# Patient Record
Sex: Male | Born: 1955 | Race: White | Hispanic: No | Marital: Married | Smoking: Current every day smoker
Health system: Southern US, Community
[De-identification: ages and names within clinical notes are randomized; demographics above are authoritative.]

---

## 1987-01-13 DIAGNOSIS — I219 Acute myocardial infarction, unspecified: Secondary | ICD-10-CM

## 1987-01-13 HISTORY — DX: Acute myocardial infarction, unspecified: I21.9

## 1990-01-12 HISTORY — PX: VASECTOMY: SHX75

## 2017-01-12 HISTORY — PX: GALLBLADDER SURGERY: SHX652

## 2019-01-13 HISTORY — PX: FEMORAL-POPLITEAL BYPASS GRAFT: SHX937

## 2021-07-01 ENCOUNTER — Emergency Department: Payer: BC Managed Care – PPO

## 2021-07-01 ENCOUNTER — Emergency Department
Admission: EM | Admit: 2021-07-01 | Discharge: 2021-07-01 | Disposition: A | Discharge status: Home / Self Care | Payer: BC Managed Care – PPO | Attending: Emergency Medicine | Admitting: Emergency Medicine

## 2021-07-01 ENCOUNTER — Other Ambulatory Visit: Payer: Self-pay

## 2021-07-01 DIAGNOSIS — D649 Anemia, unspecified: Secondary | ICD-10-CM | POA: Diagnosis not present

## 2021-07-01 DIAGNOSIS — R778 Other specified abnormalities of plasma proteins: Secondary | ICD-10-CM | POA: Diagnosis not present

## 2021-07-01 DIAGNOSIS — R0789 Other chest pain: Secondary | ICD-10-CM | POA: Diagnosis not present

## 2021-07-01 DIAGNOSIS — I2510 Atherosclerotic heart disease of native coronary artery without angina pectoris: Secondary | ICD-10-CM | POA: Diagnosis not present

## 2021-07-01 DIAGNOSIS — J449 Chronic obstructive pulmonary disease, unspecified: Secondary | ICD-10-CM | POA: Insufficient documentation

## 2021-07-01 DIAGNOSIS — I509 Heart failure, unspecified: Secondary | ICD-10-CM | POA: Diagnosis not present

## 2021-07-01 DIAGNOSIS — R944 Abnormal results of kidney function studies: Secondary | ICD-10-CM | POA: Diagnosis not present

## 2021-07-01 DIAGNOSIS — I110 Hypertensive heart disease with heart failure: Secondary | ICD-10-CM | POA: Diagnosis not present

## 2021-07-01 LAB — BASIC METABOLIC PANEL
Anion gap: 8 (ref 5–15)
BUN: 12 mg/dL (ref 8–23)
CO2: 25 mmol/L (ref 22–32)
Calcium: 8.9 mg/dL (ref 8.9–10.3)
Chloride: 106 mmol/L (ref 98–111)
Creatinine, Ser: 1.59 mg/dL — ABNORMAL HIGH (ref 0.61–1.24)
GFR, Estimated: 48 mL/min — ABNORMAL LOW (ref >60)
Glucose, Bld: 98 mg/dL (ref 70–99)
Potassium: 4.3 mmol/L (ref 3.5–5.1)
Sodium: 139 mmol/L (ref 135–145)

## 2021-07-01 LAB — CBC
HCT: 34.5 % — ABNORMAL LOW (ref 39.0–52.0)
Hemoglobin: 11.2 g/dL — ABNORMAL LOW (ref 13.0–17.0)
MCH: 33.4 pg (ref 26.0–34.0)
MCHC: 32.5 g/dL (ref 30.0–36.0)
MCV: 103 fL — ABNORMAL HIGH (ref 80.0–100.0)
Platelets: 143 10*3/uL — ABNORMAL LOW (ref 150–400)
RBC: 3.35 MIL/uL — ABNORMAL LOW (ref 4.22–5.81)
RDW: 19 % — ABNORMAL HIGH (ref 11.5–15.5)
WBC: 6.1 10*3/uL (ref 4.0–10.5)
nRBC: 0 % (ref 0.0–0.2)

## 2021-07-01 LAB — TROPONIN I (HIGH SENSITIVITY)
Troponin I (High Sensitivity): 76 ng/L — ABNORMAL HIGH (ref <18)
Troponin I (High Sensitivity): 65 ng/L — ABNORMAL HIGH (ref <18)

## 2021-07-01 NOTE — ED Provider Notes (Signed)
[facility] Provider Note    Event Date/Time   First MD Initiated Contact with Patient 07/01/21 0845     (approximate)   History   Chief Complaint Chest Pain   HPI  Derek Oconnell is a 66 y.o. male with past medical history of hypertension, hyperlipidemia, CAD, CHF, PAD, COPD, and chronic dysphagia who presents to the ED complaining of chest pain.  Patient reports that for each of the past 2 mornings he has woken up with a tightness in the left side of his chest.  He describes the sensation as both tight and painful, rated 5 out of 10 when present.  Yesterday it lasted only for a couple of minutes before resolving, this morning it lasted for about 15 to 20 minutes before resolving.  He denies similar symptoms over the past couple of months, has not had any fevers, cough, or shortness of breath.  He has not noticed any pain or swelling in his legs and denies any abdominal pain, vomiting, or diarrhea.  He follows with [facility] cardiology, reports a recent echocardiogram that was unchanged from previous, denies any recent stenting or other intervention.  He took 324 mg of aspirin prior to arrival.     Physical Exam   Triage Vital Signs: ED Triage Vitals  Enc Vitals Group     BP 07/01/21 0832 113/87     Pulse Rate 07/01/21 0832 91     Resp 07/01/21 0832 18     Temp 07/01/21 0832 98 F (36.7 C)     Temp Source 07/01/21 0832 Oral     SpO2 07/01/21 0832 96 %     Weight 07/01/21 0834 145 lb (65.8 kg)     Height 07/01/21 0834 5\' 10"  (1.778 m)     Head Circumference --      Peak Flow --      Pain Score 07/01/21 0832 0     Pain Loc --      Pain Edu? --      Excl. in GC? --     Most recent vital signs: Vitals:   07/01/21 0832 07/01/21 1048  BP: 113/87 115/87  Pulse: 91 89  Resp: 18 18  Temp: 98 F (36.7 C)   SpO2: 96% 96%    Constitutional: Alert and oriented. Eyes: Conjunctivae are normal. Head: Atraumatic. Nose: No congestion/rhinnorhea. Mouth/Throat:  Mucous membranes are moist.  Cardiovascular: Normal rate, regular rhythm. Grossly normal heart sounds.  2+ radial pulses bilaterally. Respiratory: Normal respiratory effort.  No retractions. Lungs CTAB.  No chest wall tenderness to palpation. Gastrointestinal: Soft and nontender. No distention. Musculoskeletal: No lower extremity tenderness nor edema.  Neurologic:  Normal speech and language. No gross focal neurologic deficits are appreciated.    ED Results / Procedures / Treatments   Labs (all labs ordered are listed, but only abnormal results are displayed) Labs Reviewed  BASIC METABOLIC PANEL - Abnormal; Notable for the following components:      Result Value   Creatinine, Ser 1.59 (*)    GFR, Estimated 48 (*)    All other components within normal limits  CBC - Abnormal; Notable for the following components:   RBC 3.35 (*)    Hemoglobin 11.2 (*)    HCT 34.5 (*)    MCV 103.0 (*)    RDW 19.0 (*)    Platelets 143 (*)    All other components within normal limits  TROPONIN I (HIGH SENSITIVITY) - Abnormal; Notable for the following components:  Troponin I (High Sensitivity) 76 (*)    All other components within normal limits  TROPONIN I (HIGH SENSITIVITY) - Abnormal; Notable for the following components:   Troponin I (High Sensitivity) 65 (*)    All other components within normal limits     EKG  ED ECG REPORT I, Chesley Noon, the attending physician, personally viewed and interpreted this ECG.   Date: 07/01/2021  EKG Time: 8:33  Rate: 91  Rhythm: normal sinus rhythm, occasional PVC  Axis: Normal  Intervals:nonspecific intraventricular conduction delay  ST&T Change: Lateral ST depressions  RADIOLOGY Chest x-ray reviewed and interpreted by me with no infiltrate, edema, or effusion.  PROCEDURES:  Critical Care performed: No  Procedures   MEDICATIONS ORDERED IN ED: Medications - No data to display   IMPRESSION / MDM / ASSESSMENT AND PLAN / ED COURSE  I  reviewed the triage vital signs and the nursing notes.                              66 y.o. male with past medical history of hypertension, hyperlipidemia, CAD, CHF, PAD, COPD, and dysphagia who presents to the ED complaining of chest tightness upon waking up each of the past 2 mornings, symptoms have since resolved.  Patient's presentation is most consistent with acute presentation with potential threat to life or bodily function.  Differential diagnosis includes, but is not limited to, ACS, PE, dissection, pneumonia, pneumothorax, GERD, musculoskeletal pain, anxiety.  Patient well-appearing and in no acute distress, vital signs are unremarkable.  EKG does show slight ST depressions laterally, unfortunately no prior EKG in our system for comparison although EKG from [facility] in January does not note any depressions.  We will observe patient on cardiac monitor and screen 2 sets of troponin, although symptoms are atypical for ACS.  Chest x-ray is unremarkable and I doubt PE or dissection given resolution of his symptoms with reassuring vital signs.  CBC shows chronic anemia that is unchanged today.  Additional labs show mild elevation in troponin as well as mild elevation in creatinine, both comparable to labs obtained earlier this year at [facility].  Patient remains asymptomatic here in the ED and troponin is stable on recheck, low suspicion for ACS given his atypical symptoms.  Patient is appropriate for discharge home, was counseled to closely follow-up with his cardiology team at [facility] and to otherwise return to the ED for new worsening symptoms.  Patient agrees with plan.      FINAL CLINICAL IMPRESSION(S) / ED DIAGNOSES   Final diagnoses:  Atypical chest pain     Rx / DC Orders   ED Discharge Orders     None        Note:  This document was prepared using Dragon voice recognition software and may include unintentional dictation errors.   Chesley Noon, MD 07/01/21 [phone]

## 2021-07-01 NOTE — ED Triage Notes (Signed)
Pt here via [facility] with left side chest tightness this morning. Pt denies SOB, dizziness, or nausea. Pain does not radiate, pt took 324 ASA at home.    Hx: Mi, angioplasty  84 96% RA 1313/76 133-cbg 20G LAC

## 2021-07-01 NOTE — ED Notes (Signed)
Lt green, purple, and blue lab tube sent

## 2022-09-15 ENCOUNTER — Inpatient Hospital Stay (HOSPITAL_COMMUNITY): Payer: Medicare Other

## 2022-09-15 ENCOUNTER — Encounter (HOSPITAL_COMMUNITY): Payer: Self-pay

## 2022-09-15 ENCOUNTER — Emergency Department: Payer: Medicare Other

## 2022-09-15 ENCOUNTER — Inpatient Hospital Stay (HOSPITAL_COMMUNITY)
Admit: 2022-09-15 | Discharge: 2022-09-24 | DRG: 896 | Disposition: A | Discharge status: Rehabilitation Facility | Payer: Medicare Other | Source: Other Acute Inpatient Hospital | Attending: Internal Medicine | Admitting: Internal Medicine

## 2022-09-15 ENCOUNTER — Emergency Department
Admission: EM | Admit: 2022-09-15 | Discharge: 2022-09-15 | Disposition: A | Discharge status: Other Acute Inpatient Hospital | Payer: Medicare Other | Attending: Emergency Medicine | Admitting: Emergency Medicine

## 2022-09-15 ENCOUNTER — Other Ambulatory Visit: Payer: Self-pay

## 2022-09-15 DIAGNOSIS — G40901 Epilepsy, unspecified, not intractable, with status epilepticus: Secondary | ICD-10-CM | POA: Diagnosis present

## 2022-09-15 DIAGNOSIS — I130 Hypertensive heart and chronic kidney disease with heart failure and stage 1 through stage 4 chronic kidney disease, or unspecified chronic kidney disease: Secondary | ICD-10-CM | POA: Diagnosis present

## 2022-09-15 DIAGNOSIS — E519 Thiamine deficiency, unspecified: Secondary | ICD-10-CM | POA: Diagnosis not present

## 2022-09-15 DIAGNOSIS — F17210 Nicotine dependence, cigarettes, uncomplicated: Secondary | ICD-10-CM | POA: Diagnosis present

## 2022-09-15 DIAGNOSIS — G928 Other toxic encephalopathy: Secondary | ICD-10-CM | POA: Diagnosis present

## 2022-09-15 DIAGNOSIS — J440 Chronic obstructive pulmonary disease with acute lower respiratory infection: Secondary | ICD-10-CM | POA: Diagnosis present

## 2022-09-15 DIAGNOSIS — I69398 Other sequelae of cerebral infarction: Secondary | ICD-10-CM | POA: Diagnosis not present

## 2022-09-15 DIAGNOSIS — D539 Nutritional anemia, unspecified: Secondary | ICD-10-CM | POA: Diagnosis present

## 2022-09-15 DIAGNOSIS — R4182 Altered mental status, unspecified: Secondary | ICD-10-CM | POA: Diagnosis not present

## 2022-09-15 DIAGNOSIS — Z95810 Presence of automatic (implantable) cardiac defibrillator: Secondary | ICD-10-CM

## 2022-09-15 DIAGNOSIS — Z7982 Long term (current) use of aspirin: Secondary | ICD-10-CM

## 2022-09-15 DIAGNOSIS — I5042 Chronic combined systolic (congestive) and diastolic (congestive) heart failure: Secondary | ICD-10-CM | POA: Diagnosis present

## 2022-09-15 DIAGNOSIS — G9341 Metabolic encephalopathy: Secondary | ICD-10-CM | POA: Diagnosis present

## 2022-09-15 DIAGNOSIS — I69391 Dysphagia following cerebral infarction: Secondary | ICD-10-CM | POA: Diagnosis not present

## 2022-09-15 DIAGNOSIS — E872 Acidosis, unspecified: Secondary | ICD-10-CM | POA: Diagnosis present

## 2022-09-15 DIAGNOSIS — Z681 Body mass index (BMI) 19 or less, adult: Secondary | ICD-10-CM

## 2022-09-15 DIAGNOSIS — L89891 Pressure ulcer of other site, stage 1: Secondary | ICD-10-CM | POA: Diagnosis present

## 2022-09-15 DIAGNOSIS — I639 Cerebral infarction, unspecified: Secondary | ICD-10-CM | POA: Diagnosis not present

## 2022-09-15 DIAGNOSIS — L89151 Pressure ulcer of sacral region, stage 1: Secondary | ICD-10-CM | POA: Insufficient documentation

## 2022-09-15 DIAGNOSIS — J189 Pneumonia, unspecified organism: Secondary | ICD-10-CM | POA: Diagnosis present

## 2022-09-15 DIAGNOSIS — G4089 Other seizures: Secondary | ICD-10-CM | POA: Diagnosis present

## 2022-09-15 DIAGNOSIS — G312 Degeneration of nervous system due to alcohol: Secondary | ICD-10-CM | POA: Diagnosis present

## 2022-09-15 DIAGNOSIS — R471 Dysarthria and anarthria: Secondary | ICD-10-CM | POA: Diagnosis not present

## 2022-09-15 DIAGNOSIS — J690 Pneumonitis due to inhalation of food and vomit: Secondary | ICD-10-CM | POA: Diagnosis not present

## 2022-09-15 DIAGNOSIS — R6521 Severe sepsis with septic shock: Secondary | ICD-10-CM | POA: Diagnosis not present

## 2022-09-15 DIAGNOSIS — R1312 Dysphagia, oropharyngeal phase: Secondary | ICD-10-CM | POA: Diagnosis not present

## 2022-09-15 DIAGNOSIS — E440 Moderate protein-calorie malnutrition: Secondary | ICD-10-CM

## 2022-09-15 DIAGNOSIS — I2510 Atherosclerotic heart disease of native coronary artery without angina pectoris: Secondary | ICD-10-CM | POA: Diagnosis not present

## 2022-09-15 DIAGNOSIS — I110 Hypertensive heart disease with heart failure: Secondary | ICD-10-CM | POA: Diagnosis not present

## 2022-09-15 DIAGNOSIS — D62 Acute posthemorrhagic anemia: Secondary | ICD-10-CM | POA: Diagnosis present

## 2022-09-15 DIAGNOSIS — J449 Chronic obstructive pulmonary disease, unspecified: Secondary | ICD-10-CM | POA: Diagnosis not present

## 2022-09-15 DIAGNOSIS — I739 Peripheral vascular disease, unspecified: Secondary | ICD-10-CM | POA: Diagnosis not present

## 2022-09-15 DIAGNOSIS — I63311 Cerebral infarction due to thrombosis of right middle cerebral artery: Secondary | ICD-10-CM | POA: Diagnosis not present

## 2022-09-15 DIAGNOSIS — N1831 Chronic kidney disease, stage 3a: Secondary | ICD-10-CM | POA: Diagnosis present

## 2022-09-15 DIAGNOSIS — E876 Hypokalemia: Secondary | ICD-10-CM | POA: Diagnosis present

## 2022-09-15 DIAGNOSIS — E875 Hyperkalemia: Secondary | ICD-10-CM | POA: Diagnosis present

## 2022-09-15 DIAGNOSIS — F1020 Alcohol dependence, uncomplicated: Secondary | ICD-10-CM | POA: Diagnosis not present

## 2022-09-15 DIAGNOSIS — I252 Old myocardial infarction: Secondary | ICD-10-CM

## 2022-09-15 DIAGNOSIS — Z79899 Other long term (current) drug therapy: Secondary | ICD-10-CM

## 2022-09-15 DIAGNOSIS — E509 Vitamin A deficiency, unspecified: Secondary | ICD-10-CM | POA: Diagnosis present

## 2022-09-15 DIAGNOSIS — I959 Hypotension, unspecified: Secondary | ICD-10-CM | POA: Diagnosis present

## 2022-09-15 DIAGNOSIS — Z1152 Encounter for screening for COVID-19: Secondary | ICD-10-CM | POA: Diagnosis not present

## 2022-09-15 DIAGNOSIS — F10231 Alcohol dependence with withdrawal delirium: Secondary | ICD-10-CM | POA: Diagnosis not present

## 2022-09-15 DIAGNOSIS — D6959 Other secondary thrombocytopenia: Secondary | ICD-10-CM | POA: Diagnosis not present

## 2022-09-15 DIAGNOSIS — F10139 Alcohol abuse with withdrawal, unspecified: Secondary | ICD-10-CM | POA: Diagnosis present

## 2022-09-15 DIAGNOSIS — R64 Cachexia: Secondary | ICD-10-CM | POA: Diagnosis not present

## 2022-09-15 DIAGNOSIS — Z955 Presence of coronary angioplasty implant and graft: Secondary | ICD-10-CM

## 2022-09-15 DIAGNOSIS — D631 Anemia in chronic kidney disease: Secondary | ICD-10-CM | POA: Diagnosis present

## 2022-09-15 DIAGNOSIS — R569 Unspecified convulsions: Secondary | ICD-10-CM | POA: Diagnosis not present

## 2022-09-15 DIAGNOSIS — G9340 Encephalopathy, unspecified: Secondary | ICD-10-CM | POA: Diagnosis not present

## 2022-09-15 DIAGNOSIS — Z8249 Family history of ischemic heart disease and other diseases of the circulatory system: Secondary | ICD-10-CM

## 2022-09-15 DIAGNOSIS — R1314 Dysphagia, pharyngoesophageal phase: Secondary | ICD-10-CM | POA: Diagnosis not present

## 2022-09-15 DIAGNOSIS — R490 Dysphonia: Secondary | ICD-10-CM | POA: Diagnosis not present

## 2022-09-15 DIAGNOSIS — A419 Sepsis, unspecified organism: Secondary | ICD-10-CM | POA: Diagnosis not present

## 2022-09-15 DIAGNOSIS — L89316 Pressure-induced deep tissue damage of right buttock: Secondary | ICD-10-CM | POA: Diagnosis present

## 2022-09-15 DIAGNOSIS — R5381 Other malaise: Secondary | ICD-10-CM

## 2022-09-15 DIAGNOSIS — E1122 Type 2 diabetes mellitus with diabetic chronic kidney disease: Secondary | ICD-10-CM | POA: Diagnosis present

## 2022-09-15 DIAGNOSIS — E785 Hyperlipidemia, unspecified: Secondary | ICD-10-CM | POA: Diagnosis present

## 2022-09-15 DIAGNOSIS — K529 Noninfective gastroenteritis and colitis, unspecified: Secondary | ICD-10-CM | POA: Diagnosis present

## 2022-09-15 DIAGNOSIS — N178 Other acute kidney failure: Secondary | ICD-10-CM | POA: Diagnosis present

## 2022-09-15 DIAGNOSIS — N179 Acute kidney failure, unspecified: Secondary | ICD-10-CM | POA: Diagnosis present

## 2022-09-15 DIAGNOSIS — E538 Deficiency of other specified B group vitamins: Secondary | ICD-10-CM | POA: Diagnosis present

## 2022-09-15 DIAGNOSIS — J9601 Acute respiratory failure with hypoxia: Secondary | ICD-10-CM | POA: Diagnosis present

## 2022-09-15 DIAGNOSIS — Z825 Family history of asthma and other chronic lower respiratory diseases: Secondary | ICD-10-CM

## 2022-09-15 DIAGNOSIS — K224 Dyskinesia of esophagus: Secondary | ICD-10-CM | POA: Diagnosis not present

## 2022-09-15 DIAGNOSIS — I69319 Unspecified symptoms and signs involving cognitive functions following cerebral infarction: Secondary | ICD-10-CM | POA: Diagnosis not present

## 2022-09-15 DIAGNOSIS — I9589 Other hypotension: Secondary | ICD-10-CM | POA: Diagnosis present

## 2022-09-15 DIAGNOSIS — T4275XA Adverse effect of unspecified antiepileptic and sedative-hypnotic drugs, initial encounter: Secondary | ICD-10-CM | POA: Diagnosis present

## 2022-09-15 DIAGNOSIS — Z7902 Long term (current) use of antithrombotics/antiplatelets: Secondary | ICD-10-CM

## 2022-09-15 DIAGNOSIS — D72829 Elevated white blood cell count, unspecified: Secondary | ICD-10-CM | POA: Insufficient documentation

## 2022-09-15 DIAGNOSIS — Z8052 Family history of malignant neoplasm of bladder: Secondary | ICD-10-CM

## 2022-09-15 DIAGNOSIS — R159 Full incontinence of feces: Secondary | ICD-10-CM | POA: Diagnosis present

## 2022-09-15 DIAGNOSIS — E861 Hypovolemia: Secondary | ICD-10-CM | POA: Diagnosis present

## 2022-09-15 DIAGNOSIS — R1310 Dysphagia, unspecified: Secondary | ICD-10-CM | POA: Diagnosis present

## 2022-09-15 DIAGNOSIS — R680 Hypothermia, not associated with low environmental temperature: Secondary | ICD-10-CM | POA: Diagnosis present

## 2022-09-15 DIAGNOSIS — R54 Age-related physical debility: Secondary | ICD-10-CM | POA: Diagnosis present

## 2022-09-15 DIAGNOSIS — I5022 Chronic systolic (congestive) heart failure: Secondary | ICD-10-CM | POA: Diagnosis not present

## 2022-09-15 DIAGNOSIS — E612 Magnesium deficiency: Secondary | ICD-10-CM | POA: Diagnosis present

## 2022-09-15 DIAGNOSIS — D696 Thrombocytopenia, unspecified: Secondary | ICD-10-CM | POA: Diagnosis present

## 2022-09-15 DIAGNOSIS — E43 Unspecified severe protein-calorie malnutrition: Secondary | ICD-10-CM | POA: Diagnosis present

## 2022-09-15 LAB — BASIC METABOLIC PANEL
Anion gap: 26 — ABNORMAL HIGH (ref 5–15)
Anion gap: 15 (ref 5–15)
BUN: 19 mg/dL (ref 8–23)
BUN: 16 mg/dL (ref 8–23)
CO2: 9 mmol/L — ABNORMAL LOW (ref 22–32)
CO2: 15 mmol/L — ABNORMAL LOW (ref 22–32)
Calcium: 5.1 mg/dL — CL (ref 8.9–10.3)
Calcium: 4.6 mg/dL — CL (ref 8.9–10.3)
Chloride: 105 mmol/L (ref 98–111)
Chloride: 106 mmol/L (ref 98–111)
Creatinine, Ser: 2.16 mg/dL — ABNORMAL HIGH (ref 0.61–1.24)
Creatinine, Ser: 1.81 mg/dL — ABNORMAL HIGH (ref 0.61–1.24)
GFR, Estimated: 33 mL/min — ABNORMAL LOW (ref >60)
GFR, Estimated: 41 mL/min — ABNORMAL LOW (ref >60)
Glucose, Bld: 107 mg/dL — ABNORMAL HIGH (ref 70–99)
Glucose, Bld: 159 mg/dL — ABNORMAL HIGH (ref 70–99)
Potassium: 2.7 mmol/L — CL (ref 3.5–5.1)
Potassium: 2 mmol/L — CL (ref 3.5–5.1)
Sodium: 140 mmol/L (ref 135–145)
Sodium: 136 mmol/L (ref 135–145)

## 2022-09-15 LAB — CBC
HCT: 33.2 % — ABNORMAL LOW (ref 39.0–52.0)
Hemoglobin: 10.8 g/dL — ABNORMAL LOW (ref 13.0–17.0)
MCH: 33.4 pg (ref 26.0–34.0)
MCHC: 32.5 g/dL (ref 30.0–36.0)
MCV: 102.8 fL — ABNORMAL HIGH (ref 80.0–100.0)
Platelets: 90 10*3/uL — ABNORMAL LOW (ref 150–400)
RBC: 3.23 MIL/uL — ABNORMAL LOW (ref 4.22–5.81)
RDW: 16.3 % — ABNORMAL HIGH (ref 11.5–15.5)
WBC: 12.2 10*3/uL — ABNORMAL HIGH (ref 4.0–10.5)
nRBC: 0.2 % (ref 0.0–0.2)

## 2022-09-15 LAB — HEPATIC FUNCTION PANEL
ALT: 8 U/L (ref 0–44)
AST: 23 U/L (ref 15–41)
Albumin: 1.7 g/dL — ABNORMAL LOW (ref 3.5–5.0)
Alkaline Phosphatase: 89 U/L (ref 38–126)
Bilirubin, Direct: 0.2 mg/dL (ref 0.0–0.2)
Indirect Bilirubin: 0.5 mg/dL (ref 0.3–0.9)
Total Bilirubin: 0.7 mg/dL (ref 0.3–1.2)
Total Protein: 4.5 g/dL — ABNORMAL LOW (ref 6.5–8.1)

## 2022-09-15 LAB — URINALYSIS, ROUTINE W REFLEX MICROSCOPIC
Bilirubin Urine: NEGATIVE
Glucose, UA: NEGATIVE mg/dL
Hgb urine dipstick: NEGATIVE
Ketones, ur: NEGATIVE mg/dL
Leukocytes,Ua: NEGATIVE
Nitrite: NEGATIVE
Protein, ur: 30 mg/dL — AB
Specific Gravity, Urine: 1.018 (ref 1.005–1.030)
pH: 5 (ref 5.0–8.0)

## 2022-09-15 LAB — URINE DRUG SCREEN, QUALITATIVE (ARMC ONLY)
Amphetamines, Ur Screen: NOT DETECTED
Barbiturates, Ur Screen: NOT DETECTED
Benzodiazepine, Ur Scrn: NOT DETECTED
Cannabinoid 50 Ng, Ur ~~LOC~~: NOT DETECTED
Cocaine Metabolite,Ur ~~LOC~~: NOT DETECTED
MDMA (Ecstasy)Ur Screen: NOT DETECTED
Methadone Scn, Ur: NOT DETECTED
Opiate, Ur Screen: NOT DETECTED
Phencyclidine (PCP) Ur S: NOT DETECTED
Tricyclic, Ur Screen: NOT DETECTED

## 2022-09-15 LAB — RESP PANEL BY RT-PCR (RSV, FLU A&B, COVID)  RVPGX2
Influenza A by PCR: NEGATIVE
Influenza B by PCR: NEGATIVE
Resp Syncytial Virus by PCR: NEGATIVE
SARS Coronavirus 2 by RT PCR: NEGATIVE

## 2022-09-15 LAB — BLOOD GAS, ARTERIAL
Acid-base deficit: 13.9 mmol/L — ABNORMAL HIGH (ref 0.0–2.0)
Bicarbonate: 11.5 mmol/L — ABNORMAL LOW (ref 20.0–28.0)
FIO2: 0.3 %
MECHVT: 420 mL
O2 Saturation: 98.7 %
PEEP: 5 cmH2O
Patient temperature: 34.7
RATE: 16 {breaths}/min
pCO2 arterial: 23 mmHg — ABNORMAL LOW (ref 32–48)
pH, Arterial: 7.3 — ABNORMAL LOW (ref 7.35–7.45)
pO2, Arterial: 107 mmHg (ref 83–108)

## 2022-09-15 LAB — GLUCOSE, CAPILLARY
Glucose-Capillary: 141 mg/dL — ABNORMAL HIGH (ref 70–99)
Glucose-Capillary: 166 mg/dL — ABNORMAL HIGH (ref 70–99)

## 2022-09-15 LAB — T4, FREE: Free T4: 1.71 ng/dL — ABNORMAL HIGH (ref 0.61–1.12)

## 2022-09-15 LAB — LIPASE, BLOOD: Lipase: 38 U/L (ref 11–51)

## 2022-09-15 LAB — TSH: TSH: 4.847 u[IU]/mL — ABNORMAL HIGH (ref 0.350–4.500)

## 2022-09-15 LAB — MRSA NEXT GEN BY PCR, NASAL: MRSA by PCR Next Gen: NOT DETECTED

## 2022-09-15 LAB — ACETAMINOPHEN LEVEL: Acetaminophen (Tylenol), Serum: <10 ug/mL — ABNORMAL LOW (ref 10–30)

## 2022-09-15 LAB — LACTIC ACID, PLASMA
Lactic Acid, Venous: >9 mmol/L (ref 0.5–1.9)
Lactic Acid, Venous: 4 mmol/L (ref 0.5–1.9)
Lactic Acid, Venous: 1 mmol/L (ref 0.5–1.9)

## 2022-09-15 LAB — MAGNESIUM: Magnesium: 1.2 mg/dL — ABNORMAL LOW (ref 1.7–2.4)

## 2022-09-15 LAB — TROPONIN I (HIGH SENSITIVITY): Troponin I (High Sensitivity): 58 ng/L — ABNORMAL HIGH (ref <18)

## 2022-09-15 LAB — PHOSPHORUS: Phosphorus: 3.7 mg/dL (ref 2.5–4.6)

## 2022-09-15 LAB — SALICYLATE LEVEL: Salicylate Lvl: <7 mg/dL — ABNORMAL LOW (ref 7.0–30.0)

## 2022-09-15 MED ORDER — NOREPINEPHRINE 4 MG/250ML-% IV SOLN
INTRAVENOUS | Status: AC
  Filled 2022-09-15: qty 250

## 2022-09-15 MED ORDER — MAGNESIUM SULFATE 4 GM/100ML IV SOLN
4.0000 g | Freq: Once | INTRAVENOUS | Status: AC
  Administered 2022-09-15: 4 g via INTRAVENOUS
  Filled 2022-09-15: qty 100

## 2022-09-15 MED ORDER — DOCUSATE SODIUM 50 MG/5ML PO LIQD: 100.0000 mg | Freq: Two times a day (BID) | ORAL | Status: DC | PRN

## 2022-09-15 MED ORDER — SODIUM CHLORIDE 0.9 % IV SOLN
250.0000 mL | INTRAVENOUS | Status: DC
  Administered 2022-09-15 – 2022-09-23 (×3): 250 mL via INTRAVENOUS

## 2022-09-15 MED ORDER — CALCIUM GLUCONATE-NACL 2-0.675 GM/100ML-% IV SOLN
2.0000 g | Freq: Once | INTRAVENOUS | Status: AC
  Administered 2022-09-15: 2000 mg via INTRAVENOUS
  Filled 2022-09-15: qty 100

## 2022-09-15 MED ORDER — ALBUMIN HUMAN 25 % IV SOLN
25.0000 g | Freq: Four times a day (QID) | INTRAVENOUS | Status: AC
  Administered 2022-09-15 – 2022-09-16 (×3): 25 g via INTRAVENOUS
  Filled 2022-09-15 (×3): qty 100

## 2022-09-15 MED ORDER — SUCCINYLCHOLINE CHLORIDE 200 MG/10ML IV SOSY
PREFILLED_SYRINGE | INTRAVENOUS | Status: AC
  Administered 2022-09-15: 80 mg via INTRAVENOUS
  Filled 2022-09-15: qty 10

## 2022-09-15 MED ORDER — FOLIC ACID 5 MG/ML IJ SOLN
1.0000 mg | Freq: Every day | INTRAMUSCULAR | Status: AC
  Administered 2022-09-16: 1 mg via INTRAVENOUS
  Filled 2022-09-15: qty 0.2

## 2022-09-15 MED ORDER — CHLORHEXIDINE GLUCONATE CLOTH 2 % EX PADS
6.0000 | MEDICATED_PAD | Freq: Every day | CUTANEOUS | Status: DC
  Administered 2022-09-15 – 2022-09-18 (×5): 6 via TOPICAL

## 2022-09-15 MED ORDER — ORAL CARE MOUTH RINSE: 15.0000 mL | OROMUCOSAL | Status: DC | PRN

## 2022-09-15 MED ORDER — SODIUM CHLORIDE 0.9 % IV SOLN
2000.0000 mg | Freq: Once | INTRAVENOUS | Status: AC
  Administered 2022-09-15: 2000 mg via INTRAVENOUS
  Filled 2022-09-15: qty 20

## 2022-09-15 MED ORDER — SODIUM CHLORIDE 0.9 % IV BOLUS
1000.0000 mL | Freq: Once | INTRAVENOUS | Status: AC
  Administered 2022-09-15: 1000 mL via INTRAVENOUS

## 2022-09-15 MED ORDER — CLOPIDOGREL BISULFATE 75 MG PO TABS
75.0000 mg | ORAL_TABLET | Freq: Every day | ORAL | Status: DC
  Administered 2022-09-16 – 2022-09-19 (×4): 75 mg
  Filled 2022-09-15 (×4): qty 1

## 2022-09-15 MED ORDER — NOREPINEPHRINE 4 MG/250ML-% IV SOLN
2.0000 ug/min | INTRAVENOUS | Status: DC
  Administered 2022-09-15 – 2022-09-16 (×3): 6 ug/min via INTRAVENOUS
  Administered 2022-09-17: 7 ug/min via INTRAVENOUS
  Administered 2022-09-17: 6 ug/min via INTRAVENOUS
  Administered 2022-09-19: 4 ug/min via INTRAVENOUS
  Administered 2022-09-19: 2 ug/min via INTRAVENOUS
  Filled 2022-09-15 (×7): qty 250

## 2022-09-15 MED ORDER — PANTOPRAZOLE SODIUM 40 MG IV SOLR
40.0000 mg | Freq: Every day | INTRAVENOUS | Status: DC
  Administered 2022-09-15 – 2022-09-23 (×9): 40 mg via INTRAVENOUS
  Filled 2022-09-15 (×9): qty 10

## 2022-09-15 MED ORDER — MAGNESIUM SULFATE 2 GM/50ML IV SOLN
2.0000 g | INTRAVENOUS | Status: AC
  Administered 2022-09-15: 2 g via INTRAVENOUS
  Filled 2022-09-15: qty 50

## 2022-09-15 MED ORDER — ORAL CARE MOUTH RINSE
15.0000 mL | OROMUCOSAL | Status: DC
  Administered 2022-09-15 – 2022-09-17 (×23): 15 mL via OROMUCOSAL

## 2022-09-15 MED ORDER — LEVETIRACETAM IN NACL 1000 MG/100ML IV SOLN
1000.0000 mg | INTRAVENOUS | Status: AC
  Administered 2022-09-15: 1000 mg via INTRAVENOUS
  Filled 2022-09-15: qty 100

## 2022-09-15 MED ORDER — MIDAZOLAM-SODIUM CHLORIDE 100-0.9 MG/100ML-% IV SOLN
0.5000 mg/h | INTRAVENOUS | Status: DC
  Administered 2022-09-15: 2 mg/h via INTRAVENOUS
  Filled 2022-09-15: qty 100

## 2022-09-15 MED ORDER — VANCOMYCIN HCL IN DEXTROSE 1-5 GM/200ML-% IV SOLN
1000.0000 mg | Freq: Once | INTRAVENOUS | Status: AC
  Administered 2022-09-15: 1000 mg via INTRAVENOUS
  Filled 2022-09-15: qty 200

## 2022-09-15 MED ORDER — LACTATED RINGERS IV BOLUS
1000.0000 mL | Freq: Once | INTRAVENOUS | Status: AC
  Administered 2022-09-15: 1000 mL via INTRAVENOUS

## 2022-09-15 MED ORDER — FENTANYL 2500MCG IN NS 250ML (10MCG/ML) PREMIX INFUSION
INTRAVENOUS | Status: AC
  Filled 2022-09-15: qty 250

## 2022-09-15 MED ORDER — SUCCINYLCHOLINE CHLORIDE 200 MG/10ML IV SOSY: 80.0000 mg | PREFILLED_SYRINGE | Freq: Once | INTRAVENOUS | Status: AC

## 2022-09-15 MED ORDER — METRONIDAZOLE 500 MG/100ML IV SOLN
500.0000 mg | Freq: Once | INTRAVENOUS | Status: AC
  Administered 2022-09-15: 500 mg via INTRAVENOUS
  Filled 2022-09-15: qty 100

## 2022-09-15 MED ORDER — ALBUTEROL SULFATE (2.5 MG/3ML) 0.083% IN NEBU: 2.5000 mg | INHALATION_SOLUTION | RESPIRATORY_TRACT | Status: DC | PRN

## 2022-09-15 MED ORDER — KETAMINE HCL 50 MG/5ML IJ SOSY
100.0000 mg | PREFILLED_SYRINGE | Freq: Once | INTRAMUSCULAR | Status: AC
  Administered 2022-09-15: 100 mg via INTRAVENOUS

## 2022-09-15 MED ORDER — NOREPINEPHRINE 4 MG/250ML-% IV SOLN
0.0000 ug/min | INTRAVENOUS | Status: DC
  Administered 2022-09-15: 4 ug/min via INTRAVENOUS

## 2022-09-15 MED ORDER — POTASSIUM CHLORIDE 20 MEQ PO PACK
60.0000 meq | PACK | ORAL | Status: AC
  Administered 2022-09-15 – 2022-09-16 (×3): 60 meq
  Filled 2022-09-15 (×3): qty 3

## 2022-09-15 MED ORDER — CALCIUM GLUCONATE-NACL 1-0.675 GM/50ML-% IV SOLN
1.0000 g | Freq: Once | INTRAVENOUS | Status: AC
  Administered 2022-09-15: 1000 mg via INTRAVENOUS
  Filled 2022-09-15: qty 50

## 2022-09-15 MED ORDER — HEPARIN SODIUM (PORCINE) 5000 UNIT/ML IJ SOLN
5000.0000 [IU] | Freq: Three times a day (TID) | INTRAMUSCULAR | Status: DC
  Administered 2022-09-15 – 2022-09-24 (×26): 5000 [IU] via SUBCUTANEOUS
  Filled 2022-09-15 (×27): qty 1

## 2022-09-15 MED ORDER — THIAMINE HCL 100 MG/ML IJ SOLN: 100.0000 mg | Freq: Every day | INTRAMUSCULAR | Status: DC

## 2022-09-15 MED ORDER — POTASSIUM CHLORIDE 10 MEQ/100ML IV SOLN
10.0000 meq | INTRAVENOUS | Status: AC
  Administered 2022-09-15 (×2): 10 meq via INTRAVENOUS
  Filled 2022-09-15 (×2): qty 100

## 2022-09-15 MED ORDER — LACTATED RINGERS IV SOLN: INTRAVENOUS | Status: DC

## 2022-09-15 MED ORDER — FENTANYL 2500MCG IN NS 250ML (10MCG/ML) PREMIX INFUSION
25.0000 ug/h | INTRAVENOUS | Status: DC
  Administered 2022-09-16: 50 ug/h via INTRAVENOUS
  Filled 2022-09-15: qty 250

## 2022-09-15 MED ORDER — SODIUM CHLORIDE 0.9 % IV SOLN
2.0000 g | Freq: Once | INTRAVENOUS | Status: AC
  Administered 2022-09-15: 2 g via INTRAVENOUS
  Filled 2022-09-15: qty 12.5

## 2022-09-15 MED ORDER — POLYETHYLENE GLYCOL 3350 17 G PO PACK: 17.0000 g | PACK | Freq: Every day | ORAL | Status: DC | PRN

## 2022-09-15 MED ORDER — KETAMINE HCL 10 MG/ML IJ SOLN
INTRAMUSCULAR | Status: AC
  Filled 2022-09-15: qty 1

## 2022-09-15 MED ORDER — FENTANYL BOLUS VIA INFUSION
25.0000 ug | INTRAVENOUS | Status: DC | PRN
  Administered 2022-09-16 – 2022-09-17 (×2): 50 ug via INTRAVENOUS

## 2022-09-15 MED ORDER — DOCUSATE SODIUM 100 MG PO CAPS: 100.0000 mg | ORAL_CAPSULE | Freq: Two times a day (BID) | ORAL | Status: DC | PRN

## 2022-09-15 MED ORDER — MIDAZOLAM-SODIUM CHLORIDE 100-0.9 MG/100ML-% IV SOLN
0.5000 mg/h | INTRAVENOUS | Status: DC
  Administered 2022-09-16: 6 mg/h via INTRAVENOUS
  Filled 2022-09-15: qty 100

## 2022-09-15 MED ORDER — FENTANYL CITRATE PF 50 MCG/ML IJ SOSY: 25.0000 ug | PREFILLED_SYRINGE | Freq: Once | INTRAMUSCULAR | Status: DC

## 2022-09-15 MED ORDER — FENTANYL 2500MCG IN NS 250ML (10MCG/ML) PREMIX INFUSION
0.0000 ug/h | INTRAVENOUS | Status: DC
  Administered 2022-09-15: 50 ug/h via INTRAVENOUS
  Administered 2022-09-15: 25 ug/h via INTRAVENOUS
  Administered 2022-09-15: 50 ug/h via INTRAVENOUS

## 2022-09-15 NOTE — ED Notes (Signed)
Attempted to call spouse At number listed as contact in attempt to get verbal consent for transfer. HIPAA compliant voicemail left

## 2022-09-15 NOTE — ED Notes (Signed)
Ok to titrate fentanyl to 50 mcg/hr, bolus hour fentanyl now per Dr Anner Crete

## 2022-09-15 NOTE — ED Provider Notes (Signed)
.  Critical Care  Performed by: Sharman Cheek, MD Authorized by: Sharman Cheek, MD   Critical care provider statement:    Critical care time (minutes):  45   Critical care time was exclusive of:  Separately billable procedures and treating other patients   Critical care was necessary to treat or prevent imminent or life-threatening deterioration of the following conditions:  CNS failure or compromise, metabolic crisis, dehydration and shock   Critical care was time spent personally by me on the following activities:  Development of treatment plan with patient or surrogate, discussions with consultants, evaluation of patient's response to treatment, examination of patient, obtaining history from patient or surrogate, ordering and performing treatments and interventions, ordering and review of laboratory studies, ordering and review of radiographic studies, pulse oximetry, re-evaluation of patient's condition and review of old charts   Care discussed with: accepting provider at another facility     Clinical Course as of 09/15/22 1732  Tue Sep 15, 2022  1423 Potassium(!!): 2.7 [DW]  1423 Calcium(!!): 5.1 [DW]  1423 Anion gap(!): 26 [DW]  1424 WBC(!): 12.2 [DW]  1432 CBC(!) Mild leukocytosis which could be from seizure activity.  Thrombocytopenia which is decreased from prior episodes [DW]  1515 Urinalysis, Routine w reflex microscopic -Urine, Clean Catch(!) No UTI [DW]    Clinical Course User Index [DW] Janith Lima, MD    ----------------------------------------- 5:32 PM on 09/15/2022 -----------------------------------------  Lactate dramatically improved from greater than 9 to 4.0 on repeat lab draw, again suspicious for presenting seizure. Doubt sepsis. No identifiable infectious source.  CT head unremarkable.  Labs improving with active warming, Levophed, continued fluids.  Oxygenation remains at 100%, heart rate 80, peripheral pulses are normal.  Neurology to bedside now for  assessment.  ----------------------------------------- 5:49 PM on 09/15/2022 ----------------------------------------- Discussed with neuro who recommends continue Versed infusion, loaded with Keppra 3000 mg, transferred to [facility] ICU for continuous EEG monitoring  ----------------------------------------- 6:11 PM on 09/15/2022 ----------------------------------------- Discussed with [facility], ICU Dr. Denese Killings who accepts for transfer.  Final diagnoses:  Seizure (HCC)  Acute encephalopathy  Hypokalemia  Hypocalcemia       Sharman Cheek, MD 09/15/22 [phone]

## 2022-09-15 NOTE — ED Notes (Signed)
XRAY  POWERSHARE  WITH  [facility] 

## 2022-09-15 NOTE — Consult Note (Addendum)
NEURO HOSPITALIST CONSULT NOTE   Requestig physician: Dr. Scotty Court  Reason for Consult: Seizure-like activity and unresponsiveness  History obtained from:  EDP, Wife and Chart     HPI:                                                                                                                                          Derek Oconnell is an 67 y.o. male with a PMHx of PVD, CAD s/p stenting 2 years ago and pacemaker placement in April, also with a history of heavy EtOH use, presenting to the ED via EMS this afternoon after recurrent spells of tonic flexor posturing of BUE with unresponsiveness. The posturing spells were preceded by a 3 day history of malaise, decreased PO intake and diarrhea. Last drink of Bourbon, per wife, was estimated to have been on Friday. He drinks about one 1.5 L bottle of Bourbon every 4-5 days and is a long-time chronic drinker who "nurses" his drinks from approximately the afternoon to late evening/night every day.   The patient was with his wife this afternoon describing his complaints of malaise and diarrhea when he suddenly clutched at his chest, became unresponsive and fell to the ground. Wife gave nitroglycerin x 1 while unresponsive. EMS was called and on arrival they witnessed seizure like activity consisting of arms flexed tonically to chest and unresponsive, lasting for about 1 minute. EMS administered 2.5 mg Versed IV. Vitals per EMS: NRB 10 L on arrival, 100%; CBG 125. Another spell of tonic posturing also lasting about a minute was witnessed by staff in the ED. He was hypothermic on arrival. He also had a lactate > 9. Snoring respirations were then noted. He was intubated and is now sedated on Fentanyl at 50 mcg/hr and Versed at a rate of 6 mg/hr. Had critical labs of potassium 2.7 and calcium 5.1. He has no history of seizures.   Past Medical History:  Diagnosis Date   Myocardial infarction [facility]) 1989    Past Surgical History:  Procedure  Laterality Date   FEMORAL-POPLITEAL BYPASS GRAFT Bilateral 2021   GALLBLADDER SURGERY  2019   VASECTOMY  1992    History reviewed. No pertinent family history.            Social History:  reports that he has been smoking cigarettes. He uses smokeless tobacco. He reports current alcohol use. No history on file for drug use.  No Known Allergies  HOME MEDICATIONS:  No current facility-administered medications on file prior to encounter.   Current Outpatient Medications on File Prior to Encounter  Medication Sig Dispense Refill   buPROPion (WELLBUTRIN XL) 150 MG 24 hr tablet Take 150 mg by mouth daily.     clopidogrel (PLAVIX) 75 MG tablet Take 75 mg by mouth daily.     metoprolol succinate (TOPROL-XL) 50 MG 24 hr tablet Take 50 mg by mouth daily. Take with or immediately following a meal.       ROS:                                                                                                                                       Unable to obtain due to intubation and sedation.    Blood pressure 93/65, pulse 76, temperature (!) 95.8 F (35.4 C), resp. rate (!) 22, height 5\' 10"  (1.778 m), weight 60 kg, SpO2 100%.   General Examination:                                                                                                       Physical Exam HEENT- [place]/AT. No nuchal rigidity.  Lungs- Intubated Extremities- Warm and well-perfused  Neurological Examination Mental Status: Intubated and sedated on fentanyl at a rate of 50 and Versed at a rate of 6 mg/hr. Will shake head weakly to corneal stimulation. Otherwise with no semipurposeful or purposeful movements. Does not respond to voice. No attempts to communicate (sedated). No spontaneous or stimulus-induced eye opening.  Cranial Nerves: II: PERRL, 2 mm >> 1.5 mm and sluggishly reactive. No blink to threat.   III,IV, VI: Eyes are conjugate and at the midline. No forced gaze deviation. Weak doll's eye reflex is present. No nystagmus. V: Weak corneals bilaterally (sedated) VII: Flaccidly symmetric.  VIII: No response to voice IX,X: Intubated XI: Head is midline XII: Unable to assess Motor/Sensory: Flaccid tone x 4. No posturing noted. No withdrawal to noxious x 4.  Deep Tendon Reflexes: Hypoactive x 4 (sedated). Toes upgoing bilaterally.  Cerebellar/Gait: Unable to assess    Lab Results: Basic Metabolic Panel: Recent Labs  Lab 09/15/22 1338  NA 140  K 2.7*  CL 105  CO2 9*  GLUCOSE 107*  BUN 19  CREATININE 2.16*  CALCIUM 5.1*  MG 1.2*    CBC: Recent Labs  Lab 09/15/22 1338  WBC 12.2*  HGB 10.8*  HCT 33.2*  MCV 102.8*  PLT 90*    Cardiac Enzymes: No results for input(s): "CKTOTAL", "  CKMB", "CKMBINDEX", "TROPONINI" in the last 168 hours.  Lipid Panel: No results for input(s): "CHOL", "TRIG", "HDL", "CHOLHDL", "VLDL", "LDLCALC" in the last 168 hours.  Imaging: DG Chest Port 1 View  Result Date: 09/15/2022 CLINICAL DATA:  Airway intubation performed without difficulty. Tube placement verification. EXAM: PORTABLE CHEST 1 VIEW COMPARISON:  07/01/2021 FINDINGS: Endotracheal tube is present with tip measuring 7 cm above the carina. Enteric tube is present with tip projecting over the left upper quadrant consistent with location in the upper stomach. Proximal side hole projects over the lower esophageal region. Advancement is suggested. Heart size and pulmonary vascularity are normal. Lungs are clear. No pleural effusions. No pneumothorax. Mediastinal contours appear intact. Cardiac pacemaker. Calcification of the aorta. Degenerative changes in the spine and shoulders. IMPRESSION: 1. Endotracheal tube tip is above the carina. 2. Enteric tube is somewhat shallow with tip over the upper stomach but proximal side hole over the distal esophagus. Consider advancement. 3. Lungs are clear.  Electronically Signed   By: Burman Nieves M.D.   On: 09/15/2022 15:40   CT HEAD WO CONTRAST ( )  Result Date: 09/15/2022 CLINICAL DATA:  Neck trauma (Age >= 65y); Head trauma, minor (Age >= 65y) EXAM: CT HEAD WITHOUT CONTRAST CT CERVICAL SPINE WITHOUT CONTRAST TECHNIQUE: Multidetector CT imaging of the head and cervical spine was performed following the standard protocol without intravenous contrast. Multiplanar CT image reconstructions of the cervical spine were also generated. RADIATION DOSE REDUCTION: This exam was performed according to the departmental dose-optimization program which includes automated exposure control, adjustment of the mA and/or kV according to patient size and/or use of iterative reconstruction technique. COMPARISON:  None Available. FINDINGS: CT HEAD FINDINGS Brain: No evidence of acute infarction, hemorrhage, hydrocephalus, extra-axial collection or mass lesion/mass effect. Vascular: No hyperdense vessel or unexpected calcification. Skull: Mild soft tissue swelling along the left frontal scalp. No evidence of an underlying calvarial fracture. Sinuses/Orbits: No middle ear or mastoid effusion. Complete opacification of the left maxillary sinus with osseous changes suggestive of chronic left maxillary sinusitis. Orbits are unremarkable. Other: None. CT CERVICAL SPINE FINDINGS Alignment: Grade 1 anterolisthesis of C7 on T1. Skull base and vertebrae: No acute fracture. No primary bone lesion or focal pathologic process. Soft tissues and spinal canal: No prevertebral fluid or swelling. No visible canal hematoma. Disc levels:  No evidence of high-grade spinal canal stenosis. Upper chest: Negative. Other: None IMPRESSION: 1. No acute intracranial abnormality. 2. Mild soft tissue swelling along the left frontal scalp. No evidence of an underlying calvarial fracture. 3. No acute fracture or traumatic subluxation of the cervical spine. Electronically Signed   By: Lorenza Cambridge M.D.   On:  09/15/2022 15:26   CT Cervical Spine Wo Contrast  Result Date: 09/15/2022 CLINICAL DATA:  Neck trauma (Age >= 65y); Head trauma, minor (Age >= 65y) EXAM: CT HEAD WITHOUT CONTRAST CT CERVICAL SPINE WITHOUT CONTRAST TECHNIQUE: Multidetector CT imaging of the head and cervical spine was performed following the standard protocol without intravenous contrast. Multiplanar CT image reconstructions of the cervical spine were also generated. RADIATION DOSE REDUCTION: This exam was performed according to the departmental dose-optimization program which includes automated exposure control, adjustment of the mA and/or kV according to patient size and/or use of iterative reconstruction technique. COMPARISON:  None Available. FINDINGS: CT HEAD FINDINGS Brain: No evidence of acute infarction, hemorrhage, hydrocephalus, extra-axial collection or mass lesion/mass effect. Vascular: No hyperdense vessel or unexpected calcification. Skull: Mild soft tissue swelling along the left frontal scalp. No evidence  of an underlying calvarial fracture. Sinuses/Orbits: No middle ear or mastoid effusion. Complete opacification of the left maxillary sinus with osseous changes suggestive of chronic left maxillary sinusitis. Orbits are unremarkable. Other: None. CT CERVICAL SPINE FINDINGS Alignment: Grade 1 anterolisthesis of C7 on T1. Skull base and vertebrae: No acute fracture. No primary bone lesion or focal pathologic process. Soft tissues and spinal canal: No prevertebral fluid or swelling. No visible canal hematoma. Disc levels:  No evidence of high-grade spinal canal stenosis. Upper chest: Negative. Other: None IMPRESSION: 1. No acute intracranial abnormality. 2. Mild soft tissue swelling along the left frontal scalp. No evidence of an underlying calvarial fracture. 3. No acute fracture or traumatic subluxation of the cervical spine. Electronically Signed   By: Lorenza Cambridge M.D.   On: 09/15/2022 15:26    Assessment: 67 year old male with  a history of heavy EtOH use, presenting to the ED after recurrent spells of tonic flexor posturing of BUE with unresponsiveness. The posturing spells were preceded by a 3 day history of malaise, decreased PO intake and diarrhea. Last drink of Bourbon, per wife, was estimated to have been on Friday. He drinks about one 1.5 L bottle of Bourbon every 4-5 days and is a long-time chronic drinker who "nurses" his drinks from approximately the afternoon to late evening/night every day. He has no history of seizures.  - Neurological exam under Versed and fentanyl sedation reveals no clinical seizure activity or posturing. Weak corneals present and pupils are sluggishly reactive. Eyes are conjugate at the midline without nystagmus. Weak doll's eye reflex is present. No nuchal rigidity.  - CT head and neck: No acute intracranial abnormality. Mild soft tissue swelling along the left frontal scalp. No evidence of an underlying calvarial fracture. No acute fracture or traumatic subluxation of the cervical spine. - Labs: Significant hypocalcemia, hypomagnesemia and hypokalemia. Elevated WBC. Low platelets.  - DDx for presentation includes EtOH withdrawal seizures in the setting of reduced seizure threshold due to low Ca level, versus severe toxic/metabolic encephalopathy. New onset seizure unrelated to EtOH withdrawal is lower on the DDx. Severe transient hypotension with bilateral watershed strokes is possible, but significantly lower on the DDx. Given low probability of embolic or thrombotic stroke, he is not a candidate for TNK based on risk/benefit profile.    Recommendations: - LTM EEG STAT. Will require transfer to [facility] for this. Will need admission to ICU under the CCM service.  - Loading with Keppra 3000 mg IV x 1 now. Hold off on scheduled dosing until after EEG results at [facility] - Continue Versed gtt at 6 mg/hr - Seizure precautions - CIWA protocol - MRI brain when stable.   50 minutes spent in the emergent  neurological evaluation and management of this critically ill patient.   Electronically signed: Dr. Caryl Pina 09/15/2022, 5:45 PM

## 2022-09-15 NOTE — Progress Notes (Signed)
An USGPIV (ultrasound guided PIV) has been placed for short-term vasopressor infusion. A correctly placed ivWatch must be used when administering Vasopressors. Should this treatment be needed beyond 24 hours, central line access should be obtained.  It will be the responsibility of the bedside nurse to follow best practice to prevent extravasations.  PIV started by Dulcy Fanny RN.

## 2022-09-15 NOTE — ED Notes (Signed)
Pharmacy contacted for keppra 

## 2022-09-15 NOTE — ED Notes (Signed)
Dr Scotty Court notified of lactic >9. Orders to be placed as needed

## 2022-09-15 NOTE — ED Notes (Signed)
.  EMTALA: REQUIRED DOCUMENTATION COMPLETED AND REVIEWED BY WRITER PRIOR TO PT TRANSFER MD REASSESSMENT EMTALA RN SECTION TRANSFER E-SIGN VS WITHIN REQUIRED TIME   ** VERBAL CONSENT PROVIDED BY PTS SPOUSE OVER PHONE DUE TO INABILITY TO BE PRESENT AS WELL AS NO OTHER IN ED.

## 2022-09-15 NOTE — ED Notes (Signed)
[facility]  CALLED  PER  DR  Joni Fears MD

## 2022-09-15 NOTE — ED Notes (Signed)
Dr Anner Crete made aware of critical labs potassium 2.7. calcium 5.1. orders to be placed as needed

## 2022-09-15 NOTE — ED Notes (Signed)
Attempted to contact spouse for verbal consent x2.

## 2022-09-15 NOTE — ED Notes (Signed)
Pt with snoring respirations. Not responsive. Dr Anner Crete at bedside

## 2022-09-15 NOTE — ED Notes (Signed)
7.5 , +color change, 23 at the lip

## 2022-09-15 NOTE — ED Notes (Signed)
Neuro at bedside.

## 2022-09-15 NOTE — ED Triage Notes (Signed)
Pt to ED [facility] from home for unresponsive.  Wife witnessed pt clutch chest and fall to ground, wife gave nitroglycerin x1 while pt unresponsive. Ems reports pt had torsades rhythm while pt had seizure like activity, gave 2.5 versed IV.  20 g to R FA NRB 10 L on arrival, 100% Cbg 125 Pacemaker placed in April Stent placed 2 years ago

## 2022-09-15 NOTE — Consult Note (Signed)
PHARMACY - BRIEF ANTIBIOTIC NOTE   Pharmacy has received consult(s) for cefepime and vancomycin from an ED provider. The patient's profile has been reviewed for ht/wt/allergies/indication/available labs.    One time order(s) placed for: cefepime 2 g and vancomycin 1000 mg  Further antibiotics/pharmacy consults should be ordered by admitting physician if indicated.                       Thank you,  Will M. Dareen Piano, PharmD Clinical Pharmacist 09/15/2022 4:00 PM

## 2022-09-15 NOTE — ED Notes (Signed)
Called to [facility] Transfer center @1941  spoke to Rep Selena Batten to cancel request for transfer to notify of patient going to [facility].

## 2022-09-15 NOTE — ED Notes (Signed)
Received call back from spouse Santina Evans, gave verbal consent to transfer to [facility], verified by [employer]

## 2022-09-15 NOTE — Progress Notes (Signed)
LTM EEG running - no initial skin breakdown - push button tested - neuro notified.  

## 2022-09-15 NOTE — ED Triage Notes (Signed)
2.5mg  versed IV given now by EMS, verbal Dr Lonia Mad

## 2022-09-15 NOTE — ED Notes (Signed)
Report to [facility] at [facility]

## 2022-09-15 NOTE — ED Notes (Signed)
Bair hugger applied.

## 2022-09-15 NOTE — H&P (Signed)
NAME:  Derek Oconnell, MRN:  [id number], DOB:  23-Feb-1955, LOS: 0 ADMISSION DATE:  09/15/2022, CONSULTATION DATE:  09/15/22 REFERRING MD:  [facility] CHIEF COMPLAINT:  Seizures   History of Present Illness:  Derek Oconnell is a 67 y.o. male who has a PMH of HTN, sCHF, CAD s/p stenting and PPM, COPD, EtOH dependence He presented to [facility] 9/3 with AMS and seizure like activity. This was preceded by 3 days of malaise, decreased PO intake, and diarrhea. He apparently drinks roughly one 1.5L bourbon every 4-5 days and reportedly "nurses" his drinks from afternoon till late evening/night daily.  In ED, he had persistent AMS along with additional seizure like activity. He was ultimately intubated and sedated.   He was later transferred to [facility] for further evaluation and management including LTM.  Pertinent  Medical History:  does not have a problem list on file.  Significant Hospital Events: Including procedures, antibiotic start and stop dates in addition to other pertinent events   9/3 admit.  Interim History / Subjective:  Sedated on vent.  Objective:  Pulse 76, resp. rate 16, height 5\' 10"  (1.778 m), SpO2 96%.    Vent Mode: PRVC FiO2 (%):  [30 %] 30 % Set Rate:  [16 bmp] 16 bmp Vt Set:  [420 mL] 420 mL PEEP:  [5 cmH20] 5 cmH20 Plateau Pressure:  [14 cmH20] 14 cmH20  No intake or output data in the 24 hours ending 09/15/22 2115 There were no vitals filed for this visit.  Examination: General: Adult male, resting in bed, in NAD. Neuro: Sedated, not responsive. HEENT: [place]/AT. Sclerae anicteric. ETT in place. Cardiovascular: RRR, no M/R/G.  Lungs: Respirations even and unlabored.  CTA bilaterally, No W/R/R. Abdomen: BS x 4, soft, NT/ND.  Musculoskeletal: No gross deformities, no edema.  Skin: Intact, warm, no rashes.  Labs/imaging personally reviewed:  CT head 9/3 > no acute process. MRI brain 9/4 >   Assessment & Plan:   Status epilepticus - no underlying known hx of seizures. Could be 2/2  metabolic derangements vs EtOH withdrawal. CT head negative. - Neurology following, appreciate the assistance. - LTM per neuro. - Continue Keppra and additional AED's per neuro. - Continue Midazolam infusion. - MRI brain when able.  Acute hypoxic respiratory failure - s/p intubation in ED. Hx COPD per report - no PFT's available. - Full vent support. - Bronchial hygiene. - Wean daily as able. - Albuterol PRN. - CXR intermittently.  Hx HTN, CAD, HLD. - Continue PTA Plavix. - Hold PTA Toprol.  Hypokalemia, Hypomagnesemia - s/p repletion in ED. Hypocalcemia. AKI - presumed pre-renal from hypovolemia. AGMA - 2/2 renal failure + lactate in setting seizures. - 1g Ca gluconate now. - 1L LR bolus now then LR at 125/hr. - Repeat lactate and BMP now. - F/u BMP in AM.  Hx EtOH dependence. - Thiamine / Folate. - Defer CIWA protocol for now as he is intubated an on continuous infusion of Midazolam.  Best practice (evaluated daily):  Diet/type: NPO DVT prophylaxis: prophylactic heparin  GI prophylaxis: PPI Lines: N/A Foley:  N/A Code Status:  full code Last date of multidisciplinary goals of care discussion: None available.  Labs   CBC: Recent Labs  Lab 09/15/22 1338  WBC 12.2*  HGB 10.8*  HCT 33.2*  MCV 102.8*  PLT 90*    Basic Metabolic Panel: Recent Labs  Lab 09/15/22 1338 09/15/22 1555  NA 140  --   K 2.7*  --   CL 105  --  CO2 9*  --   GLUCOSE 107*  --   BUN 19  --   CREATININE 2.16*  --   CALCIUM 5.1*  --   MG 1.2*  --   PHOS  --  3.7   GFR: Estimated Creatinine Clearance: 28.5 mL/min (A) (by C-G formula based on SCr of 2.16 mg/dL (H)). Recent Labs  Lab 09/15/22 1338 09/15/22 1555  WBC 12.2*  --   LATICACIDVEN >9.0* 4.0*    Liver Function Tests: Recent Labs  Lab 09/15/22 1555  AST 23  ALT 8  ALKPHOS 89  BILITOT 0.7  PROT 4.5*  ALBUMIN 1.7*   Recent Labs  Lab 09/15/22 1555  LIPASE 38   No results for input(s): "AMMONIA" in the  last 168 hours.  ABG    Component Value Date/Time   PHART 7.3 (L) 09/15/2022 1342   PCO2ART 23 (L) 09/15/2022 1342   PO2ART 107 09/15/2022 1342   HCO3 11.5 (L) 09/15/2022 1342   ACIDBASEDEF 13.9 (H) 09/15/2022 1342   O2SAT 98.7 09/15/2022 1342     Coagulation Profile: No results for input(s): "INR", "PROTIME" in the last 168 hours.  Cardiac Enzymes: No results for input(s): "CKTOTAL", "CKMB", "CKMBINDEX", "TROPONINI" in the last 168 hours.  HbA1C: No results found for: "HGBA1C"  CBG: Recent Labs  Lab 09/15/22 2011  GLUCAP 141*    Review of Systems:   Unable to obtain as pt is encephalopathic.  Past Medical History:  He,  has a past medical history of Myocardial infarction (HCC) (1989).   Surgical History:   Past Surgical History:  Procedure Laterality Date   FEMORAL-POPLITEAL BYPASS GRAFT Bilateral 2021   GALLBLADDER SURGERY  2019   VASECTOMY  1992     Social History:   reports that he has been smoking cigarettes. He uses smokeless tobacco. He reports current alcohol use.   Family History:  His family history is not on file.   Allergies No Known Allergies   Home Medications  Prior to Admission medications   Medication Sig Start Date End Date Taking? Authorizing Provider  buPROPion (WELLBUTRIN XL) 150 MG 24 hr tablet Take 150 mg by mouth daily.    [provider]  clopidogrel (PLAVIX) 75 MG tablet Take 75 mg by mouth daily.    [provider]  metoprolol succinate (TOPROL-XL) 50 MG 24 hr tablet Take 50 mg by mouth daily. Take with or immediately following a meal.    [provider]     Critical care time: 35 min.   Rutherford Guys, PA - [facility] For pager details, please see AMION or use Epic chat  After 1900, please call [facility] for cross coverage needs 09/15/2022, 9:15 PM

## 2022-09-15 NOTE — ED Notes (Signed)
Pt continues to be unresponsive.

## 2022-09-15 NOTE — Progress Notes (Signed)
 [facility]

## 2022-09-15 NOTE — Progress Notes (Addendum)
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 Intubated in the ED and transferred to [facility].  On presentation, the patient is mildly hypothermic, hypotensive, requiring sedation with Versed and fentanyl along with norepinephrine infusion.  Received antiepileptic therapy and broad-spectrum antibiotics  Initial workup with severe hyperkalemia, severe hypomagnesemia, hypocalcemia, elevated creatinine.  Lactic acid is elevated in the setting of anion gap metabolic acidosis and leukocytosis present with macrocytic anemia.  Ct head and c spine unremarkable  eICU Interventions  Seizure precautions EtOH withdrawal precautions.  EEG pending.  Maintain broad-spectrum antibiotics  Aggressive crystalloid and electrolyte repletion with magnesium and potassium  GI prophylaxis and DVT prophylaxis pending. Ground team eval pending    2224 - Added Kcl and additional calcium  Intervention Category Evaluation Type: New Patient Evaluation  Laney Bagshaw 09/15/2022, 8:39 PM

## 2022-09-15 NOTE — ED Provider Notes (Signed)
[facility] Provider Note    Event Date/Time   First MD Initiated Contact with Patient 09/15/22 1349     (approximate)   History   unresponsive   HPI Paxson Stefanovic is a 67 y.o. male with HTN, HFrEF with a EF of 25 to 30%, CAD, COPD, HTN, biventricular ICD placement presenting today for unresponsive episode.  Wife reports that patient has been more confused over the past several days with decreased p.o. intake.  Today got worse and he was having a tough time talking to her and explaining what was going on.  He then was sitting on the couch and got real tense and she thought he was having chest pain issues and gave him a nitro.  Patient then became less responsive and EMS was called.  Per EMS, patient was exhibiting seizure-like activity and was given 2.5 mg of Versed.  Patient was GCS 3 throughout entire transport even after getting Versed.  On arrival to the ED, patient once again with tense bilateral upper and lower extremities and not responsive.  No recent falls.  Has reportedly not been taking all medications as prescribed.     Physical Exam   Triage Vital Signs: ED Triage Vitals  Encounter Vitals Group     BP 09/15/22 1339 98/75     Systolic BP Percentile --      Diastolic BP Percentile --      Pulse Rate 09/15/22 1339 (!) 109     Resp 09/15/22 1339 20     Temp --      Temp src --      SpO2 09/15/22 1345 (!) 82 %     Weight 09/15/22 1353 132 lb 4.4 oz (60 kg)     Height 09/15/22 1353 5\' 10"  (1.778 m)     Head Circumference --      Peak Flow --      Pain Score --      Pain Loc --      Pain Education --      Exclude from Growth Chart --     Most recent vital signs: Vitals:   09/15/22 1651 09/15/22 1657  BP: (!) 87/59 93/65  Pulse: 82 76  Resp: (!) 21 (!) 22  Temp: (!) 95.7 F (35.4 C) (!) 95.8 F (35.4 C)  SpO2: 100% 100%   Physical Exam: I have reviewed the vital signs and nursing notes. General: Unresponsive, chronically frail  appearing.  Exhibiting seizure-like activity Head:  Atraumatic, normocephalic.   ENT: Pupils 4 mm and reactive with a right-sided fixed gaze that on reassessment was midline. Oral mucosa is pink and moist with no lesions. Neck: Neck is supple with full range of motion, No meningeal signs. Cardiovascular:  RRR, No murmurs. Peripheral pulses palpable and equal bilaterally. Respiratory: No rhonchi, rales, or wheezes.  Diminished respiratory rate. Musculoskeletal: Tense extremities throughout Abdomen:  Soft, nontender, nondistended. Neuro: GCS 3, unresponsive.  Right-sided gaze preference.  Bilateral upper and lower extremities tense and exhibiting seizure-like activity. Skin:  Warm, dry, no rash.     ED Results / Procedures / Treatments   Labs (all labs ordered are listed, but only abnormal results are displayed) Labs Reviewed  BLOOD GAS, ARTERIAL - Abnormal; Notable for the following components:      Result Value   pH, Arterial 7.3 (*)    pCO2 arterial 23 (*)    Bicarbonate 11.5 (*)    Acid-base deficit 13.9 (*)    All other  components within normal limits  CBC - Abnormal; Notable for the following components:   WBC 12.2 (*)    RBC 3.23 (*)    Hemoglobin 10.8 (*)    HCT 33.2 (*)    MCV 102.8 (*)    RDW 16.3 (*)    Platelets 90 (*)    All other components within normal limits  BASIC METABOLIC PANEL - Abnormal; Notable for the following components:   Potassium 2.7 (*)    CO2 9 (*)    Glucose, Bld 107 (*)    Creatinine, Ser 2.16 (*)    Calcium 5.1 (*)    GFR, Estimated 33 (*)    Anion gap 26 (*)    All other components within normal limits  MAGNESIUM - Abnormal; Notable for the following components:   Magnesium 1.2 (*)    All other components within normal limits  URINALYSIS, ROUTINE W REFLEX MICROSCOPIC - Abnormal; Notable for the following components:   Color, Urine AMBER (*)    APPearance HAZY (*)    Protein, ur 30 (*)    Bacteria, UA FEW (*)    All other components  within normal limits  SALICYLATE LEVEL - Abnormal; Notable for the following components:   Salicylate Lvl <7.0 (*)    All other components within normal limits  ACETAMINOPHEN LEVEL - Abnormal; Notable for the following components:   Acetaminophen (Tylenol), Serum <10 (*)    All other components within normal limits  LACTIC ACID, PLASMA - Abnormal; Notable for the following components:   Lactic Acid, Venous >9.0 (*)    All other components within normal limits  LACTIC ACID, PLASMA - Abnormal; Notable for the following components:   Lactic Acid, Venous 4.0 (*)    All other components within normal limits  TSH - Abnormal; Notable for the following components:   TSH 4.847 (*)    All other components within normal limits  T4, FREE - Abnormal; Notable for the following components:   Free T4 1.71 (*)    All other components within normal limits  HEPATIC FUNCTION PANEL - Abnormal; Notable for the following components:   Total Protein 4.5 (*)    Albumin 1.7 (*)    All other components within normal limits  TROPONIN I (HIGH SENSITIVITY) - Abnormal; Notable for the following components:   Troponin I (High Sensitivity) 58 (*)    All other components within normal limits  RESP PANEL BY RT-PCR (RSV, FLU A&B, COVID)  RVPGX2  URINE DRUG SCREEN, QUALITATIVE ([facility] ONLY)  PHOSPHORUS  LIPASE, BLOOD     EKG My EKG interpretation with no evidence of acute ST elevation or depression.  Ventricular paced beats.   RADIOLOGY CT head and CT C-spine without acute pathology per my interpretation.   PROCEDURES:  Critical Care performed: Yes, see critical care procedure note(s)  Procedure Name: Intubation Date/Time: 09/15/2022 6:00 PM  Performed by: Janith Lima, MDPre-anesthesia Checklist: Patient identified, Patient being monitored, Emergency Drugs available, Timeout performed and Suction available Oxygen Delivery Method: Ambu bag Preoxygenation: Pre-oxygenation with 100% oxygen Induction Type:  Rapid sequence Ventilation: Mask ventilation without difficulty Laryngoscope Size: Glidescope and 4 Grade View: Grade I Tube size: 7.5 mm Number of attempts: 1 Airway Equipment and Method: Video-laryngoscopy Placement Confirmation: ETT inserted through vocal cords under direct vision, CO2 detector and Breath sounds checked- equal and bilateral Secured at: 23 cm Tube secured with: ETT holder Dental Injury: Teeth and Oropharynx as per pre-operative assessment  Difficulty Due To: Difficulty was unanticipated    .  Critical Care  Performed by: Janith Lima, MD Authorized by: Janith Lima, MD   Critical care provider statement:    Critical care time (minutes):  35   Critical care time was exclusive of:  Separately billable procedures and treating other patients   Critical care was necessary to treat or prevent imminent or life-threatening deterioration of the following conditions: seizure with encephalopathy and GCS 3.   Critical care was time spent personally by me on the following activities:  Development of treatment plan with patient or surrogate, discussions with consultants, evaluation of patient's response to treatment, examination of patient, ordering and review of laboratory studies, ordering and review of radiographic studies, ordering and performing treatments and interventions, pulse oximetry, re-evaluation of patient's condition and review of old charts    MEDICATIONS ORDERED IN ED: Medications  ketamine (KETALAR) 10 MG/ML injection (  Not Given 09/15/22 1437)  fentaNYL in NS (34mcg/ml) infusion-PREMIX (50 mcg/hr Intravenous Rate/Dose Change 09/15/22 1645)  midazolam (VERSED) 100 mg/100 mL (1 mg/mL) premix infusion (6 mg/hr Intravenous Rate/Dose Change 09/15/22 1647)  norepinephrine (LEVOPHED) 4mg  in (0.016 mg/mL) premix infusion (4 mcg/min Intravenous Rate/Dose Change 09/15/22 1759)  magnesium sulfate IVPB 2 g 50 mL (2 g Intravenous New Bag/Given 09/15/22 1727)   levETIRAcetam (KEPPRA) IVPB 1000 mg/100 mL premix (1,000 mg Intravenous New Bag/Given 09/15/22 1755)  levETIRAcetam (KEPPRA) 2,000 mg in sodium chloride 0.9 % 250 mL IVPB (has no administration in time range)  sodium chloride 0.9 % bolus 1,000 mL (0 mLs Intravenous Stopped 09/15/22 1500)  ketamine 50 mg in normal saline 5 mL (10 mg/mL) syringe (100 mg Intravenous Given 09/15/22 1354)  succinylcholine (ANECTINE) syringe 80 mg (80 mg Intravenous Given 09/15/22 1354)  potassium chloride 10 mEq in 100 mL IVPB (0 mEq Intravenous Stopped 09/15/22 1648)  sodium chloride 0.9 % bolus 1,000 mL (0 mLs Intravenous Stopped 09/15/22 1752)  ceFEPIme (MAXIPIME) 2 g in sodium chloride 0.9 % 100 mL IVPB (0 g Intravenous Stopped 09/15/22 1607)  metroNIDAZOLE (FLAGYL) IVPB 500 mg (0 mg Intravenous Stopped 09/15/22 1707)  vancomycin (VANCOCIN) IVPB 1000 mg/200 mL premix (0 mg Intravenous Stopped 09/15/22 1752)     IMPRESSION / MDM / ASSESSMENT AND PLAN / ED COURSE  I reviewed the triage vital signs and the nursing notes.                              Differential diagnosis includes, but is not limited to, seizure, electrolyte abnormality such as profound hyponatremia or hypocalcemia, intracranial bleed, intracranial mass, profound malnutrition, medication overdose.  Patient's presentation is most consistent with acute presentation with potential threat to life or bodily function.  Patient is a 67 year old male presenting today for acute encephalopathy and seizure activity on arrival.  Reportedly had 1 episode with EMS which improved with 2.5 mg Versed.  Gave patient additional 2.5 mg of Versed on arrival given tonic-clonic seizure and stiffness.  This resulted in relaxation of his extremities, however patient remained profoundly encephalopathic.  GCS was 3 for additional 10 to 15 minutes on arrival while collecting blood work.  Patient remained unresponsive necessitating intubation for airway protection and mental status at this  time.  Patient intubated successfully with ketamine and succinylcholine.  Taken to CT scanner for further evaluation.  CT head without contrast and CT C-spine showed no acute pathology.  Initial laboratory workup shows profound hypokalemia, hypocalcemia, bicarb at 9 with anion gap of 26  concerning for lactic acidosis secondary to seizure along with profound malnutrition.  No evidence of hyponatremia.  Patient given potassium repletion.  Also given 1 L of fluid on arrival.  After further chart review, see prior history of HFrEF with a EF of 25 to 35% and will hold off on additional fluids at this time.  Patient started on fentanyl and Versed after intubation for ongoing sedation.  No evidence of UTI.  Patient still pending additional laboratory workup at time of signout and was signed out to Dr. Scotty Court.  Once laboratory workup resulted, patient can be admitted to ICU for ongoing management of the vent as well as further seizure workup with neurology.  The patient is on the cardiac monitor to evaluate for evidence of arrhythmia and/or significant heart rate changes. Clinical Course as of 09/15/22 1808  Tue Sep 15, 2022  1423 Potassium(!!): 2.7 [DW]  1423 Calcium(!!): 5.1 [DW]  1423 Anion gap(!): 26 [DW]  1424 WBC(!): 12.2 [DW]  1432 CBC(!) Mild leukocytosis which could be from seizure activity.  Thrombocytopenia which is decreased from prior episodes [DW]  1515 Urinalysis, Routine w reflex microscopic -Urine, Clean Catch(!) No UTI [DW]    Clinical Course User Index [DW] Janith Lima, MD     FINAL CLINICAL IMPRESSION(S) / ED DIAGNOSES   Final diagnoses:  Seizure (HCC)  Acute encephalopathy  Hypokalemia  Hypocalcemia     Rx / DC Orders   ED Discharge Orders     None        Note:  This document was prepared using Dragon voice recognition software and may include unintentional dictation errors.   Janith Lima, MD 09/15/22 [id number]

## 2022-09-16 ENCOUNTER — Inpatient Hospital Stay (HOSPITAL_COMMUNITY): Payer: Medicare Other

## 2022-09-16 DIAGNOSIS — J9601 Acute respiratory failure with hypoxia: Secondary | ICD-10-CM | POA: Diagnosis not present

## 2022-09-16 DIAGNOSIS — G40901 Epilepsy, unspecified, not intractable, with status epilepticus: Secondary | ICD-10-CM | POA: Diagnosis not present

## 2022-09-16 DIAGNOSIS — F1020 Alcohol dependence, uncomplicated: Secondary | ICD-10-CM | POA: Diagnosis not present

## 2022-09-16 LAB — ECHOCARDIOGRAM COMPLETE
AV Peak grad: 2.5 mmHg
Ao pk vel: 0.78 m/s
Area-P 1/2: 3.28 cm2
Height: 70 in
Weight: 1957.68 [oz_av]

## 2022-09-16 LAB — CBC
HCT: 21 % — ABNORMAL LOW (ref 39.0–52.0)
Hemoglobin: 7.1 g/dL — ABNORMAL LOW (ref 13.0–17.0)
MCH: 33.3 pg (ref 26.0–34.0)
MCHC: 33.8 g/dL (ref 30.0–36.0)
MCV: 98.6 fL (ref 80.0–100.0)
Platelets: 78 10*3/uL — ABNORMAL LOW (ref 150–400)
RBC: 2.13 MIL/uL — ABNORMAL LOW (ref 4.22–5.81)
RDW: 16.4 % — ABNORMAL HIGH (ref 11.5–15.5)
WBC: 7.7 10*3/uL (ref 4.0–10.5)
nRBC: 0 % (ref 0.0–0.2)

## 2022-09-16 LAB — GLUCOSE, CAPILLARY
Glucose-Capillary: 105 mg/dL — ABNORMAL HIGH (ref 70–99)
Glucose-Capillary: 106 mg/dL — ABNORMAL HIGH (ref 70–99)
Glucose-Capillary: 107 mg/dL — ABNORMAL HIGH (ref 70–99)
Glucose-Capillary: 107 mg/dL — ABNORMAL HIGH (ref 70–99)
Glucose-Capillary: 109 mg/dL — ABNORMAL HIGH (ref 70–99)
Glucose-Capillary: 121 mg/dL — ABNORMAL HIGH (ref 70–99)

## 2022-09-16 LAB — BASIC METABOLIC PANEL
Anion gap: 16 — ABNORMAL HIGH (ref 5–15)
Anion gap: 10 (ref 5–15)
BUN: 13 mg/dL (ref 8–23)
BUN: 12 mg/dL (ref 8–23)
CO2: 13 mmol/L — ABNORMAL LOW (ref 22–32)
CO2: 14 mmol/L — ABNORMAL LOW (ref 22–32)
Calcium: 5.2 mg/dL — CL (ref 8.9–10.3)
Calcium: 6.9 mg/dL — ABNORMAL LOW (ref 8.9–10.3)
Chloride: 108 mmol/L (ref 98–111)
Chloride: 113 mmol/L — ABNORMAL HIGH (ref 98–111)
Creatinine, Ser: 1.45 mg/dL — ABNORMAL HIGH (ref 0.61–1.24)
Creatinine, Ser: 1.54 mg/dL — ABNORMAL HIGH (ref 0.61–1.24)
GFR, Estimated: 53 mL/min — ABNORMAL LOW (ref >60)
GFR, Estimated: 49 mL/min — ABNORMAL LOW (ref >60)
Glucose, Bld: 112 mg/dL — ABNORMAL HIGH (ref 70–99)
Glucose, Bld: 109 mg/dL — ABNORMAL HIGH (ref 70–99)
Potassium: 3.7 mmol/L (ref 3.5–5.1)
Potassium: 4.7 mmol/L (ref 3.5–5.1)
Sodium: 137 mmol/L (ref 135–145)
Sodium: 137 mmol/L (ref 135–145)

## 2022-09-16 LAB — HIV ANTIBODY (ROUTINE TESTING W REFLEX): HIV Screen 4th Generation wRfx: NONREACTIVE

## 2022-09-16 LAB — BLOOD GAS, ARTERIAL
Acid-base deficit: 11.9 mmol/L — ABNORMAL HIGH (ref 0.0–2.0)
Bicarbonate: 12.7 mmol/L — ABNORMAL LOW (ref 20.0–28.0)
O2 Saturation: 100 %
Patient temperature: 36
pCO2 arterial: 23 mmHg — ABNORMAL LOW (ref 32–48)
pH, Arterial: 7.34 — ABNORMAL LOW (ref 7.35–7.45)
pO2, Arterial: 199 mmHg — ABNORMAL HIGH (ref 83–108)

## 2022-09-16 LAB — PHOSPHORUS
Phosphorus: 3.3 mg/dL (ref 2.5–4.6)
Phosphorus: 2.2 mg/dL — ABNORMAL LOW (ref 2.5–4.6)

## 2022-09-16 LAB — MAGNESIUM
Magnesium: 2.5 mg/dL — ABNORMAL HIGH (ref 1.7–2.4)
Magnesium: 2.2 mg/dL (ref 1.7–2.4)

## 2022-09-16 MED ORDER — PERFLUTREN LIPID MICROSPHERE
1.0000 mL | INTRAVENOUS | Status: AC | PRN
  Administered 2022-09-16: 2 mL via INTRAVENOUS

## 2022-09-16 MED ORDER — VITAL 1.5 CAL PO LIQD
1000.0000 mL | ORAL | Status: DC
  Administered 2022-09-16 – 2022-09-22 (×5): 1000 mL
  Filled 2022-09-16 (×7): qty 1000

## 2022-09-16 MED ORDER — CALCIUM GLUCONATE-NACL 2-0.675 GM/100ML-% IV SOLN
2.0000 g | Freq: Once | INTRAVENOUS | Status: DC
  Filled 2022-09-16: qty 100

## 2022-09-16 MED ORDER — ADULT MULTIVITAMIN W/MINERALS CH
1.0000 | ORAL_TABLET | Freq: Every day | ORAL | Status: DC
  Administered 2022-09-16 – 2022-09-24 (×9): 1
  Filled 2022-09-16 (×9): qty 1

## 2022-09-16 MED ORDER — SODIUM CHLORIDE 0.9 % IV SOLN
4.0000 g | Freq: Once | INTRAVENOUS | Status: AC
  Administered 2022-09-16: 4 g via INTRAVENOUS
  Filled 2022-09-16: qty 40

## 2022-09-16 MED ORDER — THIAMINE HCL 100 MG/ML IJ SOLN
250.0000 mg | INTRAVENOUS | Status: AC
  Administered 2022-09-18 – 2022-09-20 (×3): 250 mg via INTRAVENOUS
  Filled 2022-09-16 (×3): qty 2.5

## 2022-09-16 MED ORDER — SODIUM CHLORIDE 0.9 % IV SOLN
750.0000 mg | Freq: Two times a day (BID) | INTRAVENOUS | Status: DC
  Administered 2022-09-16 – 2022-09-17 (×2): 750 mg via INTRAVENOUS
  Filled 2022-09-16 (×4): qty 7.5

## 2022-09-16 MED ORDER — THIAMINE HCL 100 MG/ML IJ SOLN
500.0000 mg | Freq: Three times a day (TID) | INTRAVENOUS | Status: AC
  Administered 2022-09-16 – 2022-09-17 (×6): 500 mg via INTRAVENOUS
  Filled 2022-09-16 (×6): qty 5

## 2022-09-16 MED ORDER — THIAMINE HCL 100 MG/ML IJ SOLN
100.0000 mg | INTRAMUSCULAR | Status: DC
  Administered 2022-09-21: 100 mg via INTRAVENOUS
  Filled 2022-09-16: qty 2

## 2022-09-16 MED ORDER — PROSOURCE TF20 ENFIT COMPATIBL EN LIQD
60.0000 mL | Freq: Every day | ENTERAL | Status: DC
  Administered 2022-09-16 – 2022-09-24 (×9): 60 mL
  Filled 2022-09-16 (×9): qty 60

## 2022-09-16 MED ORDER — GERHARDT'S BUTT CREAM
TOPICAL_CREAM | Freq: Three times a day (TID) | CUTANEOUS | Status: DC
  Administered 2022-09-16 – 2022-09-17 (×4): 1 via TOPICAL
  Filled 2022-09-16 (×4): qty 1

## 2022-09-16 MED ORDER — FOLIC ACID 1 MG PO TABS
1.0000 mg | ORAL_TABLET | Freq: Every day | ORAL | Status: DC
  Administered 2022-09-17 – 2022-09-24 (×8): 1 mg
  Filled 2022-09-16 (×8): qty 1

## 2022-09-16 NOTE — Progress Notes (Signed)
Subjective: Wife at bedside denies any seizures overnight. States he has chronic diarrhea for about 2 years.   ROS: Unable to obtain due to poor mental status  Examination  Vital signs in last 24 hours: Temp:  [94.5 F (34.7 C)-99.5 F (37.5 C)] 98.4 F (36.9 C) (09/04 1915) Pulse Rate:  [60-85] 71 (09/04 1934) Resp:  [4-19] 16 (09/04 1934) BP: (72-128)/(46-76) 120/67 (09/04 1915) SpO2:  [94 %-100 %] 98 % (09/04 1934) FiO2 (%):  [30 %] 30 % (09/04 1934) Weight:  [55.5 kg] 55.5 kg (09/04 0430)  General: lying in bed, intubated Neuro: on versed @2ml /hr, comatose, doesn't open eyes to noxious stimuli, PERLA, no forced gaze deviation, subtle withdrawal to noxious stimulation in LUE and RUE, no withdrawal in BL LE  Basic Metabolic Panel: Recent Labs  Lab 09/15/22 1338 09/15/22 1555 09/15/22 2129 09/16/22 0538 09/16/22 1156 09/16/22 1744  NA 140  --  136 137 137  --   K 2.7*  --  2.0* 3.7 4.7  --   CL 105  --  106 108 113*  --   CO2 9*  --  15* 13* 14*  --   GLUCOSE 107*  --  159* 112* 109*  --   BUN 19  --  16 13 12   --   CREATININE 2.16*  --  1.81* 1.45* 1.54*  --   CALCIUM 5.1*  --  4.6* 5.2* 6.9*  --   MG 1.2*  --   --  2.5*  --  2.2  PHOS  --  3.7  --  3.3  --  2.2*    CBC: Recent Labs  Lab 09/15/22 1338 09/16/22 0538  WBC 12.2* 7.7  HGB 10.8* 7.1*  HCT 33.2* 21.0*  MCV 102.8* 98.6  PLT 90* 78*     Coagulation Studies: No results for input(s): "LABPROT", "INR" in the last 72 hours.  Imaging [facility] reviewed 09/15/2022: No acute intracranial abnormality.   ASSESSMENT AND PLAN:  67 year old male with a history of heavy EtOH use, presenting to the ED after recurrent spells of tonic flexor posturing of BUE with unresponsiveness. The posturing spells were preceded by a 3 day history of malaise, decreased PO intake and diarrhea. Last drink of Bourbon, per wife, was estimated to have been on Friday. He drinks about one 1.5 L bottle of Bourbon every 4-5 days and is a  long-time chronic drinker who "nurses" his drinks from approximately the afternoon to late evening/night every day. He has no history of seizures.   Seizure like activity Alcohol use disorder Chronic diarrhea Anemia Thrombocytopenia - No seizures overnight  Recommendations - Stop versed - Hold off ASMs ( anti seizure meds) until we see ictal-interictal abnormality on eeg as seizure could have been provoked due to alcohol withdrawal - Continue eeg overnight to monitor for sz recurrence - MRI Brain wo contrast if pacemaker if MRI compatible - Defer management of comorbidities to ICU team - Discussed plan with ICU team via secure chat and wife at bedside  CRITICAL CARE Performed by: Charlsie Quest   Total critical care time: 38 minutes  Critical care time was exclusive of separately billable procedures and treating other patients.  Critical care was necessary to treat or prevent imminent or life-threatening deterioration.  Critical care was time spent personally by me on the following activities: development of treatment plan with patient and/or surrogate as well as nursing, discussions with consultants, evaluation of patient's response to treatment, examination of patient,  obtaining history from patient or surrogate, ordering and performing treatments and interventions, ordering and review of laboratory studies, ordering and review of radiographic studies, pulse oximetry and re-evaluation of patient's condition.     Lindie Spruce Epilepsy [facility] For questions after 5pm please refer to AMION to reach the Neurologist on call

## 2022-09-16 NOTE — Progress Notes (Signed)
Foley not functioning properly, unable to flush. Foley was removed and condom cath placed Continuing to monitor for urinary retention.

## 2022-09-16 NOTE — Procedures (Signed)
Patient Name: Derek Oconnell  MRN: [id number]  Epilepsy Attending: Charlsie Quest  Referring Physician/Provider: Rejeana Brock, MD  Duration: 09/15/2022 2306 to 09/16/2022 2306  Patient history: 67 year old male with a history of heavy EtOH use, presenting to the ED after recurrent spells of tonic flexor posturing of BUE with unresponsiveness. The posturing spells were preceded by a 3 day history of malaise, decreased PO intake and diarrhea. Last drink of Bourbon, per wife, was estimated to have been on Friday. EEG to evaluate for seizure  Level of alertness: comatose  AEDs during EEG study: LEV, Versed  Technical aspects: This EEG study was done with scalp electrodes positioned according to the 10-20 International system of electrode placement. Electrical activity was reviewed with band pass filter of 1-70Hz , sensitivity of 7 uV/mm, display speed of 71mm/sec with a 60Hz  notched filter applied as appropriate. EEG data were recorded continuously and digitally stored.  Video monitoring was available and reviewed as appropriate.  Description: EEG showed continuous generalized 3 to 6 Hz theta-delta slowing admixed with 15 to 18 Hz beta activity distributed symmetrically and diffusely. Hyperventilation and photic stimulation were not performed.     ABNORMALITY - Continuous slow, generalized  IMPRESSION: This study is suggestive of severe diffuse encephalopathy likely related to sedation. No seizures or epileptiform discharges were seen throughout the recording.  Kaydi Kley Annabelle Harman

## 2022-09-16 NOTE — Progress Notes (Signed)
Patient voided 105 cc throughout my shift. MD notified. No new orders received at this time.

## 2022-09-16 NOTE — Progress Notes (Signed)
Initial Nutrition Assessment  DOCUMENTATION CODES:   Underweight, Severe malnutrition in context of social or environmental circumstances  INTERVENTION:   Initiate tube feeding via Cortrak tube: Vital 1.5 at 20 ml/h and increase by 10 ml every 8 hours to goal rate of 50 ml/hr (1200 ml per day) Prosource TF20 60 ml daily  Provides 1880 kcal, 101 gm protein, 912 ml free water daily  Continue MVI with minerals, folic acid, and thiamine  Once tolerating goal will add Juven  Monitor magnesium and phosphorus every 12 hours x 4 occurrences, MD to replete as needed, as pt is at risk for refeeding syndrome given pt meets criteria for severe malnutrition.  Check Vitamin A, Zinc, Vitamin B6 levels    NUTRITION DIAGNOSIS:   Severe Malnutrition related to social / environmental circumstances as evidenced by severe fat depletion, severe muscle depletion.  GOAL:   Patient will meet greater than or equal to 90% of their needs  MONITOR:   TF tolerance, Labs  REASON FOR ASSESSMENT:   Consult Enteral/tube feeding initiation and management  ASSESSMENT:   Pt with PMH of alcohol abuse, CHF with ICH, and COPD admitted from home after witnessed seizure. Admitted to [facility] tx to [facility] for LTM. Pt usually receives care at [facility].   Spoke with wife who is at bedside and provides helpful information about pt's hx. She reports pt goes through cycles where he stops eating (possibly due to GI issues) eventually worsens and then gets hospitalized. He gets boosted back up but then deteriorates at home months later. Per his last AVS shown by wife he should be taking KCl, magnesium, thiamine, and folic acid. She reports that she has noticed swallowing difficulty with foods and with some medicines. Reports not taking vitamins x 2 weeks PTA. Pt wife pt does not eat Breakfast or Lunch. Will sometimes drink an ensure. He drinks 2 large cups filled with bourbon and coke. 2/day usually starting in the afternoon. Per RN  1.5 L every 4 days.     9/3 - intubated; tx to [facility] for LTM 9/4 - s/p cortrak placement; tip proximal stomach   Medications reviewed and include: folic acid, MVI with minerals, protonix, 500 mg thiamine every 8 hours x 48 hours Fentanyl  LR @ 125 ml/hr Keppra Versed 4 g mag sulfate x 1 Levophed @ 6 mcg   Labs reviewed:  K: 2.7 -> 2.0 -> 3.7 -> 4.7 PO4 3.7 -> 3.3 Mag 1.2 -> 2.5 CBG's: 106-121  NUTRITION - FOCUSED PHYSICAL EXAM:  Flowsheet Row Most Recent Value  Orbital Region Severe depletion  Upper Arm Region Severe depletion  Thoracic and Lumbar Region Severe depletion  Buccal Region Unable to assess  Temple Region Severe depletion  Clavicle Bone Region Severe depletion  Clavicle and Acromion Bone Region Severe depletion  Scapular Bone Region Severe depletion  Dorsal Hand Severe depletion  Patellar Region Severe depletion  Anterior Thigh Region Severe depletion  Posterior Calf Region Severe depletion  Edema (RD Assessment) None  Hair Reviewed  Eyes Unable to assess  Mouth Unable to assess  Skin Reviewed  Nails Reviewed       Diet Order:   Diet Order             Diet NPO time specified  Diet effective now                   EDUCATION NEEDS:   Not appropriate for education at this time  Skin:  Skin Assessment: Skin Integrity  Issues: Skin Integrity Issues:: Stage I, DTI DTI: sacrum Stage I: Vertebral column (mid back)  Last BM:  9/4 x 2 small; type 7  Height:   Ht Readings from Last 1 Encounters:  09/15/22 5\' 10"  (1.778 m)    Weight:   Wt Readings from Last 1 Encounters:  09/16/22 55.5 kg    BMI:  Body mass index is 17.56 kg/m.  Estimated Nutritional Needs:   Kcal:  1800-2000  Protein:  90-115 grams  Fluid:  >1.8 L/day  Cammy Copa., RD, LDN, CNSC See AMiON for contact information

## 2022-09-16 NOTE — Procedures (Signed)
 Cortrak  Person Inserting Tube:  Mahala Menghini, RD Tube Type:  Cortrak - 43 inches Tube Size:  10 Tube Location:  Left nare Secured by: Bridle Technique Used to Measure Tube Placement:  Marking at nare/corner of mouth Cortrak Secured At:  65 cm   Cortrak Tube Team Note:  Consult received to place a Cortrak feeding tube.   X-ray is required, abdominal x-ray has been ordered by the Cortrak team. Please confirm tube placement before using the Cortrak tube.   If the tube becomes dislodged please keep the tube and contact the Cortrak team at [email or web address] for replacement.  If after hours and replacement cannot be delayed, place a NG tube and confirm placement with an abdominal x-ray.    Mertie Clause, MS, RD, LDN Registered Dietitian II Please see AMiON for contact information.

## 2022-09-16 NOTE — Consult Note (Signed)
WOC Nurse Consult Note: Reason for Consult: sacrum  Wound type: 1. Moisture Associated Skin Damage sacrum  ICD-10 CM Codes for Irritant Dermatitis L24A2 - Due to fecal, urinary or dual incontinence 2.  Deep Tissue Pressure Injury thoracic spine  Pressure Injury POA: Yes Measurement: 1.  Widespread erythema and partial thickness skin loss to sacrum/coccyx and buttocks r/t moisture  2.  DTPI thoracic spine 5 cm x 5 cm dark maroon discoloration, skin intact   Drainage (amount, consistency, odor) minimal serosanguinous buttocks/sacrum  Periwound: erythematous  Dressing procedure/placement/frequency: Clean buttocks/sacrum/coccyx with soap and water, dry and apply Gerhardt's Butt Cream to entire area 3 times a day and prn soiling.  Clean thoracic spine with soap and water, dry and apply a layer of Xerforom gauze [place] [phone]) to purple maroon discoloration daily.  Cover with silicone foam.  May lift foam daily to replace Xeroform.  Change foam q3 days and prn soiling.   POC discussed with bedside nurse. WOC team will not follow at this time. Re-consult if further needs arise.   Thank you,    Priscella Mann MSN, RN-BC, [employer] [phone]

## 2022-09-16 NOTE — Progress Notes (Signed)
Spouse at bedside. Provided update.  Spouse states patient has not eaten since Saturday. She states "he does this often." Patient may have been in a confused state day prior to seizures. Confirmed alcohol dependence.

## 2022-09-16 NOTE — Progress Notes (Signed)
NAME:  Derek Oconnell, MRN:  [id number], DOB:  1955/12/14, LOS: 1 ADMISSION DATE:  09/15/2022, CONSULTATION DATE:  09/15/22 REFERRING MD:  Terrilee Files , CHIEF COMPLAINT:  Siezures   History of Present Illness:  67 y.o male presented to [facility] 9/3 with AMS and seizure like activity.  In ED, he had persistent AMS along with additional seizure like activity. He was ultimately intubated and sedated. He was later transferred to [facility] for further evaluation and management including LTM.  No seizure activity overnight, and remains on EEG. He is still intubated and sedated, but plan to wean sedation and extubate if able.   Pertinent  Medical History  HTN, CHF, CAD s/p stenting and PPM, COPD, EtOH dependence  Significant Hospital Events: Including procedures, antibiotic start and stop dates in addition to other pertinent events   9/3 admitted and intubated at outside hospital  Interim History / Subjective:  Patient remains sedated on ventilator.   Objective   Blood pressure (!) 84/59, pulse 71, temperature (!) 97.5 F (36.4 C), resp. rate 16, height 5\' 10"  (1.778 m), weight 55.5 kg, SpO2 99%.    Vent Mode: PRVC FiO2 (%):  [30 %] 30 % Set Rate:  [16 bmp-18 bmp] 18 bmp Vt Set:  [420 mL] 420 mL PEEP:  [5 cmH20] 5 cmH20 Plateau Pressure:  [14 cmH20-15 cmH20] 15 cmH20   Intake/Output Summary (Last 24 hours) at 09/16/2022 0734 Last data filed at 09/16/2022 0700 Gross per 24 hour  Intake 3180.12 ml  Output 285 ml  Net 2895.12 ml   Filed Weights   09/16/22 0430  Weight: 55.5 kg    Examination: General: Chronically ill-appearing, sedated Lungs: CTAB. On ventilator. Cardiovascular: RRR, no murmurs Extremities: No swelling  Resolved Hospital Problem list     Assessment & Plan:  Seizures  No seizure activity overnight. Isoelectric EEG for most of monitoring. Could be 2/2 metabolic derangements vs EtOH withdrawal. CT head negative. - Neurology following, appreciate the assistance - LTM per neuro -  Continue Keppra - Decrease Versed gtt by 1 ml/hr until off - MRI brain when able - Will need to DC Wellbutrin at discharge   Acute hypoxic respiratory failure Will attempt to wake patient and wean from ventilator  - PRVC, VAP bundle  Hypotension -Continue peripheral norepinephrine -Fluid resuscitation - Hold home metoprolol  Hypocalcemia Ca 5.2 depsite 3g of calcium gluconate.  -Replete 4 g calcium glunate  AKI   Improving with fluid resuscitation. AGMA - 2/2 renal failure + lactate in setting seizures.  Hx EtOH dependence -Start Thiamine and multivitamin -CIWA protocol once patient is extubated  Best Practice (right click and "Reselect all SmartList Selections" daily)   Diet/type: NPO w/ meds via tube DVT prophylaxis: prophylactic heparin  GI prophylaxis: PPI Lines: N/A Foley:  Yes, and it is still needed Code Status:  full code Last date of multidisciplinary goals of care discussion [Pending]  Labs   CBC: Recent Labs  Lab 09/15/22 1338 09/16/22 0538  WBC 12.2* 7.7  HGB 10.8* 7.1*  HCT 33.2* 21.0*  MCV 102.8* 98.6  PLT 90* 78*    Basic Metabolic Panel: Recent Labs  Lab 09/15/22 1338 09/15/22 1555 09/15/22 2129 09/16/22 0538  NA 140  --  136 137  K 2.7*  --  2.0* 3.7  CL 105  --  106 108  CO2 9*  --  15* 13*  GLUCOSE 107*  --  159* 112*  BUN 19  --  16 13  CREATININE 2.16*  --  1.81* 1.45*  CALCIUM 5.1*  --  4.6* 5.2*  MG 1.2*  --   --  2.5*  PHOS  --  3.7  --  3.3   GFR: Estimated Creatinine Clearance: 39.3 mL/min (A) (by C-G formula based on SCr of 1.45 mg/dL (H)). Recent Labs  Lab 09/15/22 1338 09/15/22 1555 09/15/22 2129 09/16/22 0538  WBC 12.2*  --   --  7.7  LATICACIDVEN >9.0* 4.0* 1.0  --     Liver Function Tests: Recent Labs  Lab 09/15/22 1555  AST 23  ALT 8  ALKPHOS 89  BILITOT 0.7  PROT 4.5*  ALBUMIN 1.7*   Recent Labs  Lab 09/15/22 1555  LIPASE 38   No results for input(s): "AMMONIA" in the last 168  hours.  ABG    Component Value Date/Time   PHART 7.34 (L) 09/16/2022 0522   PCO2ART 23 (L) 09/16/2022 0522   PO2ART 199 (H) 09/16/2022 0522   HCO3 12.7 (L) 09/16/2022 0522   ACIDBASEDEF 11.9 (H) 09/16/2022 0522   O2SAT 100 09/16/2022 0522     Coagulation Profile: No results for input(s): "INR", "PROTIME" in the last 168 hours.  Cardiac Enzymes: No results for input(s): "CKTOTAL", "CKMB", "CKMBINDEX", "TROPONINI" in the last 168 hours.  HbA1C: No results found for: "HGBA1C"  CBG: Recent Labs  Lab 09/15/22 2011 09/15/22 2352 09/16/22 0345 09/16/22 0729  GLUCAP 141* 166* 121* 107*    Past Medical History:  He,  has a past medical history of Myocardial infarction (HCC) (1989).   Surgical History:   Past Surgical History:  Procedure Laterality Date   FEMORAL-POPLITEAL BYPASS GRAFT Bilateral 2021   GALLBLADDER SURGERY  2019   VASECTOMY  1992     Social History:   reports that he has been smoking cigarettes. He uses smokeless tobacco. He reports current alcohol use.   Family History:  His family history is not on file.   Allergies No Known Allergies   Home Medications  Prior to Admission medications   Medication Sig Start Date End Date Taking? Authorizing Provider  buPROPion (WELLBUTRIN XL) 150 MG 24 hr tablet Take 150 mg by mouth daily.    [provider]  clopidogrel (PLAVIX) 75 MG tablet Take 75 mg by mouth daily.    [provider]  metoprolol succinate (TOPROL-XL) 50 MG 24 hr tablet Take 50 mg by mouth daily. Take with or immediately following a meal.    [provider]     Critical care time: 35 min

## 2022-09-17 DIAGNOSIS — G40901 Epilepsy, unspecified, not intractable, with status epilepticus: Secondary | ICD-10-CM | POA: Diagnosis not present

## 2022-09-17 DIAGNOSIS — F1020 Alcohol dependence, uncomplicated: Secondary | ICD-10-CM | POA: Diagnosis not present

## 2022-09-17 DIAGNOSIS — J9601 Acute respiratory failure with hypoxia: Secondary | ICD-10-CM | POA: Diagnosis not present

## 2022-09-17 DIAGNOSIS — E43 Unspecified severe protein-calorie malnutrition: Secondary | ICD-10-CM | POA: Insufficient documentation

## 2022-09-17 LAB — CBC
HCT: 24.9 % — ABNORMAL LOW (ref 39.0–52.0)
Hemoglobin: 8.1 g/dL — ABNORMAL LOW (ref 13.0–17.0)
MCH: 33.3 pg (ref 26.0–34.0)
MCHC: 32.5 g/dL (ref 30.0–36.0)
MCV: 102.5 fL — ABNORMAL HIGH (ref 80.0–100.0)
Platelets: 95 10*3/uL — ABNORMAL LOW (ref 150–400)
RBC: 2.43 MIL/uL — ABNORMAL LOW (ref 4.22–5.81)
RDW: 17.4 % — ABNORMAL HIGH (ref 11.5–15.5)
WBC: 5.8 10*3/uL (ref 4.0–10.5)
nRBC: 0 % (ref 0.0–0.2)

## 2022-09-17 LAB — BASIC METABOLIC PANEL
Anion gap: 7 (ref 5–15)
BUN: 12 mg/dL (ref 8–23)
CO2: 17 mmol/L — ABNORMAL LOW (ref 22–32)
Calcium: 6.9 mg/dL — ABNORMAL LOW (ref 8.9–10.3)
Chloride: 114 mmol/L — ABNORMAL HIGH (ref 98–111)
Creatinine, Ser: 1.42 mg/dL — ABNORMAL HIGH (ref 0.61–1.24)
GFR, Estimated: 54 mL/min — ABNORMAL LOW (ref >60)
Glucose, Bld: 116 mg/dL — ABNORMAL HIGH (ref 70–99)
Potassium: 4.2 mmol/L (ref 3.5–5.1)
Sodium: 138 mmol/L (ref 135–145)

## 2022-09-17 LAB — GLUCOSE, CAPILLARY
Glucose-Capillary: 114 mg/dL — ABNORMAL HIGH (ref 70–99)
Glucose-Capillary: 135 mg/dL — ABNORMAL HIGH (ref 70–99)
Glucose-Capillary: 146 mg/dL — ABNORMAL HIGH (ref 70–99)
Glucose-Capillary: 120 mg/dL — ABNORMAL HIGH (ref 70–99)
Glucose-Capillary: 135 mg/dL — ABNORMAL HIGH (ref 70–99)
Glucose-Capillary: 135 mg/dL — ABNORMAL HIGH (ref 70–99)

## 2022-09-17 LAB — MAGNESIUM
Magnesium: 1.9 mg/dL (ref 1.7–2.4)
Magnesium: 2.2 mg/dL (ref 1.7–2.4)

## 2022-09-17 LAB — PHOSPHORUS
Phosphorus: 2.3 mg/dL — ABNORMAL LOW (ref 2.5–4.6)
Phosphorus: 2.9 mg/dL (ref 2.5–4.6)

## 2022-09-17 MED ORDER — ORAL CARE MOUTH RINSE
15.0000 mL | OROMUCOSAL | Status: DC
  Administered 2022-09-17 – 2022-09-24 (×28): 15 mL via OROMUCOSAL

## 2022-09-17 MED ORDER — LORAZEPAM 2 MG/ML IJ SOLN
1.0000 mg | INTRAMUSCULAR | Status: AC | PRN
  Administered 2022-09-17 – 2022-09-20 (×4): 1 mg via INTRAVENOUS
  Filled 2022-09-17 (×4): qty 1

## 2022-09-17 MED ORDER — ORAL CARE MOUTH RINSE: 15.0000 mL | OROMUCOSAL | Status: DC | PRN

## 2022-09-17 MED ORDER — SODIUM PHOSPHATES 45 MMOLE/15ML IV SOLN
25.0000 mmol | Freq: Once | INTRAVENOUS | Status: AC
  Administered 2022-09-17: 25 mmol via INTRAVENOUS
  Filled 2022-09-17: qty 8.33

## 2022-09-17 MED ORDER — MAGNESIUM SULFATE 2 GM/50ML IV SOLN
2.0000 g | Freq: Once | INTRAVENOUS | Status: AC
  Administered 2022-09-17: 2 g via INTRAVENOUS
  Filled 2022-09-17: qty 50

## 2022-09-17 MED ORDER — LORAZEPAM 1 MG PO TABS: 1.0000 mg | ORAL_TABLET | ORAL | Status: AC | PRN

## 2022-09-17 MED ORDER — CALCIUM GLUCONATE-NACL 2-0.675 GM/100ML-% IV SOLN
2.0000 g | Freq: Once | INTRAVENOUS | Status: AC
  Administered 2022-09-17: 2000 mg via INTRAVENOUS
  Filled 2022-09-17: qty 100

## 2022-09-17 NOTE — TOC CM/SW Note (Signed)
Transition of Care [facility]) - Inpatient Brief Assessment   Patient Details  Name: Derek Oconnell MRN: [id number] Date of Birth: 08/29/1955  Transition of Care [facility]) CM/SW Contact:    Tom-Johnson, Hershal Coria, RN Phone Number: 09/17/2022, 10:43 AM   Clinical Narrative:  Patient presented to the ED after being unresponsive at home and with Seizure-like activity when EMS arrives,  was given 2.5 mg of Versed.  On IV abx. Has a Pacemaker placed this past April and a Stent placed 48yrs go.   Patient currently intubated and sedated. Neurology following.   No TOC needs or recommendations noted at this time.  Patient not Medically ready for discharge.  CM will continue to follow as patient progresses with care towards discharge.       Transition of Care Asessment: Insurance and Status: Insurance coverage has been reviewed Patient has primary care physician: Yes Home environment has been reviewed: Yes Prior level of function:: Independent Prior/Current Home Services: No current home services Social Determinants of Health Reivew: SDOH reviewed no interventions necessary Readmission risk has been reviewed: Yes Transition of care needs: transition of care needs identified, TOC will continue to follow (SA Education)

## 2022-09-17 NOTE — Procedures (Signed)
Patient Name: Jackie Khawaja  MRN: [id number]  Epilepsy Attending: Charlsie Quest  Referring Physician/Provider: Rejeana Brock, MD  Duration: 09/16/2022 2306 to 09/17/2022 0900   Patient history: 67 year old male with a history of heavy EtOH use, presenting to the ED after recurrent spells of tonic flexor posturing of BUE with unresponsiveness. The posturing spells were preceded by a 3 day history of malaise, decreased PO intake and diarrhea. Last drink of Bourbon, per wife, was estimated to have been on Friday. EEG to evaluate for seizure   Level of alertness: comatose   AEDs during EEG study: LEV   Technical aspects: This EEG study was done with scalp electrodes positioned according to the 10-20 International system of electrode placement. Electrical activity was reviewed with band pass filter of 1-70Hz , sensitivity of 7 uV/mm, display speed of 44mm/sec with a 60Hz  notched filter applied as appropriate. EEG data were recorded continuously and digitally stored.  Video monitoring was available and reviewed as appropriate.   Description: EEG showed continuous generalized rhythmic 2-3Hz  delta slowing. Hyperventilation and photic stimulation were not performed.      ABNORMALITY - Continuous slow, generalized   IMPRESSION: This study is suggestive of severe diffuse encephalopathy likely related to sedation. No seizures or epileptiform discharges were seen throughout the recording.   Amador Braddy Annabelle Harman

## 2022-09-17 NOTE — Progress Notes (Signed)
NAME:  Derek Oconnell, MRN:  [id number], DOB:  March 07, 1955, LOS: 2 ADMISSION DATE:  09/15/2022, CONSULTATION DATE:  9/3 REFERRING MD:  [facility], CHIEF COMPLAINT:  Siezures   History of Present Illness:  67 year old male presented to [facility] 9/3 with AMS and seizure-like activity. In ED, he had persistent AMS along with additional seizure-like activity.  He was ultimately intubated and sedated.  He was later transferred to [facility] for further evaluation management including LTM.  He has remained on EEG, without seizure activity 2/2 sedation.  Patient is awake and following commands, remains intubated-with plans to extubate if he passes spontaneous breathing trial.  Pertinent  Medical History  HTN, CHF, CAD s/p stenting and PPM, COPD, EtOH dependence   Significant Hospital Events: Including procedures, antibiotic start and stop dates in addition to other pertinent events   9/3 admitted and intubated at outside hospital   Interim History / Subjective:  Patient is awake and responds to questions, following commands.  Denies pain.  Objective   Blood pressure 100/73, pulse 88, temperature 98.2 F (36.8 C), temperature source Oral, resp. rate 19, height 5\' 10"  (1.778 m), weight 55.5 kg, SpO2 99%.    Vent Mode: PRVC FiO2 (%):  [3 %-30 %] 30 % Set Rate:  [16 bmp-18 bmp] 16 bmp Vt Set:  [420 mL] 420 mL PEEP:  [5 cmH20] 5 cmH20 Plateau Pressure:  [14 cmH20-16 cmH20] 14 cmH20   Intake/Output Summary (Last 24 hours) at 09/17/2022 1015 Last data filed at 09/17/2022 1000 Gross per 24 hour  Intake 3638.48 ml  Output 110 ml  Net 3528.48 ml   Filed Weights   09/16/22 0430  Weight: 55.5 kg    Examination: General: NAD, chronically ill-appearing, cachectic Lungs: CTAB, currently on pressure support ventilator Cardiovascular: RRR, no murmurs Extremities: No edema Neuro: Awake, follows commands  Resolved Hospital Problem list   AKI  Assessment & Plan:  Seizure-like activity No seizure activity since  admission. EEG showing severe diffuse encephalopathy likely 2/2 sedation, which has now been weaned. Holding ASMs per neurology recs. Pacemaker is RESONATET HF G547, which should be MRI compatible. -Neurology consulted, appreciate recs -MRI brain once patient is extubated   Acute hypoxic respiratory failure Sedation weaned, awake and responds to commands.  -Spontaneous breathing trial, plan to extubate  Hypotension -Peripheral norepinephrine to maintain map >65 -Hold home metop  Protein malnutrition Hypocalcemia Hypomagnesia -RD consulted, appreciate recs -Continue Cortrak -Replete electrolytes -Refeeding labs  Hx EtOH dependence -Thiamine and multivitamin -CIWA protocol once patient is extubated  Best Practice (right click and "Reselect all SmartList Selections" daily)   Diet/type: tubefeeds DVT prophylaxis: prophylactic heparin  GI prophylaxis: PPI Lines: N/A Foley:  N/A Code Status:  full code Last date of multidisciplinary goals of care discussion [Pending]  Labs   CBC: Recent Labs  Lab 09/15/22 1338 09/16/22 0538 09/17/22 0558  WBC 12.2* 7.7 5.8  HGB 10.8* 7.1* 8.1*  HCT 33.2* 21.0* 24.9*  MCV 102.8* 98.6 102.5*  PLT 90* 78* 95*    Basic Metabolic Panel: Recent Labs  Lab 09/15/22 1338 09/15/22 1555 09/15/22 2129 09/16/22 0538 09/16/22 1156 09/16/22 1744 09/17/22 0558  NA 140  --  136 137 137  --  138  K 2.7*  --  2.0* 3.7 4.7  --  4.2  CL 105  --  106 108 113*  --  114*  CO2 9*  --  15* 13* 14*  --  17*  GLUCOSE 107*  --  159* 112* 109*  --  116*  BUN 19  --  16 13 12   --  12  CREATININE 2.16*  --  1.81* 1.45* 1.54*  --  1.42*  CALCIUM 5.1*  --  4.6* 5.2* 6.9*  --  6.9*  MG 1.2*  --   --  2.5*  --  2.2 1.9  PHOS  --  3.7  --  3.3  --  2.2* 2.3*   GFR: Estimated Creatinine Clearance: 40.2 mL/min (A) (by C-G formula based on SCr of 1.42 mg/dL (H)). Recent Labs  Lab 09/15/22 1338 09/15/22 1555 09/15/22 2129 09/16/22 0538 09/17/22 0558   WBC 12.2*  --   --  7.7 5.8  LATICACIDVEN >9.0* 4.0* 1.0  --   --     Liver Function Tests: Recent Labs  Lab 09/15/22 1555  AST 23  ALT 8  ALKPHOS 89  BILITOT 0.7  PROT 4.5*  ALBUMIN 1.7*   Recent Labs  Lab 09/15/22 1555  LIPASE 38   No results for input(s): "AMMONIA" in the last 168 hours.  ABG    Component Value Date/Time   PHART 7.34 (L) 09/16/2022 0522   PCO2ART 23 (L) 09/16/2022 0522   PO2ART 199 (H) 09/16/2022 0522   HCO3 12.7 (L) 09/16/2022 0522   ACIDBASEDEF 11.9 (H) 09/16/2022 0522   O2SAT 100 09/16/2022 0522     Coagulation Profile: No results for input(s): "INR", "PROTIME" in the last 168 hours.  Cardiac Enzymes: No results for input(s): "CKTOTAL", "CKMB", "CKMBINDEX", "TROPONINI" in the last 168 hours.  HbA1C: No results found for: "HGBA1C"  CBG: Recent Labs  Lab 09/16/22 1521 09/16/22 1946 09/16/22 2339 09/17/22 0352 09/17/22 0721  GLUCAP 107* 109* 105* 114* 135*   Past Medical History:  He,  has a past medical history of Myocardial infarction (HCC) (1989).   Surgical History:   Past Surgical History:  Procedure Laterality Date   FEMORAL-POPLITEAL BYPASS GRAFT Bilateral 2021   GALLBLADDER SURGERY  2019   VASECTOMY  1992     Social History:   reports that he has been smoking cigarettes. He uses smokeless tobacco. He reports current alcohol use.   Family History:  His family history is not on file.   Allergies No Known Allergies   Home Medications  Prior to Admission medications   Medication Sig Start Date End Date Taking? Authorizing Provider  aspirin EC 81 MG tablet Take 81 mg by mouth daily. Swallow whole.   Yes [provider]  atorvastatin (LIPITOR) 80 MG tablet Take 80 mg by mouth daily.   Yes [provider]  buPROPion (WELLBUTRIN XL) 150 MG 24 hr tablet Take 150 mg by mouth daily.   Yes [provider]  clopidogrel (PLAVIX) 75 MG tablet Take 75 mg by mouth daily.   Yes [provider]  folic acid (FOLVITE) 1 MG tablet Take 1 mg by mouth daily.   Yes [provider]  loperamide (IMODIUM A-D) 2 MG tablet Take 2 mg by mouth as needed for diarrhea or loose stools.   Yes [provider]  MAGNESIUM PO Take 1 tablet by mouth daily.   Yes [provider]  metoprolol succinate (TOPROL-XL) 25 MG 24 hr tablet Take 12.5 mg by mouth daily.   Yes [provider]  mirtazapine (REMERON) 15 MG tablet Take 30 mg by mouth at bedtime.   Yes [provider]  nitroGLYCERIN (NITROSTAT) 0.4 MG SL tablet Place 0.4 mg under the tongue every 5 (five) minutes as needed for chest  pain. 08/08/21  Yes [provider]  pantoprazole (PROTONIX) 40 MG tablet Take 40 mg by mouth daily.   Yes [provider]  potassium chloride SA (KLOR-CON M) 20 MEQ tablet Take 20 mEq by mouth daily.   Yes [provider]  thiamine (VITAMIN B1) 100 MG tablet Take 100 mg by mouth daily.   Yes [provider]  lisinopril (ZESTRIL) 2.5 MG tablet Take 2.5 mg by mouth daily. Patient not taking: Reported on 09/16/2022    [provider]     Critical care time: 35 min

## 2022-09-17 NOTE — Procedures (Signed)
Extubation Procedure Note  Patient Details:   Name: Derek Oconnell DOB: 03-07-1955 MRN: [id number]   Airway Documentation:    Vent end date: 09/17/22 Vent end time: 1510   Evaluation  O2 sats: stable throughout Complications: No apparent complications Patient did tolerate procedure well. Bilateral Breath Sounds: Clear, Diminished   Yes  Pt extubated per physician order. Pt suctioned via ETT/orally prior and has a positive cuff leak. Upon extubation, pt able to speak name, give a good cough and no stridor heard at this time. Pt placed on 4L nasal cannula. RT will continue to monitor and be available as needed.   [name] 09/17/2022, 3:11 PM

## 2022-09-17 NOTE — Progress Notes (Signed)
No skin breakdown noted, DC LTM, notified [facility].

## 2022-09-17 NOTE — Progress Notes (Signed)
Maint done, no skin breakdown. Pc went offline for update. [facility] is still monitoring, study still online.

## 2022-09-17 NOTE — Plan of Care (Signed)

## 2022-09-17 NOTE — Progress Notes (Signed)
RT attempted SBT this am with pt. Pt placed in CPAP/PS 5/5 30%. Pt with adequate volumes but low RR of 5-7. Pt returned to full support at this time. RN aware. RT will continue to monitor and be available as needed.

## 2022-09-17 NOTE — Progress Notes (Signed)
Subjective: Sedation was weaned off yesterday.  No acute seizure-like events overnight.  Awake and following commands this morning.  Wife at bedside.  ROS: Unable to obtain due to intubation  Examination  Vital signs in last 24 hours: Temp:  [92.3 F (33.5 C)-99.5 F (37.5 C)] 98.2 F (36.8 C) (09/05 0724) Pulse Rate:  [60-96] 96 (09/05 1030) Resp:  [6-19] 16 (09/05 1030) BP: (74-124)/(46-76) 110/70 (09/05 1030) SpO2:  [74 %-100 %] 99 % (09/05 1030) FiO2 (%):  [3 %-30 %] 30 % (09/05 0800)  General: lying in bed, intubated Neuro: Awake, alert, following simple one-step commands, PERRLA, EOMI, antigravity strength in all 4 extremities  Basic Metabolic Panel: Recent Labs  Lab 09/15/22 1338 09/15/22 1555 09/15/22 2129 09/16/22 0538 09/16/22 1156 09/16/22 1744 09/17/22 0558  NA 140  --  136 137 137  --  138  K 2.7*  --  2.0* 3.7 4.7  --  4.2  CL 105  --  106 108 113*  --  114*  CO2 9*  --  15* 13* 14*  --  17*  GLUCOSE 107*  --  159* 112* 109*  --  116*  BUN 19  --  16 13 12   --  12  CREATININE 2.16*  --  1.81* 1.45* 1.54*  --  1.42*  CALCIUM 5.1*  --  4.6* 5.2* 6.9*  --  6.9*  MG 1.2*  --   --  2.5*  --  2.2 1.9  PHOS  --  3.7  --  3.3  --  2.2* 2.3*    CBC: Recent Labs  Lab 09/15/22 1338 09/16/22 0538 09/17/22 0558  WBC 12.2* 7.7 5.8  HGB 10.8* 7.1* 8.1*  HCT 33.2* 21.0* 24.9*  MCV 102.8* 98.6 102.5*  PLT 90* 78* 95*     Coagulation Studies: No results for input(s): "LABPROT", "INR" in the last 72 hours.  Imaging No new brain imaging overnight  ASSESSMENT AND PLAN:  67 year old male with a history of heavy EtOH use, presenting to the ED after recurrent spells of tonic flexor posturing of BUE with unresponsiveness. The posturing spells were preceded by a 3 day history of malaise, decreased PO intake and diarrhea. Last drink of Bourbon, per wife, was estimated to have been on Friday. He drinks about one 1.5 L bottle of Bourbon every 4-5 days and is a long-time  chronic drinker who "nurses" his drinks from approximately the afternoon to late evening/night every day. He has no history of seizures.    Seizure like activity Alcohol use disorder Chronic diarrhea Anemia Thrombocytopenia - No seizures overnight   Recommendations -Okay to use phenobarbital or Valium taper for alcohol withdrawal if needed - Hold off ASMs ( anti seizure meds) until we see ictal-interictal abnormality on eeg as seizure could have been provoked due to alcohol withdrawal - DC LTM EEG as no further seizures - MRI Brain wo contrast ordered and pending - Defer management of comorbidities to ICU team - Discussed plan with ICU team via secure chat and wife at bedside  I have spent a total of   36  minutes with the patient reviewing hospital notes,  test results, labs and examining the patient as well as establishing an assessment and plan that was discussed personally with the patient's wife.  > 50% of time was spent in direct patient care.    Lindie Spruce Epilepsy [facility] For questions after 5pm please refer to AMION to reach the Neurologist on call

## 2022-09-18 ENCOUNTER — Inpatient Hospital Stay (HOSPITAL_COMMUNITY): Payer: Medicare Other

## 2022-09-18 DIAGNOSIS — R569 Unspecified convulsions: Secondary | ICD-10-CM | POA: Diagnosis not present

## 2022-09-18 DIAGNOSIS — G40901 Epilepsy, unspecified, not intractable, with status epilepticus: Secondary | ICD-10-CM | POA: Diagnosis not present

## 2022-09-18 DIAGNOSIS — L89151 Pressure ulcer of sacral region, stage 1: Secondary | ICD-10-CM | POA: Insufficient documentation

## 2022-09-18 LAB — BASIC METABOLIC PANEL
Anion gap: 9 (ref 5–15)
BUN: 11 mg/dL (ref 8–23)
CO2: 16 mmol/L — ABNORMAL LOW (ref 22–32)
Calcium: 6.8 mg/dL — ABNORMAL LOW (ref 8.9–10.3)
Chloride: 112 mmol/L — ABNORMAL HIGH (ref 98–111)
Creatinine, Ser: 1.23 mg/dL (ref 0.61–1.24)
GFR, Estimated: >60 mL/min (ref >60)
Glucose, Bld: 87 mg/dL (ref 70–99)
Potassium: 3.9 mmol/L (ref 3.5–5.1)
Sodium: 137 mmol/L (ref 135–145)

## 2022-09-18 LAB — GLUCOSE, CAPILLARY
Glucose-Capillary: 101 mg/dL — ABNORMAL HIGH (ref 70–99)
Glucose-Capillary: 112 mg/dL — ABNORMAL HIGH (ref 70–99)
Glucose-Capillary: 127 mg/dL — ABNORMAL HIGH (ref 70–99)
Glucose-Capillary: 131 mg/dL — ABNORMAL HIGH (ref 70–99)
Glucose-Capillary: 91 mg/dL (ref 70–99)
Glucose-Capillary: 94 mg/dL (ref 70–99)

## 2022-09-18 LAB — CBC
HCT: 27.1 % — ABNORMAL LOW (ref 39.0–52.0)
Hemoglobin: 8.8 g/dL — ABNORMAL LOW (ref 13.0–17.0)
MCH: 34.6 pg — ABNORMAL HIGH (ref 26.0–34.0)
MCHC: 32.5 g/dL (ref 30.0–36.0)
MCV: 106.7 fL — ABNORMAL HIGH (ref 80.0–100.0)
Platelets: 99 10*3/uL — ABNORMAL LOW (ref 150–400)
RBC: 2.54 MIL/uL — ABNORMAL LOW (ref 4.22–5.81)
RDW: 17.8 % — ABNORMAL HIGH (ref 11.5–15.5)
WBC: 4.3 10*3/uL (ref 4.0–10.5)
nRBC: 0 % (ref 0.0–0.2)

## 2022-09-18 LAB — PHOSPHORUS: Phosphorus: 1.6 mg/dL — ABNORMAL LOW (ref 2.5–4.6)

## 2022-09-18 LAB — MAGNESIUM: Magnesium: 2 mg/dL (ref 1.7–2.4)

## 2022-09-18 MED ORDER — SODIUM PHOSPHATES 45 MMOLE/15ML IV SOLN
45.0000 mmol | Freq: Once | INTRAVENOUS | Status: AC
  Administered 2022-09-18: 45 mmol via INTRAVENOUS
  Filled 2022-09-18: qty 15

## 2022-09-18 MED ORDER — ASPIRIN 81 MG PO CHEW
81.0000 mg | CHEWABLE_TABLET | Freq: Every day | ORAL | Status: DC
  Administered 2022-09-18 – 2022-09-24 (×7): 81 mg
  Filled 2022-09-18 (×7): qty 1

## 2022-09-18 MED ORDER — CHLORHEXIDINE GLUCONATE CLOTH 2 % EX PADS
6.0000 | MEDICATED_PAD | CUTANEOUS | Status: DC
  Administered 2022-09-19 – 2022-09-23 (×5): 6 via TOPICAL

## 2022-09-18 MED ORDER — ATORVASTATIN CALCIUM 80 MG PO TABS
80.0000 mg | ORAL_TABLET | Freq: Every day | ORAL | Status: DC
  Administered 2022-09-18 – 2022-09-24 (×7): 80 mg
  Filled 2022-09-18 (×7): qty 1

## 2022-09-18 MED ORDER — JUVEN PO PACK
1.0000 | PACK | Freq: Two times a day (BID) | ORAL | Status: DC
  Administered 2022-09-18 – 2022-09-24 (×13): 1
  Filled 2022-09-18 (×13): qty 1

## 2022-09-18 MED ORDER — LACTATED RINGERS IV BOLUS
500.0000 mL | Freq: Once | INTRAVENOUS | Status: AC
  Administered 2022-09-18: 500 mL via INTRAVENOUS

## 2022-09-18 MED ORDER — LOPERAMIDE HCL 1 MG/7.5ML PO SUSP
2.0000 mg | ORAL | Status: AC | PRN
  Administered 2022-09-18 – 2022-09-20 (×3): 2 mg
  Filled 2022-09-18 (×3): qty 15

## 2022-09-18 MED ORDER — POTASSIUM CHLORIDE 20 MEQ PO PACK
20.0000 meq | PACK | Freq: Once | ORAL | Status: AC
  Administered 2022-09-18: 20 meq
  Filled 2022-09-18: qty 1

## 2022-09-18 NOTE — Progress Notes (Signed)
eLink Physician-Brief Progress Note Patient Name: Derek Oconnell DOB: 04-18-1955 MRN: [id number]   Date of Service  09/18/2022  HPI/Events of Note  67 year old man received in transfer from [facility] for status epilepticus requiring LTM.  Presented to [facility] with witnessed seizure activity without regaining consciousness.  Prior history of alcohol abuse, HFrEF with ICD, COPD.   Frequent diarrhea, 4 loose bowel movements today.  Multiple electrolyte disturbances no evidence of C. difficile  eICU Interventions  Imodium x 1        Derek Oconnell 09/18/2022, 10:12 PM

## 2022-09-18 NOTE — Progress Notes (Signed)
An USGPIV (ultrasound guided PIV) has been placed for short-term vasopressor infusion. A correctly placed ivWatch must be used when administering Vasopressors. Should this treatment be needed beyond 24 hours, central line access should be obtained.  It will be the responsibility of the bedside nurse to follow best practice to prevent extravasations.

## 2022-09-18 NOTE — Plan of Care (Signed)
  Problem: Education: Goal: Knowledge of General Education information will improve Description: Including pain rating scale, medication(s)/side effects and non-pharmacologic comfort measures Outcome: Not Progressing   Problem: Health Behavior/Discharge Planning: Goal: Ability to manage health-related needs will improve Outcome: Not Progressing   Problem: Clinical Measurements: Goal: Ability to maintain clinical measurements within normal limits will improve Outcome: Not Progressing Goal: Will remain free from infection Outcome: Progressing Goal: Diagnostic test results will improve Outcome: Progressing

## 2022-09-18 NOTE — Evaluation (Signed)
Clinical/Bedside Swallow Evaluation Patient Details  Name: Derek Oconnell MRN: [id number] Date of Birth: 23-Aug-1955  Today's Date: 09/18/2022 Time: SLP Start Time (ACUTE ONLY): 0822 SLP Stop Time (ACUTE ONLY): 0832 SLP Time Calculation (min) (ACUTE ONLY): 10 min  Past Medical History:  Past Medical History:  Diagnosis Date   Myocardial infarction (HCC) 1989   Past Surgical History:  Past Surgical History:  Procedure Laterality Date   FEMORAL-POPLITEAL BYPASS GRAFT Bilateral 2021   GALLBLADDER SURGERY  2019   VASECTOMY  [id number]   HPI:  67 year old male presented to [facility] 9/3 with AMS and seizure-like activity.  In ED, he had persistent AMS along with additional seizure-like activity.  He was ultimately intubated and sedated.  He was later transferred to [facility] for further evaluation management including LTM.  ETT 9/3-5. CXR 9/3 clear.  Head CT 9/3 with no acute findings. Pt with pmhx HTN, CHF, CAD s/p stenting and PPM, COPD, EtOH dependence.    Assessment / Plan / Recommendation  Clinical Impression  Pt presents with clinical indicators of pharyngeal dysphagia.  SLP provided oral care prior to administration of PO trials.  Pt is edentulous.  His dentures are not present but he states he does not wear them to eat.  Pt exhibited immediate cough on all trials.  Pt consumed thin liquid by spoon and small amount of puree.  He denies prior pneumonia or symptoms of dysphagia.  It is unclear if this is a post extubation dysphagia, or acute on chronic dysphagia.  Pt would benefit from instrumental evaluation prior to initiation of diet. At this time, recommend holding instrumental given severity of symptoms, as he does not seem ready to initiate a PO diet and has cortrak in place.  When pt is ready for swallow study, recommend MBS over FEES given quasi-invasive nature of study.  Pt was resistant to oral care and suspect poor tolerance of scope passage.  Recommend pt remain NPO with alternate means of nutrition,  hydration, and medication. Pt may have small amounts of water (1/2 tsp or less) by spoon, after good oral care, in moderation, when fully awake/alert, with upright positioning and 1:1 assistance. SLP Visit Diagnosis: Dysphagia, unspecified (R13.10)    Aspiration Risk  Moderate aspiration risk    Diet Recommendation NPO    Medication Administration: Via alternative means    Other  Recommendations Oral Care Recommendations: Oral care QID;Oral care prior to ice chip/H20    Recommendations for follow up therapy are one component of a multi-disciplinary discharge planning process, led by the attending physician.  Recommendations may be updated based on patient status, additional functional criteria and insurance authorization.  Follow up Recommendations Skilled nursing-short term rehab (<3 hours/day)      Assistance Recommended at Discharge  N/A  Functional Status Assessment Patient has had a recent decline in their functional status and demonstrates the ability to make significant improvements in function in a reasonable and predictable amount of time.  Frequency and Duration min 2x/week  2 weeks       Prognosis Prognosis for improved oropharyngeal function: Good      Swallow Study   General Date of Onset: 09/15/22 HPI: 67 year old male presented to [facility] 9/3 with AMS and seizure-like activity.  In ED, he had persistent AMS along with additional seizure-like activity.  He was ultimately intubated and sedated.  He was later transferred to [facility] for further evaluation management including LTM.  ETT 9/3-5. CXR 9/3 clear.  Head CT 9/3 with no acute findings. Pt  with pmhx HTN, CHF, CAD s/p stenting and PPM, COPD, EtOH dependence. Type of Study: Bedside Swallow Evaluation Previous Swallow Assessment: none Diet Prior to this Study: NPO;Cortrak/Small bore NG tube Temperature Spikes Noted: No Respiratory Status: Nasal cannula History of Recent Intubation: Yes Total duration of intubation (days):  2 days Date extubated: 09/17/22 Behavior/Cognition: Alert;Cooperative;Confused Oral Cavity Assessment: Within Functional Limits Oral Care Completed by SLP: Yes Oral Cavity - Dentition: Edentulous Self-Feeding Abilities: Total assist;Needs assist Patient Positioning: Upright in bed Baseline Vocal Quality: Low vocal intensity Volitional Cough: Weak Volitional Swallow: Able to elicit    Oral/Motor/Sensory Function Overall Oral Motor/Sensory Function: Within functional limits Facial ROM: Within Functional Limits Facial Symmetry: Within Functional Limits Lingual ROM: Within Functional Limits Lingual Symmetry: Within Functional Limits Lingual Strength: Reduced Velum: Within Functional Limits Mandible: Within Functional Limits   Ice Chips Ice chips: Not tested   Thin Liquid Thin Liquid: Impaired Presentation: Spoon Pharyngeal  Phase Impairments: Cough - Immediate    Nectar Thick Nectar Thick Liquid: Not tested   Honey Thick Honey Thick Liquid: Not tested   Puree Puree: Impaired Pharyngeal Phase Impairments: Cough - Immediate   Solid     Solid: Not tested      Kerrie Pleasure, MA, CCC-SLP Acute Rehabilitation Services Office: [phone] 09/18/2022,9:19 AM

## 2022-09-18 NOTE — Progress Notes (Signed)
NAME:  Derek Oconnell, MRN:  [id number], DOB:  1955/09/16, LOS: 3 ADMISSION DATE:  09/15/2022, CONSULTATION DATE:  9/3 REFERRING MD:  [facility], CHIEF COMPLAINT:  Siezures   History of Present Illness:  67 year old male presented to [facility] 9/3 with AMS and seizure-like activity. In ED, he had persistent AMS along with additional seizure-like activity.  He was ultimately intubated and sedated.  He was later transferred to [facility] for further evaluation management including LTM.  Extubated yesterday. No seizure activity on EEG. Awake and alert, however remains confused. Oriented to self and place only.  Pertinent  Medical History  HTN, CHF, CAD s/p stenting and PPM, COPD, EtOH dependence, sacral pressure injury  Significant Hospital Events: Including procedures, antibiotic start and stop dates in addition to other pertinent events   9/3 admitted and intubated at outside hospital  9/5 extubated  Interim History / Subjective:  Patient is awake. Engages in conversation but difficult to understand 2/2 mumbling.  Objective   Blood pressure 91/66, pulse (!) 101, temperature 97.9 F (36.6 C), temperature source Oral, resp. rate 13, height 5\' 10"  (1.778 m), weight 68.1 kg, SpO2 97%.    Vent Mode: PRVC FiO2 (%):  [30 %] 30 % Set Rate:  [16 bmp] 16 bmp Vt Set:  [420 mL] 420 mL PEEP:  [5 cmH20] 5 cmH20   Intake/Output Summary (Last 24 hours) at 09/18/2022 0816 Last data filed at 09/18/2022 0700 Gross per 24 hour  Intake 2274.69 ml  Output 1530 ml  Net 744.69 ml   Filed Weights   09/16/22 0430 09/18/22 0545  Weight: 55.5 kg 68.1 kg    Examination: General: NAD, chronically ill-appearing, falls asleep but easily arousable Lungs: Referred upper airway sounds, no wheezing, normal WOB on RA. Cardiovascular: RRR, no murmurs Abdomen: Soft, not tender, not distended. BS + Extremities: No edema, moving all 4 extremities Neuro: Awake and alert, oriented to person and place. Not oriented to time nor event. No  overt focal neurologic deficits.  Resolved Hospital Problem list   Acute hypoxic respiratory failure  Hypotension   Assessment & Plan:  Seizure-like activity  Confusion Patient is awake, no seizure activity. Mild confusion this AM.  Still suspect related to alcohol withdraw. Patient should be past withdraw symptoms, confusion may be related to delirium or chronic alcohol abuse (evident by malnutrition). -Neuro consulted, appreciate recs -Pending MRI  Hypotension Stopped pressors this AM. If his BP remains stable, he can transfer out of ICU  Protein malnutrition Hypocalcemia Hypomagnesia  Did not pass bedside swallow. SLP recommend NPO until patient able to get MBS. High risk of refeeding syndrome. Suspect macro/micronutrient deficiencies related to alcohol use- question if vitamin B deficiency is playing role in confusion. -RD consult, appreciate assistance with tube feeds -Refeeding labs -Replete electrolytes as appropriate -Thiamine, multivitamin -PT/OT  Hx EtOH dependence  Should be past alcohol withdraw. Did have CIWA of 10 overnight. Will keep CIWA on today, but can D/C once he is consistently 2 or less. Poor insight into his alcohol use.  -TOC for cessation -CIWA w/ ativan  Best Practice (right click and "Reselect all SmartList Selections" daily)   Diet/type: tubefeeds DVT prophylaxis: prophylactic heparin  GI prophylaxis: PPI Lines: N/A Foley:  N/A Code Status:  full code Last date of multidisciplinary goals of care discussion [Pending]  Labs   CBC: Recent Labs  Lab 09/15/22 1338 09/16/22 0538 09/17/22 0558  WBC 12.2* 7.7 5.8  HGB 10.8* 7.1* 8.1*  HCT 33.2* 21.0* 24.9*  MCV 102.8* 98.6  102.5*  PLT 90* 78* 95*    Basic Metabolic Panel: Recent Labs  Lab 09/15/22 1338 09/15/22 1555 09/15/22 2129 09/16/22 0538 09/16/22 1156 09/16/22 1744 09/17/22 0558 09/17/22 1755  NA 140  --  136 137 137  --  138  --   K 2.7*  --  2.0* 3.7 4.7  --  4.2  --   CL  105  --  106 108 113*  --  114*  --   CO2 9*  --  15* 13* 14*  --  17*  --   GLUCOSE 107*  --  159* 112* 109*  --  116*  --   BUN 19  --  16 13 12   --  12  --   CREATININE 2.16*  --  1.81* 1.45* 1.54*  --  1.42*  --   CALCIUM 5.1*  --  4.6* 5.2* 6.9*  --  6.9*  --   MG 1.2*  --   --  2.5*  --  2.2 1.9 2.2  PHOS  --  3.7  --  3.3  --  2.2* 2.3* 2.9   GFR: Estimated Creatinine Clearance: 49.3 mL/min (A) (by C-G formula based on SCr of 1.42 mg/dL (H)). Recent Labs  Lab 09/15/22 1338 09/15/22 1555 09/15/22 2129 09/16/22 0538 09/17/22 0558  WBC 12.2*  --   --  7.7 5.8  LATICACIDVEN >9.0* 4.0* 1.0  --   --     Liver Function Tests: Recent Labs  Lab 09/15/22 1555  AST 23  ALT 8  ALKPHOS 89  BILITOT 0.7  PROT 4.5*  ALBUMIN 1.7*   Recent Labs  Lab 09/15/22 1555  LIPASE 38   No results for input(s): "AMMONIA" in the last 168 hours.  ABG    Component Value Date/Time   PHART 7.34 (L) 09/16/2022 0522   PCO2ART 23 (L) 09/16/2022 0522   PO2ART 199 (H) 09/16/2022 0522   HCO3 12.7 (L) 09/16/2022 0522   ACIDBASEDEF 11.9 (H) 09/16/2022 0522   O2SAT 100 09/16/2022 0522     Coagulation Profile: No results for input(s): "INR", "PROTIME" in the last 168 hours.  Cardiac Enzymes: No results for input(s): "CKTOTAL", "CKMB", "CKMBINDEX", "TROPONINI" in the last 168 hours.  HbA1C: No results found for: "HGBA1C"  CBG: Recent Labs  Lab 09/17/22 1525 09/17/22 1939 09/17/22 2319 09/18/22 0346 09/18/22 0743  GLUCAP 120* 135* 135* 127* 101*    Past Medical History:  He,  has a past medical history of Myocardial infarction (HCC) (1989).   Surgical History:   Past Surgical History:  Procedure Laterality Date   FEMORAL-POPLITEAL BYPASS GRAFT Bilateral 2021   GALLBLADDER SURGERY  2019   VASECTOMY  1992     Social History:   reports that he has been smoking cigarettes. He uses smokeless tobacco. He reports current alcohol use.   Family History:  His family history is  not on file.   Allergies No Known Allergies   Home Medications  Prior to Admission medications   Medication Sig Start Date End Date Taking? Authorizing Provider  aspirin EC 81 MG tablet Take 81 mg by mouth daily. Swallow whole.   Yes [provider]  atorvastatin (LIPITOR) 80 MG tablet Take 80 mg by mouth daily.   Yes [provider]  buPROPion (WELLBUTRIN XL) 150 MG 24 hr tablet Take 150 mg by mouth daily.   Yes [provider]  clopidogrel (PLAVIX) 75 MG tablet Take 75 mg by mouth daily.   Yes  [provider]  folic acid (FOLVITE) 1 MG tablet Take 1 mg by mouth daily.   Yes [provider]  loperamide (IMODIUM A-D) 2 MG tablet Take 2 mg by mouth as needed for diarrhea or loose stools.   Yes [provider]  MAGNESIUM PO Take 1 tablet by mouth daily.   Yes [provider]  metoprolol succinate (TOPROL-XL) 25 MG 24 hr tablet Take 12.5 mg by mouth daily.   Yes [provider]  mirtazapine (REMERON) 15 MG tablet Take 30 mg by mouth at bedtime.   Yes [provider]  nitroGLYCERIN (NITROSTAT) 0.4 MG SL tablet Place 0.4 mg under the tongue every 5 (five) minutes as needed for chest pain. 08/08/21  Yes [provider]  pantoprazole (PROTONIX) 40 MG tablet Take 40 mg by mouth daily.   Yes [provider]  potassium chloride SA (KLOR-CON M) 20 MEQ tablet Take 20 mEq by mouth daily.   Yes [provider]  thiamine (VITAMIN B1) 100 MG tablet Take 100 mg by mouth daily.   Yes [provider]  lisinopril (ZESTRIL) 2.5 MG tablet Take 2.5 mg by mouth daily. Patient not taking: Reported on 09/16/2022    [provider]     Critical care time: 35 min

## 2022-09-18 NOTE — Evaluation (Signed)
Physical Therapy Evaluation Patient Details Name: Derek Oconnell MRN: [id number] DOB: 10-02-1955 Today's Date: 09/18/2022  History of Present Illness  67 y.o. male admitted 9/3 with unresponsive episode at home with seizure like activity. Intubated 9/3-9/5. 9/4 EEG with encephalopathy. PMhx: HTN, HFrEF with EF 25-30%, CAD, COPD, HTN, biventricular ICD, heavy EtOH use  Clinical Impression  Pt with flat affect, muffled speech and limited strength and cognition. Pt reports living with wife in 3 story house, having to perform stairs daily and was independent PTA. 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 Pt currently with min-mod assist for basic transfers, HR 150 with limited stepping 5' and heavy reliance on RW. Pt not oriented to day or month with lack of awareness for deficits. Pt with above and below deficits who will benefit from acute therapy to maximize mobility, independence and safety. Patient will benefit from continued inpatient follow up therapy, <3 hours/day however pt states he will decline. Wife not present to state assist able to be provided at D/C.  HR 125 at rest up to 150 with limited gait SPO2 92-97% on RA         If plan is discharge home, recommend the following: A lot of help with walking and/or transfers;A lot of help with bathing/dressing/bathroom;Assistance with cooking/housework;Direct supervision/assist for medications management;Assist for transportation;Supervision due to cognitive status;Assistance with feeding;Direct supervision/assist for financial management;Help with stairs or ramp for entrance   Can travel by private vehicle   Yes    Equipment Recommendations Rolling walker (2 wheels);BSC/3in1;Wheelchair (measurements PT)  Recommendations for Other Services       Functional Status Assessment Patient has had a recent decline in their functional status and demonstrates the ability to make significant improvements in function in a  reasonable and predictable amount of time.     Precautions / Restrictions Precautions Precautions: Fall;Other (comment) Precaution Comments: stool incontinence, cortrak      Mobility  Bed Mobility Overal bed mobility: Needs Assistance Bed Mobility: Supine to Sit     Supine to sit: Min assist, HOB elevated     General bed mobility comments: HOB 20 degrees, increased time, physical assist to initiate moving legs to EOB and to elevate trunk. Mod cues for transition    Transfers Overall transfer level: Needs assistance   Transfers: Sit to/from Stand Sit to Stand: Min assist           General transfer comment: min assist to rise from bed x 2 trials with max cues for hand placement, pt maintains crouched posture, unaware of stool incontinence or foley    Ambulation/Gait Ambulation/Gait assistance: Min assist, +2 safety/equipment Gait Distance (Feet): 5 Feet Assistive device: Rolling walker (2 wheels) Gait Pattern/deviations: Step-to pattern, Trunk flexed, Narrow base of support   Gait velocity interpretation: <1.8 ft/sec, indicate of risk for recurrent falls   General Gait Details: pt with narrow BOS, short steps and flexed trunk with HR 150 with limited gait and need for chair follow due to fatigue. heavy reliance on RW  Stairs            Wheelchair Mobility     Tilt Bed    Modified Rankin (Stroke Patients Only)       Balance Overall balance assessment: Needs assistance Sitting-balance support: Feet supported, No upper extremity supported Sitting balance-Leahy Scale: Fair     Standing balance support: Bilateral upper extremity supported, During functional activity, Reliant on assistive device for balance Standing balance-Leahy Scale: Poor  Pertinent Vitals/Pain Pain Assessment Pain Assessment: No/denies pain    Home Living Family/patient expects to be discharged to:: Private residence Living  Arrangements: Spouse/significant other Available Help at Discharge: Family;Available 24 hours/day Type of Home: House Home Access: Stairs to enter   [employer] of Steps: 7 Alternate Level Stairs-Number of Steps: flight Home Layout: Multi-level;Bed/bath upstairs Home Equipment: None      Prior Function Prior Level of Function : Independent/Modified Independent                     Extremity/Trunk Assessment   Upper Extremity Assessment Upper Extremity Assessment: Generalized weakness    Lower Extremity Assessment Lower Extremity Assessment: Generalized weakness    Cervical / Trunk Assessment Cervical / Trunk Assessment: Kyphotic  Communication   Communication Communication: No apparent difficulties;Other (comment) (mumbled speech, difficult to understand at times) Cueing Techniques: Verbal cues;Tactile cues  Cognition Arousal: Alert Behavior During Therapy: Flat affect Overall Cognitive Status: Impaired/Different from baseline Area of Impairment: Orientation, Attention, Following commands, Safety/judgement, Problem solving, Awareness                 Orientation Level: Disoriented to, Time Current Attention Level: Sustained   Following Commands: Follows one step commands with increased time, Follows one step commands consistently Safety/Judgement: Decreased awareness of deficits, Decreased awareness of safety   Problem Solving: Slow processing, Requires verbal cues, Requires tactile cues, Decreased initiation          General Comments      Exercises     Assessment/Plan    PT Assessment Patient needs continued PT services  PT Problem List Decreased strength;Decreased mobility;Decreased safety awareness;Decreased activity tolerance;Decreased cognition;Decreased balance;Decreased knowledge of use of DME       PT Treatment Interventions DME instruction;Gait training;Stair training;Functional mobility training;Therapeutic  activities;Patient/family education;Cognitive remediation;Neuromuscular re-education;Balance training;Therapeutic exercise    PT Goals (Current goals can be found in the Care Plan section)  Acute Rehab PT Goals Patient Stated Goal: return home PT Goal Formulation: With patient Time For Goal Achievement: 10/02/22 Potential to Achieve Goals: Good    Frequency Min 1X/week     Co-evaluation               AM-PAC PT "6 Clicks" Mobility  Outcome Measure Help needed turning from your back to your side while in a flat bed without using bedrails?: A Little Help needed moving from lying on your back to sitting on the side of a flat bed without using bedrails?: A Little Help needed moving to and from a bed to a chair (including a wheelchair)?: A Lot Help needed standing up from a chair using your arms (e.g., wheelchair or bedside chair)?: A Little Help needed to walk in hospital room?: A Lot Help needed climbing 3-5 steps with a railing? : Total 6 Click Score: 14    End of Session Equipment Utilized During Treatment: Gait belt Activity Tolerance: Patient tolerated treatment well Patient left: in chair;with call bell/phone within reach;with chair alarm set Nurse Communication: Mobility status PT Visit Diagnosis: Other abnormalities of gait and mobility (R26.89);Muscle weakness (generalized) (M62.81);Difficulty in walking, not elsewhere classified (R26.2)    Time: 2956-2130 PT Time Calculation (min) (ACUTE ONLY): 22 min   Charges:   PT Evaluation $PT Eval Moderate Complexity: 1 Mod   PT General Charges $$ ACUTE PT VISIT: 1 Visit         Merryl Hacker, PT Acute Rehabilitation Services Office: [phone]   Enedina Finner Keimora Swartout 09/18/2022, 9:36 AM

## 2022-09-18 NOTE — Progress Notes (Signed)
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 No definite evidence of hemorrhage.  eICU Interventions  No immediate change in plan, continue observation     Intervention Category Minor Interventions: Clinical assessment - ordering diagnostic tests  Malorie Bigford 09/18/2022, 7:48 PM

## 2022-09-18 NOTE — Progress Notes (Signed)
Subjective: No acute events overnight.  Extubated and sitting on chair.  ROS: negative except above  Examination  Vital signs in last 24 hours: Temp:  [97.9 F (36.6 C)-100.9 F (38.3 C)] 97.9 F (36.6 C) (09/06 0747) Pulse Rate:  [82-150] 100 (09/06 1000) Resp:  [10-22] 17 (09/06 1000) BP: (71-119)/(56-87) 104/71 (09/06 1000) SpO2:  [78 %-100 %] 95 % (09/06 1000) FiO2 (%):  [30 %] 30 % (09/05 1200) Weight:  [68.1 kg] 68.1 kg (09/06 0545)  General: Sitting in chair, not in apparent distress Neuro: AOx3, cranial nerves II developing grossly intact, no aphasia, moderate dysarthria, 4/5 in all 4 extremities, FTN intact bilaterally, sensation intact to light touch  Basic Metabolic Panel: Recent Labs  Lab 09/15/22 2129 09/16/22 0538 09/16/22 1156 09/16/22 1744 09/17/22 0558 09/17/22 1755 09/18/22 0750  NA 136 137 137  --  138  --  137  K 2.0* 3.7 4.7  --  4.2  --  3.9  CL 106 108 113*  --  114*  --  112*  CO2 15* 13* 14*  --  17*  --  16*  GLUCOSE 159* 112* 109*  --  116*  --  87  BUN 16 13 12   --  12  --  11  CREATININE 1.81* 1.45* 1.54*  --  1.42*  --  1.23  CALCIUM 4.6* 5.2* 6.9*  --  6.9*  --  6.8*  MG  --  2.5*  --  2.2 1.9 2.2 2.0  PHOS  --  3.3  --  2.2* 2.3* 2.9 1.6*    CBC: Recent Labs  Lab 09/15/22 1338 09/16/22 0538 09/17/22 0558 09/18/22 0750  WBC 12.2* 7.7 5.8 4.3  HGB 10.8* 7.1* 8.1* 8.8*  HCT 33.2* 21.0* 24.9* 27.1*  MCV 102.8* 98.6 102.5* 106.7*  PLT 90* 78* 95* 99*     Coagulation Studies: No results for input(s): "LABPROT", "INR" in the last 72 hours.  Imaging No new brain imaging   ASSESSMENT AND PLAN: 67 year old male with a history of heavy EtOH use, presenting to the ED after recurrent spells of tonic flexor posturing of BUE with unresponsiveness. The posturing spells were preceded by a 3 day history of malaise, decreased PO intake and diarrhea. Last drink of Bourbon, per wife, was estimated to have been on Friday. He drinks about one  1.5 L bottle of Bourbon every 4-5 days and is a long-time chronic drinker who "nurses" his drinks from approximately the afternoon to late evening/night every day. He has no history of seizures.    Seizure like activity Alcohol use disorder Chronic diarrhea Anemia Thrombocytopenia - No seizures overnight   Recommendations -Will not start seizure medications at this was most likely provoked seizure in the setting of alcohol withdrawal - MRI Brain wo contrast ordered and pending -Counseled patient against alcohol use -Discussed seizure precautions including do not drive for 6 months -If MRI brain without contrast does not show any acute abnormality, neurology will sign off. - Defer management of comorbidities to ICU team - Discussed plan with wife at bedside  Seizure precautions: Per [facility] statutes, patients with seizures are not allowed to drive until they have been seizure-free for six months and cleared by a physician    Use caution when using heavy equipment or power tools. Avoid working on ladders or at heights. Take showers instead of baths. Ensure the water temperature is not too high on the home water heater. Do not go swimming alone.  Do not lock yourself in a room alone (i.e. bathroom). When caring for infants or small children, sit down when holding, feeding, or changing them to minimize risk of injury to the child in the event you have a seizure. Maintain good sleep hygiene. Avoid alcohol.    If patient has another seizure, call 911 and bring them back to the ED if: A.  The seizure lasts longer than 5 minutes.      B.  The patient doesn't wake shortly after the seizure or has new problems such as difficulty seeing, speaking or moving following the seizure C.  The patient was injured during the seizure D.  The patient has a temperature over 102 F (39C) E.  The patient vomited during the seizure and now is having trouble breathing    During the Seizure   - First,  ensure adequate ventilation and place patients on the floor on their left side  Loosen clothing around the neck and ensure the airway is patent. If the patient is clenching the teeth, do not force the mouth open with any object as this can cause severe damage - Remove all items from the surrounding that can be hazardous. The patient may be oblivious to what's happening and may not even know what he or she is doing. If the patient is confused and wandering, either gently guide him/her away and block access to outside areas - Reassure the individual and be comforting - Call 911. In most cases, the seizure ends before EMS arrives. However, there are cases when seizures may last over 3 to 5 minutes. Or the individual may have developed breathing difficulties or severe injuries. If a pregnant patient or a person with diabetes develops a seizure, it is prudent to call an ambulance.   After the Seizure (Postictal Stage)   After a seizure, most patients experience confusion, fatigue, muscle pain and/or a headache. Thus, one should permit the individual to sleep. For the next few days, reassurance is essential. Being calm and helping reorient the person is also of importance.   Most seizures are painless and end spontaneously. Seizures are not harmful to others but can lead to complications such as stress on the lungs, brain and the heart. Individuals with prior lung problems may develop labored breathing and respiratory distress.    I have spent a total of   37  minutes with the patient reviewing hospital notes,  test results, labs and examining the patient as well as establishing an assessment and plan that was discussed personally with the p    Treg Diemer Epilepsy [facility] For questions after 5pm please refer to AMION to reach the Neurologist on call

## 2022-09-18 NOTE — Progress Notes (Signed)
Programmed patient with [employer] rep, Joey, to all off. Joey was in contact with cardiology PA, Renee.  Patient's bedside nurse is monitoring during MRI scan.  After scan will program back to regular settings and send information to rep and PA.

## 2022-09-19 ENCOUNTER — Other Ambulatory Visit: Payer: Self-pay

## 2022-09-19 ENCOUNTER — Inpatient Hospital Stay (HOSPITAL_COMMUNITY): Payer: Medicare Other

## 2022-09-19 DIAGNOSIS — I639 Cerebral infarction, unspecified: Secondary | ICD-10-CM

## 2022-09-19 DIAGNOSIS — I959 Hypotension, unspecified: Secondary | ICD-10-CM

## 2022-09-19 DIAGNOSIS — G40901 Epilepsy, unspecified, not intractable, with status epilepticus: Secondary | ICD-10-CM | POA: Diagnosis not present

## 2022-09-19 DIAGNOSIS — G9340 Encephalopathy, unspecified: Secondary | ICD-10-CM

## 2022-09-19 DIAGNOSIS — E440 Moderate protein-calorie malnutrition: Secondary | ICD-10-CM

## 2022-09-19 DIAGNOSIS — R5381 Other malaise: Secondary | ICD-10-CM

## 2022-09-19 LAB — URINALYSIS, ROUTINE W REFLEX MICROSCOPIC
Bilirubin Urine: NEGATIVE
Glucose, UA: NEGATIVE mg/dL
Hgb urine dipstick: NEGATIVE
Ketones, ur: NEGATIVE mg/dL
Leukocytes,Ua: NEGATIVE
Nitrite: NEGATIVE
Protein, ur: NEGATIVE mg/dL
Specific Gravity, Urine: 1.017 (ref 1.005–1.030)
pH: 7 (ref 5.0–8.0)

## 2022-09-19 LAB — COMPREHENSIVE METABOLIC PANEL
ALT: 11 U/L (ref 0–44)
AST: 19 U/L (ref 15–41)
Albumin: 2 g/dL — ABNORMAL LOW (ref 3.5–5.0)
Alkaline Phosphatase: 79 U/L (ref 38–126)
Anion gap: 13 (ref 5–15)
BUN: 21 mg/dL (ref 8–23)
CO2: 20 mmol/L — ABNORMAL LOW (ref 22–32)
Calcium: 7.5 mg/dL — ABNORMAL LOW (ref 8.9–10.3)
Chloride: 107 mmol/L (ref 98–111)
Creatinine, Ser: 1.07 mg/dL (ref 0.61–1.24)
GFR, Estimated: >60 mL/min (ref >60)
Glucose, Bld: 94 mg/dL (ref 70–99)
Potassium: 3.8 mmol/L (ref 3.5–5.1)
Sodium: 140 mmol/L (ref 135–145)
Total Bilirubin: 0.7 mg/dL (ref 0.3–1.2)
Total Protein: 5.5 g/dL — ABNORMAL LOW (ref 6.5–8.1)

## 2022-09-19 LAB — GLUCOSE, CAPILLARY
Glucose-Capillary: 102 mg/dL — ABNORMAL HIGH (ref 70–99)
Glucose-Capillary: 108 mg/dL — ABNORMAL HIGH (ref 70–99)
Glucose-Capillary: 115 mg/dL — ABNORMAL HIGH (ref 70–99)
Glucose-Capillary: 124 mg/dL — ABNORMAL HIGH (ref 70–99)
Glucose-Capillary: 125 mg/dL — ABNORMAL HIGH (ref 70–99)
Glucose-Capillary: 129 mg/dL — ABNORMAL HIGH (ref 70–99)

## 2022-09-19 LAB — LACTIC ACID, PLASMA
Lactic Acid, Venous: 2.4 mmol/L (ref 0.5–1.9)
Lactic Acid, Venous: 3.1 mmol/L (ref 0.5–1.9)
Lactic Acid, Venous: 2.3 mmol/L (ref 0.5–1.9)

## 2022-09-19 LAB — PHOSPHORUS: Phosphorus: 2.1 mg/dL — ABNORMAL LOW (ref 2.5–4.6)

## 2022-09-19 LAB — CBC
HCT: 28.9 % — ABNORMAL LOW (ref 39.0–52.0)
Hemoglobin: 9.4 g/dL — ABNORMAL LOW (ref 13.0–17.0)
MCH: 34.2 pg — ABNORMAL HIGH (ref 26.0–34.0)
MCHC: 32.5 g/dL (ref 30.0–36.0)
MCV: 105.1 fL — ABNORMAL HIGH (ref 80.0–100.0)
Platelets: 110 10*3/uL — ABNORMAL LOW (ref 150–400)
RBC: 2.75 MIL/uL — ABNORMAL LOW (ref 4.22–5.81)
RDW: 18.4 % — ABNORMAL HIGH (ref 11.5–15.5)
WBC: 5.7 10*3/uL (ref 4.0–10.5)
nRBC: 0 % (ref 0.0–0.2)

## 2022-09-19 LAB — HEMOGLOBIN A1C
Hgb A1c MFr Bld: 5.1 % (ref 4.8–5.6)
Mean Plasma Glucose: 99.67 mg/dL

## 2022-09-19 LAB — BASIC METABOLIC PANEL
Anion gap: 10 (ref 5–15)
BUN: 19 mg/dL (ref 8–23)
CO2: 21 mmol/L — ABNORMAL LOW (ref 22–32)
Calcium: 7 mg/dL — ABNORMAL LOW (ref 8.9–10.3)
Chloride: 109 mmol/L (ref 98–111)
Creatinine, Ser: 1.03 mg/dL (ref 0.61–1.24)
GFR, Estimated: >60 mL/min (ref >60)
Glucose, Bld: 128 mg/dL — ABNORMAL HIGH (ref 70–99)
Potassium: 3.3 mmol/L — ABNORMAL LOW (ref 3.5–5.1)
Sodium: 140 mmol/L (ref 135–145)

## 2022-09-19 LAB — MAGNESIUM: Magnesium: 1.5 mg/dL — ABNORMAL LOW (ref 1.7–2.4)

## 2022-09-19 LAB — EXPECTORATED SPUTUM ASSESSMENT W GRAM STAIN, RFLX TO RESP C

## 2022-09-19 LAB — PROCALCITONIN: Procalcitonin: 0.32 ng/mL

## 2022-09-19 LAB — AMMONIA: Ammonia: 35 umol/L (ref 9–35)

## 2022-09-19 MED ORDER — POTASSIUM PHOSPHATES 15 MMOLE/5ML IV SOLN
30.0000 mmol | Freq: Once | INTRAVENOUS | Status: AC
  Administered 2022-09-19: 30 mmol via INTRAVENOUS
  Filled 2022-09-19: qty 10

## 2022-09-19 MED ORDER — MAGNESIUM SULFATE 2 GM/50ML IV SOLN
2.0000 g | Freq: Once | INTRAVENOUS | Status: AC
  Administered 2022-09-19: 2 g via INTRAVENOUS
  Filled 2022-09-19: qty 50

## 2022-09-19 MED ORDER — STROKE: EARLY STAGES OF RECOVERY BOOK
Freq: Once | Status: AC
  Filled 2022-09-19: qty 1

## 2022-09-19 MED ORDER — SODIUM CHLORIDE 0.9 % IV SOLN
3.0000 g | Freq: Four times a day (QID) | INTRAVENOUS | Status: DC
  Administered 2022-09-19 – 2022-09-21 (×6): 3 g via INTRAVENOUS
  Filled 2022-09-19 (×6): qty 8

## 2022-09-19 MED ORDER — GUAIFENESIN ER 600 MG PO TB12
600.0000 mg | ORAL_TABLET | Freq: Two times a day (BID) | ORAL | Status: DC | PRN
  Administered 2022-09-19: 600 mg via ORAL
  Filled 2022-09-19: qty 1

## 2022-09-19 MED ORDER — IOHEXOL 350 MG/ML SOLN
75.0000 mL | Freq: Once | INTRAVENOUS | Status: AC | PRN
  Administered 2022-09-19: 75 mL via INTRAVENOUS

## 2022-09-19 MED ORDER — MIRTAZAPINE 15 MG PO TABS
30.0000 mg | ORAL_TABLET | Freq: Every day | ORAL | Status: DC
  Administered 2022-09-19 – 2022-09-23 (×5): 30 mg
  Filled 2022-09-19 (×2): qty 1
  Filled 2022-09-19 (×2): qty 2
  Filled 2022-09-19: qty 1

## 2022-09-19 NOTE — Progress Notes (Signed)
Speech Language Pathology Treatment: Dysphagia  Patient Details Name: Derek Oconnell MRN: [id number] DOB: July 04, 1955 Today's Date: 09/19/2022 Time: 0865-7846 SLP Time Calculation (min) (ACUTE ONLY): 20 min  Assessment / Plan / Recommendation Clinical Impression  Pt seen for swallow re-assessment/po readiness trial; pt with increased somnolence this date, but orientation improved overall per nursing report/observation.  Pt demonstrated low vocal intensity during simple phrases and weak cough noted prior to po intake.  Pt given conservative amounts of single ice chips and 1/2 tsp of thin with delayed cough and audible swallow observed with both consistencies suggesting potential pharyngeal weakness.  Anterior loss of bolus noted with thin consistency via tsp.  Pt would benefit from continued NPO status and objective assessment when pt able to assess swallow function given recent hx/deconditioning.  Continue QID oral care via staff until objective assessment completed.    HPI HPI: 67 year old male presented to [facility] 9/3 with AMS and seizure-like activity. In ED, he had persistent AMS along with additional seizure-like activity. He was ultimately intubated and sedated. He was later transferred to [facility] for further evaluation management including LTM. ETT 9/3-5. CXR 9/3 clear. Head CT 9/3 with no acute findings. Pt with pmhx HTN, CHF, CAD s/p stenting and PPM, COPD, EtOH dependence.ST evaluation recommended NPO status on 09/18/22.  ST f/u for po readiness/MBS completion as pt able.      SLP Plan  Continue with current plan of care;MBS      Recommendations for follow up therapy are one component of a multi-disciplinary discharge planning process, led by the attending physician.  Recommendations may be updated based on patient status, additional functional criteria and insurance authorization.    Recommendations  Diet recommendations: NPO Medication Administration: Via alternative means                   Oral care QID;Oral care prior to ice chip/H20;Staff/trained caregiver to provide oral care     Dysphagia, unspecified (R13.10)     Continue with current plan of care;MBS     Pat Gurneet Matarese,M.S.,CCC-SLP  09/19/2022, 9:40 AM

## 2022-09-19 NOTE — Progress Notes (Addendum)
STROKE TEAM PROGRESS NOTE   BRIEF HPI Mr. Derek Oconnell is a 67 y.o. male with history of PVD, CAD s/p stenting 2 years ago and pacemaker placement in April, also with a history of heavy EtOH use, presenting to the ED via EMS this afternoon after recurrent spells of tonic flexor posturing of BUE with unresponsiveness. The posturing spells were preceded by a 3 day history of malaise, decreased PO intake and diarrhea. Last drink of Bourbon, per wife, was estimated to have been on Friday. He drinks about one 1.5 L bottle of Bourbon every 4-5 days and is a long-time chronic drinker who "nurses" his drinks from approximately the afternoon to late evening/night every day.   He was intubated and extubated. MRI brain revealed right parietal infarcts   SIGNIFICANT HOSPITAL EVENTS 9/3 admitted and intubated at outside hospital. Neurology following for seizures  9/5 extubated 9/6 MRI brain w acute cortical infarcts R parietal lobe   INTERIM HISTORY/SUBJECTIVE Patient is awake and sitting up in the bed. RN at the bedside. No family present on rounds.  Dr Pearlean Brownie called and spoke to patients wife and updated her on results and plan   Mri brain was obtained and revealed right parietal infarcts.   CTA ordered and pending.  Will check CMP, CBC and ammonia level today  A1c and lipid panel pending   Exam he is awake and alert, oriented, slow to respond, moves all 4 extremities spontaneous and antigravity, right side may slightly be weaker, but exam is with poor effort .   OBJECTIVE  CBC    Component Value Date/Time   WBC 4.3 09/18/2022 0750   RBC 2.54 (L) 09/18/2022 0750   HGB 8.8 (L) 09/18/2022 0750   HCT 27.1 (L) 09/18/2022 0750   PLT 99 (L) 09/18/2022 0750   MCV 106.7 (H) 09/18/2022 0750   MCH 34.6 (H) 09/18/2022 0750   MCHC 32.5 09/18/2022 0750   RDW 17.8 (H) 09/18/2022 0750    BMET    Component Value Date/Time   NA 137 09/18/2022 0750   K 3.9 09/18/2022 0750   CL 112 (H) 09/18/2022 0750    CO2 16 (L) 09/18/2022 0750   GLUCOSE 87 09/18/2022 0750   BUN 11 09/18/2022 0750   CREATININE 1.23 09/18/2022 0750   CALCIUM 6.8 (L) 09/18/2022 0750   GFRNONAA >60 09/18/2022 0750    IMAGING past 24 hours MR BRAIN WO CONTRAST  Result Date: 09/18/2022 CLINICAL DATA:  Seizure disorder, clinical change EXAM: MRI HEAD WITHOUT CONTRAST TECHNIQUE: Multiplanar, multiecho pulse sequences of the brain and surrounding structures were obtained without intravenous contrast. COMPARISON:  CT Head and C spine 09/15/22 FINDINGS: Motion degraded exam no definite evidence of hemorrhage when accounting for the degree of motion artifact. There is sequela of mild chronic microvascular ischemic change. Age advanced generalized volume loss. 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 Vascular: Normal flow voids. Skull and upper cervical spine: Normal marrow signal. Sinuses/Orbits: No middle ear or mastoid effusion. Mucosal thickening left maxillary sinus. Orbits are unremarkable. Other: None. IMPRESSION: Motion degraded exam. 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 No definite evidence of hemorrhage. These results will be called to the ordering clinician or representative by the Radiologist Assistant, and communication documented in the PACS or [employer]. Electronically Signed   By: Lorenza Cambridge M.D.   On: 09/18/2022 18:26    Vitals:   09/19/22 0615 09/19/22 0630 09/19/22 0645 09/19/22 0700  BP: (!) 87/60 95/68 (!) 88/61 Marland Kitchen)  87/65  Pulse: 95 95 95   Resp: 15 18 17 18   Temp:      TempSrc:      SpO2: 100% 99% 100%   Weight:      Height:         PHYSICAL EXAM General: Drowsy well-nourished, well-developed middle-age Caucasian male in no acute distress Psych:  Mood and affect appropriate for situation CV: Regular rate and rhythm on monitor Respiratory:  Regular, unlabored respirations on room air GI: Abdomen soft and nontender   NEURO:  Exam patient is drowsy but can be  aroused.  Is with poor effort and inconsistent  Mental Status: AA&Ox3, slow responses  Speech/Language: speech is dysarthric.   Cranial Nerves:  II: PERRL. Visual fields full.  III, IV, VI: EOMI. Eyelids elevate symmetrically.  V: Sensation is intact to light touch and symmetrical to face.  VII: Face is symmetrical resting and smiling VIII: hearing intact to voice. IX, X: Palate elevates symmetrically. Phonation is normal.  WU:[id number] shrug 5/5. XII: tongue is midline without fasciculations. Motor:  moves all 4 extremities spontaneous and antigravity, right side may slightly be weaker with slight drift  Tone: is normal and bulk is normal Sensation- Intact to light touch bilaterally. Extinction absent to light touch to DSS.   Coordination: FTN intact bilaterally, HKS: no ataxia in BLE.No drift.  Gait- deferred   ASSESSMENT/PLAN  Acute Ischemic Infarct:  right parietal  Etiology:  likely embolic   CTA head & neck ordered MRI  Acute cortical infarcts in the right parietal lobe  2D Echo 9/4: EF 60 to 65% ventricle with grade 2 diastolic dysfunction. 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 On no AEDs Seizure precautions  Hypertension Currently with hypotension Diastolic heart failure CAD PVD s/p stents and aortobifem bypass Home meds: Aspirin, Plavix, lisinopril 2.5 mg, metoprolol 25 mg, UnStable On Levophed drip Blood Pressure Goal: BP less than 220/110   Hyperlipidemia Home meds: Atorvastatin 80 mg, resumed in hospital LDL ordered., goal < 70   Continue statin at discharge  Diabetes type II Controlled Home meds: None HgbA1c ordered goal < 7.0 CBGs SSI Recommend close follow-up with PCP for better DM control  Tobacco  Abuse Patient smokes cigarettes daily Nicotine replacement therapy provided  EtOH abuse On CIWA protocol  Dysphagia Patient has post-stroke dysphagia, SLP consulted    Diet   Diet NPO time specified   Advance diet as tolerated  Other Stroke Risk Factors ETOH use,  advised to drink no more than 2 drink(s) a day Coronary artery disease Congestive heart failure  Other Active Problems Thrombocytopenia Malnutrition Hypokalemia-managed per primary team For phosphatemia managed per primary team  Hospital day # 4  Gevena Mart DNP, ACNPC-AG  [facility]   I have personally obtained history,examined this patient, reviewed notes, independently viewed imaging studies, participated in medical decision making and plan of care.ROS completed by me personally and pertinent positives fully documented  I have made any additions or clarifications directly to the above note. Agree with note above.  Patient presented with seizure-like activity and unresponsiveness likely from multifactorial encephalopathy but subsequently was found to have right parietal embolic infarct on MRI scan.  He is currently on ativan for alcohol withdrawal precaution and neurological exam is limited due to depressed mental status and poor  effort but I do not see any big focal deficit.  Stroke etiology is likely cardioembolic.  Patient may not be the best candidate for long-term anticoagulation given his heavy alcohol intake and fall risk.  Recommend 30-day heart monitor at discharge for paroxysmal A-fib.  Continue aspirin alone for now due to thrombocytopenia and bleeding risk for stroke prevention.  Check CT angiogram brain and neck, lipid profile and hemoglobin A1c.  Long discussion over the phone with the patient's wife regarding his clinical condition, MRI findings and evaluation and treatment plan and answered questions.  Discussed with Dr. Corliss Blacker critical care medicine.This patient is critically ill and at  significant risk of neurological worsening, death and care requires constant monitoring of vital signs, hemodynamics,respiratory and cardiac monitoring, extensive review of multiple databases, frequent neurological assessment, discussion with family, other specialists and medical decision making of high complexity.I have made any additions or clarifications directly to the above note.This critical care time does not reflect procedure time, or teaching time or supervisory time of PA/NP/Med Resident etc but could involve care discussion time.  I spent 30 minutes of neurocritical care time  in the care of  this patient.      Delia Heady, MD Medical Director [facility] Stroke [facility] Pager: [phone] 09/19/2022 2:13 PM    To contact Stroke Continuity provider, please refer to [email or web address]. After hours, contact General Neurology

## 2022-09-19 NOTE — Progress Notes (Signed)
NAME:  Derek Oconnell, MRN:  [id number], DOB:  December 18, 1955, LOS: 4 ADMISSION DATE:  09/15/2022, CONSULTATION DATE:  9/3 REFERRING MD:  [facility], CHIEF COMPLAINT:  Siezures   History of Present Illness:  67 year old male presented to [facility] 9/3 with AMS and seizure-like activity. In ED, he had persistent AMS along with additional seizure-like activity.  He was ultimately intubated and sedated.  He was later transferred to [facility] for further evaluation management including LTM.  Extubated yesterday. No seizure activity on EEG. Awake and alert, however remains confused. Oriented to self and place only.  Pertinent  Medical History  HTN, CHF, CAD s/p stenting and PPM, COPD, EtOH dependence, sacral pressure injury  Significant Hospital Events: Including procedures, antibiotic start and stop dates in addition to other pertinent events   9/3 admitted and intubated at outside hospital  9/5 extubated 9/6 MRI brain w acute cortical infarcts R parietal lobe   Interim History / Subjective:   MRI brain with Acute cortical infarcts R parietal lobe  Back on pressors   Objective   Blood pressure 99/64, pulse 95, temperature 98.1 F (36.7 C), temperature source Oral, resp. rate 17, height 5\' 10"  (1.778 m), weight 60.9 kg, SpO2 100%.        Intake/Output Summary (Last 24 hours) at 09/19/2022 1129 Last data filed at 09/19/2022 1100 Gross per 24 hour  Intake 2452.02 ml  Output 865 ml  Net 1587.02 ml   Filed Weights   09/16/22 0430 09/18/22 0545 09/19/22 0152  Weight: 55.5 kg 68.1 kg 60.9 kg    Examination: General: very debilitated and chronically ill appearing M  Neuro: Very lethargic, oriented x3. Globally weak HEENT: tacky mm. Anicteric sclera. Cortrak secure  Lungs: Transient RUL rhonchi. Incr RR.  Cardiovascular: rr cap refill <3sec GI: thin soft  GU: external cath  Extremities: decr muscle mass Skin: scattered ecchymosis   Resolved Hospital Problem list   Acute hypoxic respiratory failure     Assessment & Plan:   Acute encephalopathy EtOH use w withdrawal Concern for withdrawal seizures  Acute cortical infarcts, R parietal lobe  P -neuro following appreciate recs  -no AEDs, likely provoked sz by etoh withdrawal -cont CIWA  -ECHO already obtained this admission, send Lipid panel   Hypotension -does have degree of chronic hypotension, SBP 90s-100 outpt -has been on pressors since 9/3, briefly off 9/6. Felt likely related to sedation (was intubated 9/3-9/5), but is still ongoing-- low dose now  -afebrile, has not had elevated WBC this admission.  -gII diastolic HF on ECHO P -check a cbc, doubt sepsis based on other data so defer cx and abx unless new indication arises    -wean pressors, hopefully off 9/7  Diastolic HF  Hx HFrEF (now recovered), s/p BiV ICD (4/24) Hx CAD  Hx HLD   Hx PVD s/p stents, aortobifem bypass  -LVEF 2/24 was 25-30%, mildly reduced RV fxn. Biv ICD 4/24. This admission LVEF 60-65%, gII dd, poorly visualized RV systolic fxn  P -DAPT -statin  -follows w [facility]. Have not been able to do much by way of GDMT, limited by hypotension at times   Moderate protein calorie malnutrition  Hypokalemia  Hypomagnesemia Hypophosphatemia  P -replace  -high risk of refeeding -- BID BMP + mag phos  -EN -micronutrient support  -restart remeron 30mg  qHS  Anemia, chronic  Thrombocytopenia  -felt likely 2/2 etoh P -cont B12 folate Fe   Physical deconditioning -PT/OT -Pt is on board w idea of CIR. Not sure if CIR  vs SNF will be best destination but do not think dc home is likely to be successful  -will place [facility] consult   Esophageal dysphagia, pharyngoesophageal phase  -EN via cortrak  -SLP to see  Tobacco use  -he cannot tell me if he is still smoking or not -encouraged cessation  Best Practice (right click and "Reselect all SmartList Selections" daily)   Diet/type: tubefeeds DVT prophylaxis: prophylactic heparin  GI prophylaxis: PPI Lines:  N/A Foley:  N/A Code Status:  full code Last date of multidisciplinary goals of care discussion [9/6  Labs   CBC: Recent Labs  Lab 09/15/22 1338 09/16/22 0538 09/17/22 0558 09/18/22 0750  WBC 12.2* 7.7 5.8 4.3  HGB 10.8* 7.1* 8.1* 8.8*  HCT 33.2* 21.0* 24.9* 27.1*  MCV 102.8* 98.6 102.5* 106.7*  PLT 90* 78* 95* 99*    Basic Metabolic Panel: Recent Labs  Lab 09/16/22 0538 09/16/22 1156 09/16/22 1744 09/17/22 0558 09/17/22 1755 09/18/22 0750 09/19/22 0713  NA 137 137  --  138  --  137 140  K 3.7 4.7  --  4.2  --  3.9 3.3*  CL 108 113*  --  114*  --  112* 109  CO2 13* 14*  --  17*  --  16* 21*  GLUCOSE 112* 109*  --  116*  --  87 128*  BUN 13 12  --  12  --  11 19  CREATININE 1.45* 1.54*  --  1.42*  --  1.23 1.03  CALCIUM 5.2* 6.9*  --  6.9*  --  6.8* 7.0*  MG 2.5*  --  2.2 1.9 2.2 2.0 1.5*  PHOS 3.3  --  2.2* 2.3* 2.9 1.6* 2.1*   GFR: Estimated Creatinine Clearance: 60.8 mL/min (by C-G formula based on SCr of 1.03 mg/dL). Recent Labs  Lab 09/15/22 1338 09/15/22 1555 09/15/22 2129 09/16/22 0538 09/17/22 0558 09/18/22 0750  WBC 12.2*  --   --  7.7 5.8 4.3  LATICACIDVEN >9.0* 4.0* 1.0  --   --   --     Liver Function Tests: Recent Labs  Lab 09/15/22 1555  AST 23  ALT 8  ALKPHOS 89  BILITOT 0.7  PROT 4.5*  ALBUMIN 1.7*   Recent Labs  Lab 09/15/22 1555  LIPASE 38   No results for input(s): "AMMONIA" in the last 168 hours.  ABG    Component Value Date/Time   PHART 7.34 (L) 09/16/2022 0522   PCO2ART 23 (L) 09/16/2022 0522   PO2ART 199 (H) 09/16/2022 0522   HCO3 12.7 (L) 09/16/2022 0522   ACIDBASEDEF 11.9 (H) 09/16/2022 0522   O2SAT 100 09/16/2022 0522     Coagulation Profile: No results for input(s): "INR", "PROTIME" in the last 168 hours.  Cardiac Enzymes: No results for input(s): "CKTOTAL", "CKMB", "CKMBINDEX", "TROPONINI" in the last 168 hours.  HbA1C: No results found for: "HGBA1C"  CBG: Recent Labs  Lab 09/18/22 1521  09/18/22 1924 09/18/22 2336 09/19/22 0335 09/19/22 0817  GLUCAP 94 112* 131* 129* 124*   CRITICAL CARE Performed by: Lanier Clam   Total critical care time: 39 minutes  Critical care time was exclusive of separately billable procedures and treating other patients.  Critical care was necessary to treat or prevent imminent or life-threatening deterioration.  Critical care was time spent personally by me on the following activities: development of treatment plan with patient and/or surrogate as well as nursing, discussions with consultants, evaluation of patient's response to treatment, examination of patient, obtaining  history from patient or surrogate, ordering and performing treatments and interventions, ordering and review of laboratory studies, ordering and review of radiographic studies, pulse oximetry and re-evaluation of patient's condition.  Tessie Fass MSN, AGACNP-BC [facility] Pulmonary/Critical Care Medicine Amion for pager  09/19/2022, 11:29 AM

## 2022-09-19 NOTE — Progress Notes (Signed)
Verbal consent obtained to clip pubic hair for placement of a external urinary catheter (purewick).

## 2022-09-19 NOTE — Progress Notes (Signed)
Secure chat sent to RN to notify PICC will be placed 9/8.

## 2022-09-19 NOTE — Progress Notes (Signed)
OT Cancellation Note  Patient Details Name: Anthonie Schiesser MRN: [id number] DOB: May 09, 1955   Cancelled Treatment:    Reason Eval/Treat Not Completed: Other (comment) (per RN, OT to cancel for today and check back tomrrow.)  Donia Pounds 09/19/2022, 2:08 PM

## 2022-09-19 NOTE — Progress Notes (Signed)
Pharmacy Antibiotic Note  Derek Oconnell is a 67 y.o. male admitted on 09/15/2022 now with concern for aspiration pna.  Pharmacy has been consulted for Unasyn dosing.  Plan: Unasyn 3g IV every 6h Monitor renal function, clinical progression and LOT  Height: 5\' 10"  (177.8 cm) Weight: 60.9 kg (134 lb 4.2 oz) IBW/kg (Calculated) : 73  Temp (24hrs), Avg:98.5 F (36.9 C), Min:97.8 F (36.6 C), Max:99.2 F (37.3 C)  Recent Labs  Lab 09/15/22 1338 09/15/22 1555 09/15/22 2129 09/16/22 0538 09/16/22 1156 09/17/22 0558 09/18/22 0750 09/19/22 0713 09/19/22 1351 09/19/22 1613  WBC 12.2*  --   --  7.7  --  5.8 4.3  --  5.7  --   CREATININE 2.16*  --  1.81* 1.45* 1.54* 1.42* 1.23 1.03 1.07  --   LATICACIDVEN >9.0* 4.0* 1.0  --   --   --   --   --  2.4* 3.1*    Estimated Creatinine Clearance: 58.5 mL/min (by C-G formula based on SCr of 1.07 mg/dL).    No Known Allergies  Daylene Posey, PharmD, [facility] Clinical Pharmacist ED Pharmacist Phone # [phone] 09/19/2022 6:38 PM

## 2022-09-19 NOTE — Plan of Care (Signed)

## 2022-09-19 NOTE — Progress Notes (Addendum)
Incr LA this afternoon  - slightly incr NE  -low grade temp 37.2 -PCT 0.32 WBC 5.7 -O2 req stable but CTA head neck with RUL GGOs.In context of known chronic dysphagia, c/f aspiration     Concern for septic shock superimposed on chronic hypotension Aspiration PNA  P -will start unasyn -AM CXR -sputum cx if he can produce -send Bcx, UA   -f/u La is ordered -will order PICC, not emergent -- on 4 NE right now which can go peripherally   -coox after picc    Tessie Fass MSN, AGACNP-BC [facility] Pulmonary/Critical Care Medicine 09/19/2022, 6:03 PM

## 2022-09-19 NOTE — Plan of Care (Signed)
  Problem: Elimination: Goal: Will not experience complications related to bowel motility Outcome: Progressing Goal: Will not experience complications related to urinary retention Outcome: Progressing   Problem: Pain Managment: Goal: General experience of comfort will improve Outcome: Not Progressing   Problem: Safety: Goal: Ability to remain free from injury will improve Outcome: Progressing   Problem: Activity: Goal: Ability to tolerate increased activity will improve Outcome: Not Progressing   Problem: Role Relationship: Goal: Method of communication will improve Outcome: Progressing

## 2022-09-20 ENCOUNTER — Inpatient Hospital Stay (HOSPITAL_COMMUNITY): Payer: Medicare Other

## 2022-09-20 DIAGNOSIS — G9340 Encephalopathy, unspecified: Secondary | ICD-10-CM | POA: Diagnosis not present

## 2022-09-20 DIAGNOSIS — G40901 Epilepsy, unspecified, not intractable, with status epilepticus: Secondary | ICD-10-CM | POA: Diagnosis not present

## 2022-09-20 DIAGNOSIS — A419 Sepsis, unspecified organism: Secondary | ICD-10-CM

## 2022-09-20 DIAGNOSIS — J690 Pneumonitis due to inhalation of food and vomit: Secondary | ICD-10-CM

## 2022-09-20 LAB — BASIC METABOLIC PANEL
Anion gap: 8 (ref 5–15)
Anion gap: 10 (ref 5–15)
BUN: 31 mg/dL — ABNORMAL HIGH (ref 8–23)
BUN: 41 mg/dL — ABNORMAL HIGH (ref 8–23)
CO2: 23 mmol/L (ref 22–32)
CO2: 23 mmol/L (ref 22–32)
Calcium: 7.4 mg/dL — ABNORMAL LOW (ref 8.9–10.3)
Calcium: 7.4 mg/dL — ABNORMAL LOW (ref 8.9–10.3)
Chloride: 108 mmol/L (ref 98–111)
Chloride: 107 mmol/L (ref 98–111)
Creatinine, Ser: 1.15 mg/dL (ref 0.61–1.24)
Creatinine, Ser: 1.13 mg/dL (ref 0.61–1.24)
GFR, Estimated: >60 mL/min (ref >60)
GFR, Estimated: >60 mL/min (ref >60)
Glucose, Bld: 120 mg/dL — ABNORMAL HIGH (ref 70–99)
Glucose, Bld: 126 mg/dL — ABNORMAL HIGH (ref 70–99)
Potassium: 4.2 mmol/L (ref 3.5–5.1)
Potassium: 4.8 mmol/L (ref 3.5–5.1)
Sodium: 139 mmol/L (ref 135–145)
Sodium: 140 mmol/L (ref 135–145)

## 2022-09-20 LAB — COOXEMETRY PANEL
Carboxyhemoglobin: 1.5 % (ref 0.5–1.5)
Methemoglobin: <0.7 % (ref 0.0–1.5)
O2 Saturation: 76.1 %
Total hemoglobin: 7.8 g/dL — ABNORMAL LOW (ref 12.0–16.0)

## 2022-09-20 LAB — GLUCOSE, CAPILLARY
Glucose-Capillary: 140 mg/dL — ABNORMAL HIGH (ref 70–99)
Glucose-Capillary: 108 mg/dL — ABNORMAL HIGH (ref 70–99)
Glucose-Capillary: 93 mg/dL (ref 70–99)
Glucose-Capillary: 124 mg/dL — ABNORMAL HIGH (ref 70–99)
Glucose-Capillary: 108 mg/dL — ABNORMAL HIGH (ref 70–99)
Glucose-Capillary: 144 mg/dL — ABNORMAL HIGH (ref 70–99)

## 2022-09-20 LAB — LIPID PANEL
Cholesterol: 111 mg/dL (ref 0–200)
HDL: 39 mg/dL — ABNORMAL LOW (ref >40)
LDL Cholesterol: 47 mg/dL (ref 0–99)
Total CHOL/HDL Ratio: 2.8 ratio
Triglycerides: 125 mg/dL (ref <150)
VLDL: 25 mg/dL (ref 0–40)

## 2022-09-20 LAB — PHOSPHORUS
Phosphorus: 1.9 mg/dL — ABNORMAL LOW (ref 2.5–4.6)
Phosphorus: 4.8 mg/dL — ABNORMAL HIGH (ref 2.5–4.6)

## 2022-09-20 LAB — MAGNESIUM
Magnesium: 1.8 mg/dL (ref 1.7–2.4)
Magnesium: 2.1 mg/dL (ref 1.7–2.4)

## 2022-09-20 MED ORDER — GUAIFENESIN 100 MG/5ML PO LIQD
15.0000 mL | Freq: Four times a day (QID) | ORAL | Status: DC | PRN
  Administered 2022-09-20 – 2022-09-24 (×2): 15 mL
  Filled 2022-09-20 (×2): qty 15

## 2022-09-20 MED ORDER — SODIUM CHLORIDE 0.9% FLUSH: 10.0000 mL | INTRAVENOUS | Status: DC | PRN

## 2022-09-20 MED ORDER — MAGNESIUM SULFATE 2 GM/50ML IV SOLN
2.0000 g | Freq: Once | INTRAVENOUS | Status: AC
  Administered 2022-09-20: 2 g via INTRAVENOUS
  Filled 2022-09-20: qty 50

## 2022-09-20 MED ORDER — SODIUM CHLORIDE 0.9% FLUSH
10.0000 mL | Freq: Two times a day (BID) | INTRAVENOUS | Status: DC
  Administered 2022-09-20: 20 mL
  Administered 2022-09-20 – 2022-09-24 (×5): 10 mL

## 2022-09-20 MED ORDER — SODIUM PHOSPHATES 45 MMOLE/15ML IV SOLN
45.0000 mmol | Freq: Once | INTRAVENOUS | Status: AC
  Administered 2022-09-20: 45 mmol via INTRAVENOUS
  Filled 2022-09-20: qty 15

## 2022-09-20 NOTE — Progress Notes (Signed)
NAME:  Derek Oconnell, MRN:  [id number], DOB:  [birth year], LOS: 5 ADMISSION DATE:  09/15/2022, CONSULTATION DATE:  9/3 REFERRING MD:  [facility], CHIEF COMPLAINT:  Siezures   History of Present Illness:  67 year old male presented to [facility] 9/3 with AMS and seizure-like activity. In ED, he had persistent AMS along with additional seizure-like activity.  He was ultimately intubated and sedated.  He was later transferred to [facility] for further evaluation management including LTM.  Extubated yesterday. No seizure activity on EEG. Awake and alert, however remains confused. Oriented to self and place only.  Pertinent  Medical History  HTN, CHF, CAD s/p stenting and PPM, COPD, EtOH dependence, sacral pressure injury  Significant Hospital Events: Including procedures, antibiotic start and stop dates in addition to other pertinent events   9/3 admitted and intubated at outside hospital  9/5 extubated 9/6 MRI brain w acute cortical infarcts R parietal lobe 9/7. Incr NE, low grade temp, + incr LA + GGO RUL on CTA h/n --> + unasyn  9/8 plan for PICC    Interim History / Subjective:   Pressors decr then incr overnight, now at 8 NE  Labs again not back this morning   Objective   Blood pressure 109/67, pulse (!) 108, temperature 98 F (36.7 C), temperature source Oral, resp. rate (!) 22, height 5\' 10"  (1.778 m), weight 61.3 kg, SpO2 96%.        Intake/Output Summary (Last 24 hours) at 09/20/2022 1017 Last data filed at 09/20/2022 0900 Gross per 24 hour  Intake 2593.53 ml  Output 1400 ml  Net 1193.53 ml   Filed Weights   09/18/22 0545 09/19/22 0152 09/20/22 0358  Weight: 68.1 kg 60.9 kg 61.3 kg    Examination:   General: chronically and acutely ill M NAD  Neuro: lethargic, following commands HEENT: NCAT. Secretions posterior pharynx. Cortrak Lungs: Upper lobe rhonchi  Cardiovascular: tachy GI: soft thin  GU: defer  Extremities: no acute joint deformity  Skin: scattered ecchymosis   Resolved  Hospital Problem list      Assessment & Plan:   Acute encephalopathy, improving EtOH abuse w withdrawal Concern for etoh withdrawal sz Acute cortical infarcts, R parietal love  P -neuro following -CIWA, micronutrient support, no AED indicated -stroke workup underway, likely cardioembolic  -ASA   Acute respiratory failure with hypoxia Suspected aspiration PNA  -started unasyn 9/7 PM w incr LA low grade temp, GGOs on CTA head/neck w known dysphagia. PCT was pretty benign and no leukocytosis.  P -CXR pending, then PRN -f/u sputum cx -- so far rare GPCs (MRSA PCR neg)  -cont unasyn -wean O2, goal 88-92  Chronic hypotension + possible septic shock  -does have degree of chronic hypotension, SBP 90s-100 outpt -gII diastolic HF on ECHO -incr NE req 9/7-9/8 w concomitant incr LA, + low grade temp and GGOs  -UA benign. Bcx sent  -considered DVT/PE as well (low grade temp incr O2, incr LA) but has been on dvt ppx + is thrombocytopenic + has GGOs to explain resp changes. No physical exam findings c/w DVT.  P -unasyn as above -f/u cx data -PICC 9/8 -follow WBC fever curve, send repeat PCT for 9/9  Diastolic HF Hx HFrEF (now recovered) s/p BiV ICD (04/2022) Hx CAD Hx HLD Hx PVD s/p stents, aortobifem bypass  -LVEF 2/24 was 25-30%, mildly reduced RV fxn. Biv ICD 4/24. This admission LVEF 60-65%, gII dd, poorly visualized RV systolic fxn  P -ASA alone for now per cva service --  was DAPT at home  -cont statin  -follows w cards at [facility]. Have not been able to do much by way of GDMT, limited by chronic hypotension   Moderate protein calorie malnutrition Refeeding syndrome  Hypokalemia Hypomagnesemia Hypophosphatemia  P -Awaiting BMP mag phos  -replace as needed -EN -remeron restarted 9/7  Anemia Thrombocytopenia  -chronic, 2/2 etoh  P -cont B12 folate Fe   Physical deconditioning -PT/OT -ultimately probably needs CIR vs SNF when ready  -TOC consulted   Chronic  esophageal dysphagia, pharyngoesophageal phase  -EN via cortrak   Tobacco use  -encouraged cessation  Best Practice (right click and "Reselect all SmartList Selections" daily)   Diet/type: tubefeeds DVT prophylaxis: prophylactic heparin  GI prophylaxis: PPI Lines: N/A Foley:  N/A Code Status:  full code Last date of multidisciplinary goals of care discussion [9/6  Labs   CBC: Recent Labs  Lab 09/15/22 1338 09/16/22 0538 09/17/22 0558 09/18/22 0750 09/19/22 1351  WBC 12.2* 7.7 5.8 4.3 5.7  HGB 10.8* 7.1* 8.1* 8.8* 9.4*  HCT 33.2* 21.0* 24.9* 27.1* 28.9*  MCV 102.8* 98.6 102.5* 106.7* 105.1*  PLT 90* 78* 95* 99* 110*    Basic Metabolic Panel: Recent Labs  Lab 09/17/22 0558 09/17/22 1755 09/18/22 0750 09/19/22 0713 09/19/22 1351 09/20/22 0841  NA 138  --  137 140 140 139  K 4.2  --  3.9 3.3* 3.8 4.2  CL 114*  --  112* 109 107 108  CO2 17*  --  16* 21* 20* 23  GLUCOSE 116*  --  87 128* 94 120*  BUN 12  --  11 19 21  31*  CREATININE 1.42*  --  1.23 1.03 1.07 1.15  CALCIUM 6.9*  --  6.8* 7.0* 7.5* 7.4*  MG 1.9 2.2 2.0 1.5*  --  1.8  PHOS 2.3* 2.9 1.6* 2.1*  --  1.9*   GFR: Estimated Creatinine Clearance: 54.8 mL/min (by C-G formula based on SCr of 1.15 mg/dL). Recent Labs  Lab 09/15/22 2129 09/16/22 0538 09/17/22 0558 09/18/22 0750 09/19/22 1351 09/19/22 1613 09/19/22 1913  PROCALCITON  --   --   --   --  0.32  --   --   WBC  --  7.7 5.8 4.3 5.7  --   --   LATICACIDVEN 1.0  --   --   --  2.4* 3.1* 2.3*    Liver Function Tests: Recent Labs  Lab 09/15/22 1555 09/19/22 1351  AST 23 19  ALT 8 11  ALKPHOS 89 79  BILITOT 0.7 0.7  PROT 4.5* 5.5*  ALBUMIN 1.7* 2.0*   Recent Labs  Lab 09/15/22 1555  LIPASE 38   Recent Labs  Lab 09/19/22 1351  AMMONIA 35    ABG    Component Value Date/Time   PHART 7.34 (L) 09/16/2022 0522   PCO2ART 23 (L) 09/16/2022 0522   PO2ART 199 (H) 09/16/2022 0522   HCO3 12.7 (L) 09/16/2022 0522   ACIDBASEDEF 11.9  (H) 09/16/2022 0522   O2SAT 100 09/16/2022 0522     Coagulation Profile: No results for input(s): "INR", "PROTIME" in the last 168 hours.  Cardiac Enzymes: No results for input(s): "CKTOTAL", "CKMB", "CKMBINDEX", "TROPONINI" in the last 168 hours.  HbA1C: Hgb A1c MFr Bld  Date/Time Value Ref Range Status  09/19/2022 01:51 PM 5.1 4.8 - 5.6 % Final    Comment:    (NOTE) Pre diabetes:          5.7%-6.4%  Diabetes:              >  6.4%  Glycemic control for   <7.0% adults with diabetes     CBG: Recent Labs  Lab 09/19/22 1601 09/19/22 1936 09/19/22 2348 09/20/22 0355 09/20/22 0747  GLUCAP 102* 108* 115* 140* 108*   CRITICAL CARE Performed by: Lanier Clam   Total critical care time: 40 minutes  Critical care time was exclusive of separately billable procedures and treating other patients.  Critical care was necessary to treat or prevent imminent or life-threatening deterioration.  Critical care was time spent personally by me on the following activities: development of treatment plan with patient and/or surrogate as well as nursing, discussions with consultants, evaluation of patient's response to treatment, examination of patient, obtaining history from patient or surrogate, ordering and performing treatments and interventions, ordering and review of laboratory studies, ordering and review of radiographic studies, pulse oximetry and re-evaluation of patient's condition.  Tessie Fass MSN, AGACNP-BC [facility] Amion for pager  09/20/2022, 10:17 AM

## 2022-09-20 NOTE — Progress Notes (Signed)
Peripherally Inserted Central Catheter Placement  The IV Nurse has discussed with the patient and/or persons authorized to consent for the patient, the purpose of this procedure and the potential benefits and risks involved with this procedure.  The benefits include less needle sticks, lab draws from the catheter, and the patient may be discharged home with the catheter. Risks include, but not limited to, infection, bleeding, blood clot (thrombus formation), and puncture of an artery; nerve damage and irregular heartbeat and possibility to perform a PICC exchange if needed/ordered by physician.  Alternatives to this procedure were also discussed.  Bard Power PICC patient education guide, fact sheet on infection prevention and patient information card has been provided to patient /or left at bedside.  Telephone consent obtained from his wife.  PICC Placement Documentation  PICC Double Lumen 09/20/22 Right Brachial 39 cm 0 cm (Active)  Indication for Insertion or Continuance of Line Vasoactive infusions;Limited venous access - need for IV therapy >5 days (PICC only) 09/20/22 0947  Exposed Catheter (cm) 0 cm 09/20/22 0947  Site Assessment Clean, Dry, Intact 09/20/22 0947  Lumen #1 Status Flushed;Saline locked;Blood return noted 09/20/22 0947  Lumen #2 Status Flushed;Saline locked;Blood return noted 09/20/22 0947  Dressing Type Transparent;Securing device 09/20/22 0947  Dressing Status Antimicrobial disc in place;Clean, Dry, Intact 09/20/22 0947  Line Care Connections checked and tightened 09/20/22 0947  Line Adjustment (NICU/IV Team Only) No 09/20/22 0947  Dressing Intervention New dressing 09/20/22 0947  Dressing Change Due 09/27/22 09/20/22 0947       Elliot Dally 09/20/2022, 9:47 AM

## 2022-09-20 NOTE — Progress Notes (Addendum)
STROKE TEAM PROGRESS NOTE   BRIEF HPI Mr. Derek Oconnell is a 66 y.o. male with history of PVD, CAD s/p stenting 2 years ago and pacemaker placement in April, also with a history of heavy EtOH use, presenting to the ED via EMS this afternoon after recurrent spells of tonic flexor posturing of BUE with unresponsiveness. The posturing spells were preceded by a 3 day history of malaise, decreased PO intake and diarrhea. Last drink of Bourbon, per wife, was estimated to have been on Friday. He drinks about one 1.5 L bottle of Bourbon every 4-5 days and is a long-time chronic drinker who "nurses" his drinks from approximately the afternoon to late evening/night every day.   He was intubated and extubated. MRI brain revealed right parietal infarcts   SIGNIFICANT HOSPITAL EVENTS 9/3 admitted and intubated at outside hospital. Neurology following for seizures  9/5 extubated 9/6 MRI brain w acute cortical infarcts R parietal lobe   INTERIM HISTORY/SUBJECTIVE Patient is awake and sitting up in the bed. RN at the bedside. Wife is at the bedside.  CTA with no LVO .  LDL 47 A1c 5.1 Will ask cardiology to interrogate ICD to evaluate for A fib    OBJECTIVE  CBC    Component Value Date/Time   WBC 5.7 09/19/2022 1351   RBC 2.75 (L) 09/19/2022 1351   HGB 9.4 (L) 09/19/2022 1351   HCT 28.9 (L) 09/19/2022 1351   PLT 110 (L) 09/19/2022 1351   MCV 105.1 (H) 09/19/2022 1351   MCH 34.2 (H) 09/19/2022 1351   MCHC 32.5 09/19/2022 1351   RDW 18.4 (H) 09/19/2022 1351    BMET    Component Value Date/Time   NA 139 09/20/2022 0841   K 4.2 09/20/2022 0841   CL 108 09/20/2022 0841   CO2 23 09/20/2022 0841   GLUCOSE 120 (H) 09/20/2022 0841   BUN 31 (H) 09/20/2022 0841   CREATININE 1.15 09/20/2022 0841   CALCIUM 7.4 (L) 09/20/2022 0841   GFRNONAA >60 09/20/2022 0841    IMAGING past 24 hours DG CHEST PORT 1 VIEW  Result Date: 09/20/2022 CLINICAL DATA:  Aspiration pneumonia. EXAM: PORTABLE CHEST 1 VIEW  COMPARISON:  September 16, 2022. FINDINGS: Stable cardiomediastinal silhouette. Feeding tube is seen entering stomach. Left-sided defibrillator is unchanged. Increased bibasilar atelectasis or infiltrates are noted with possible minimal left pleural effusion. Bony thorax is unremarkable. IMPRESSION: Increased bibasilar atelectasis or infiltrates are noted with possible minimal left pleural effusion. Electronically Signed   By: Lupita Raider M.D.   On: 09/20/2022 09:49   [place] EKG SITE RITE  Result Date: 09/19/2022 If Site Rite image not attached, placement could not be confirmed due to current cardiac rhythm.  CT ANGIO HEAD NECK W WO CM  Result Date: 09/19/2022 CLINICAL DATA:  Stroke/TIA, determine embolic source EXAM: CT ANGIOGRAPHY HEAD AND NECK WITH AND WITHOUT CONTRAST TECHNIQUE: Multidetector CT imaging of the head and neck was performed using the standard protocol during bolus administration of intravenous contrast. Multiplanar CT image reconstructions and MIPs were obtained to evaluate the vascular anatomy. Carotid stenosis measurements (when applicable) are obtained utilizing NASCET criteria, using the distal internal carotid diameter as the denominator. RADIATION DOSE REDUCTION: This exam was performed according to the departmental dose-optimization program which includes automated exposure control, adjustment of the mA and/or kV according to patient size and/or use of iterative reconstruction technique. CONTRAST:  75mL OMNIPAQUE IOHEXOL 350 MG/ML SOLN COMPARISON:  Brain MR 09/18/2022 FINDINGS: CT HEAD FINDINGS Brain: No evidence of  acute infarction, hemorrhage, hydrocephalus, extra-axial collection or mass lesion/mass effect. Previously seen infarcts in the right parietal lobe are not visualized on this exam due to CT technique. Vascular: See below Skull: Normal. Negative for fracture or focal lesion. Sinuses/Orbits: No middle ear or mastoid effusion. Mucosal thickening left maxillary sinus with  osseous changes suggestive of chronic left maxillary sinusitis. Orbits are unremarkable. Other: None. Review of the MIP images confirms the above findings CTA NECK FINDINGS Aortic arch: Standard branching. Imaged portion shows no evidence of aneurysm or dissection. No significant stenosis of the major arch vessel origins. Right carotid system: No evidence of dissection, stenosis (50% or greater), or occlusion. Mild narrowing of the origin of the ICA secondary to calcified atherosclerotic plaque. Left carotid system: No evidence of dissection, stenosis (50% or greater), or occlusion. Mild narrowing of the origin of the ICA secondary to calcified atherosclerotic plaque. Vertebral arteries: Codominant. No evidence of dissection, stenosis (50% or greater), or occlusion. Skeleton: Degenerative endplate changes of the inferior and superior endplates of C6 on C7. Patient is edentulous. Other neck: Negative. Upper chest: Small bilateral pleural effusions, right-greater-than-left. An area of ground-glass opacity in the right upper lobe, which is suspicious for an underlying infectious or inflammatory process. Partially imaged enteric tube in place. Left-sided cardiac device in place. No pneumothorax. Review of the MIP images confirms the above findings CTA HEAD FINDINGS Anterior circulation: No significant stenosis, proximal occlusion, aneurysm, or vascular malformation. Mild narrowing of the cavernous segments of bilateral ICAs. Posterior circulation: No significant stenosis, proximal occlusion, aneurysm, or vascular malformation. Venous sinuses: As permitted by contrast timing, patent. Anatomic variants: None Review of the MIP images confirms the above findings IMPRESSION: 1. No acute intracranial abnormality. Known infarcts in the right parietal lobe are not visualized on this exam due to CT technique. 2. No intracranial large vessel occlusion or significant stenosis. 3. No hemodynamically significant stenosis in the  neck. 4. Small bilateral pleural effusions, right-greater-than-left. An area of ground-glass opacity in the right upper lobe is suspicious for an underlying infectious or inflammatory process. Electronically Signed   By: Lorenza Cambridge M.D.   On: 09/19/2022 17:30    Vitals:   09/20/22 1000 09/20/22 1100 09/20/22 1130 09/20/22 1200  BP: 117/67 121/75  112/65  Pulse: (!) 104 (!) 103  (!) 107  Resp: (!) 21 (!) 21  (!) 23  Temp:   99.2 F (37.3 C)   TempSrc:   Axillary   SpO2: 97% 96%  96%  Weight:      Height:         PHYSICAL EXAM General: critically ill middle-age Caucasian male in no acute distress Psych:  Mood and affect appropriate for situation CV: Regular rate and rhythm on monitor Respiratory:  Regular, unlabored respirations on room air GI: Abdomen soft and nontender   NEURO:  Exam patient is awake and alert Mental Status: AA&Ox3, slow responses  Speech/Language: speech is dysarthric.   Cranial Nerves:  II: PERRL. Visual fields full.  III, IV, VI: EOMI. Eyelids elevate symmetrically.  V: Sensation is intact to light touch and symmetrical to face.  VII: Face is symmetrical resting and smiling VIII: hearing intact to voice. IX, X: Palate elevates symmetrically. Phonation is normal.  [id number] shrug 5/5. XII: tongue is midline without fasciculations. Motor:  moves all 4 extremities spontaneous and antigravity,  Tone: is normal and bulk is normal Sensation- Intact to light touch bilaterally. Extinction absent to light touch to DSS.   Coordination: FTN intact bilaterally,  Gait- deferred   ASSESSMENT/PLAN  Acute Ischemic Infarct:  right parietal  Etiology:  likely embolic   CT head no acute process  CTA head & neck No LVO MRI  Acute cortical infarcts in the right parietal lobe  2D Echo 9/4: EF 60 to 65% ventricle with grade 2 diastolic dysfunction. LDL 47 HgbA1c 5.1 VTE prophylaxis -SCDs aspirin 81 mg daily and clopidogrel 75 mg daily prior to admission, now  on aspirin 81 mg daily alone due to thrombocytopenia Therapy recommendations: Pending Disposition: Pending  Seizures-provoked in the setting of alcohol withdrawal On no AEDs Seizure precautions  Hypertension Currently with hypotension Diastolic heart failure CAD PVD s/p stents and aortobifem bypass Home meds: Aspirin, Plavix, lisinopril 2.5 mg, metoprolol 25 mg, UnStable On Levophed drip Blood Pressure Goal: BP less than 220/110   Hyperlipidemia Home meds: Atorvastatin 80 mg, resumed in hospital LDL ordered., goal < 70   Continue statin at discharge  Diabetes type II Controlled Home meds: None HgbA1c 5.1 goal < 7.0 CBGs SSI Recommend close follow-up with PCP for better DM control  Tobacco Abuse Patient smokes cigarettes daily Nicotine replacement therapy provided  EtOH abuse On CIWA protocol  Dysphagia Patient has post-stroke dysphagia, SLP consulted    Diet   Diet NPO time specified   Advance diet as tolerated  Other Stroke Risk Factors ETOH use,  advised to drink no more than 2 drink(s) a day Coronary artery disease Congestive heart failure  Other Active Problems Thrombocytopenia Malnutrition Hypokalemia-managed per primary team For phosphatemia managed per primary team  Hospital day # 5  Gevena Mart DNP, ACNPC-AG  [facility]  I have personally obtained history,examined this patient, reviewed notes, independently viewed imaging studies, participated in medical decision making and plan of care.ROS completed by me personally and pertinent positives fully documented  I have made any additions or clarifications directly to the above note. Agree with note above.  Patient is on less sedation today and more arousable.  Does not seem to have significant focal deficits.  Recommend speech therapy for swallow eval.  Continue alcohol withdrawal precaution as per critical care team.  Mobilize out of bed.  Therapy consults.  Cardiology to interrogate  ICD to evaluate for A-fib.  Continue aspirin alone for stroke prevention (cytopenia and fall risk.  Long discussion with patient and wife at the bedside and answered questions.  Greater than 50% time during this 50-minute visit was spent in counseling and coordination of care about his embolic stroke and discussion about evaluation and treatment and prevention of stroke and answered questions.  Delia Heady, MD Medical Director [facility] Pager: [phone] 09/20/2022 2:37 PM     To contact Stroke Continuity provider, please refer to [email or web address]. After hours, contact General Neurology

## 2022-09-20 NOTE — Progress Notes (Addendum)
Pharmacy Electrolyte Replacement  Recent Labs:  Recent Labs    09/20/22 0841  K 4.2  MG 1.8  PHOS 1.9*  CREATININE 1.15    Low Critical Values (K </= 2.5, Phos </= 1, Mg </= 1) Present: None  MD Contacted: Tessie Fass, NP and Dr. Delton Coombes  Plan: NaPhos 45 mmol (slightly above protocol due to refeeding per discussion with Tessie Fass, NP and Dr. Delton Coombes) and Mag 2g. Repeat this PM.   Link Snuffer, PharmD, BCPS, BCCCP Please refer to [facility] for [facility] numbers 09/20/2022, 10:45 AM

## 2022-09-20 NOTE — Plan of Care (Signed)
  Problem: Education: Goal: Knowledge of General Education information will improve Description: Including pain rating scale, medication(s)/side effects and non-pharmacologic comfort measures Outcome: Progressing   Problem: Health Behavior/Discharge Planning: Goal: Ability to manage health-related needs will improve Outcome: Progressing   Problem: Clinical Measurements: Goal: Ability to maintain clinical measurements within normal limits will improve Outcome: Progressing Goal: Will remain free from infection Outcome: Progressing Goal: Diagnostic test results will improve Outcome: Progressing Goal: Respiratory complications will improve Outcome: Progressing Goal: Cardiovascular complication will be avoided Outcome: Progressing   Problem: Activity: Goal: Risk for activity intolerance will decrease Outcome: Progressing   Problem: Nutrition: Goal: Adequate nutrition will be maintained Outcome: Progressing   Problem: Coping: Goal: Level of anxiety will decrease Outcome: Progressing   Problem: Elimination: Goal: Will not experience complications related to bowel motility Outcome: Progressing Goal: Will not experience complications related to urinary retention Outcome: Progressing   Problem: Pain Managment: Goal: General experience of comfort will improve Outcome: Progressing   Problem: Safety: Goal: Ability to remain free from injury will improve Outcome: Progressing   Problem: Skin Integrity: Goal: Risk for impaired skin integrity will decrease Outcome: Progressing   Problem: Activity: Goal: Ability to tolerate increased activity will improve Outcome: Progressing   Problem: Respiratory: Goal: Ability to maintain a clear airway and adequate ventilation will improve Outcome: Progressing   Problem: Role Relationship: Goal: Method of communication will improve Outcome: Progressing   Problem: Education: Goal: Knowledge of disease or condition will  improve Outcome: Progressing Goal: Knowledge of secondary prevention will improve (MUST DOCUMENT ALL) Outcome: Progressing Goal: Knowledge of patient specific risk factors will improve Loraine Leriche N/A or DELETE if not current risk factor) Outcome: Progressing   Problem: Ischemic Stroke/TIA Tissue Perfusion: Goal: Complications of ischemic stroke/TIA will be minimized Outcome: Progressing   Problem: Coping: Goal: Will verbalize positive feelings about self Outcome: Progressing Goal: Will identify appropriate support needs Outcome: Progressing   Problem: Health Behavior/Discharge Planning: Goal: Ability to manage health-related needs will improve Outcome: Progressing Goal: Goals will be collaboratively established with patient/family Outcome: Progressing   Problem: Self-Care: Goal: Ability to participate in self-care as condition permits will improve Outcome: Progressing Goal: Verbalization of feelings and concerns over difficulty with self-care will improve Outcome: Progressing Goal: Ability to communicate needs accurately will improve Outcome: Progressing   Problem: Nutrition: Goal: Risk of aspiration will decrease Outcome: Progressing Goal: Dietary intake will improve Outcome: Progressing

## 2022-09-21 DIAGNOSIS — G9340 Encephalopathy, unspecified: Secondary | ICD-10-CM | POA: Diagnosis not present

## 2022-09-21 DIAGNOSIS — G40901 Epilepsy, unspecified, not intractable, with status epilepticus: Secondary | ICD-10-CM | POA: Diagnosis not present

## 2022-09-21 LAB — BASIC METABOLIC PANEL
Anion gap: 11 (ref 5–15)
Anion gap: 12 (ref 5–15)
BUN: 41 mg/dL — ABNORMAL HIGH (ref 8–23)
BUN: 50 mg/dL — ABNORMAL HIGH (ref 8–23)
CO2: 21 mmol/L — ABNORMAL LOW (ref 22–32)
CO2: 23 mmol/L (ref 22–32)
Calcium: 7.3 mg/dL — ABNORMAL LOW (ref 8.9–10.3)
Calcium: 8.2 mg/dL — ABNORMAL LOW (ref 8.9–10.3)
Chloride: 108 mmol/L (ref 98–111)
Chloride: 106 mmol/L (ref 98–111)
Creatinine, Ser: 1.22 mg/dL (ref 0.61–1.24)
Creatinine, Ser: 1.3 mg/dL — ABNORMAL HIGH (ref 0.61–1.24)
GFR, Estimated: >60 mL/min (ref >60)
GFR, Estimated: >60 mL/min (ref >60)
Glucose, Bld: 133 mg/dL — ABNORMAL HIGH (ref 70–99)
Glucose, Bld: 138 mg/dL — ABNORMAL HIGH (ref 70–99)
Potassium: 4.3 mmol/L (ref 3.5–5.1)
Potassium: 4.9 mmol/L (ref 3.5–5.1)
Sodium: 140 mmol/L (ref 135–145)
Sodium: 141 mmol/L (ref 135–145)

## 2022-09-21 LAB — GLUCOSE, CAPILLARY
Glucose-Capillary: 118 mg/dL — ABNORMAL HIGH (ref 70–99)
Glucose-Capillary: 116 mg/dL — ABNORMAL HIGH (ref 70–99)
Glucose-Capillary: 111 mg/dL — ABNORMAL HIGH (ref 70–99)
Glucose-Capillary: 126 mg/dL — ABNORMAL HIGH (ref 70–99)
Glucose-Capillary: 121 mg/dL — ABNORMAL HIGH (ref 70–99)
Glucose-Capillary: 122 mg/dL — ABNORMAL HIGH (ref 70–99)

## 2022-09-21 LAB — PHOSPHORUS
Phosphorus: 3.6 mg/dL (ref 2.5–4.6)
Phosphorus: 2 mg/dL — ABNORMAL LOW (ref 2.5–4.6)

## 2022-09-21 LAB — VITAMIN B6: Vitamin B6: 1.1 ug/L — ABNORMAL LOW (ref 3.4–65.2)

## 2022-09-21 LAB — MAGNESIUM
Magnesium: 1.9 mg/dL (ref 1.7–2.4)
Magnesium: 2.2 mg/dL (ref 1.7–2.4)

## 2022-09-21 LAB — VITAMIN A: Vitamin A (Retinoic Acid): 12.6 ug/dL — ABNORMAL LOW (ref 22.0–69.5)

## 2022-09-21 MED ORDER — MAGNESIUM SULFATE 2 GM/50ML IV SOLN
2.0000 g | Freq: Once | INTRAVENOUS | Status: AC
  Administered 2022-09-21: 2 g via INTRAVENOUS
  Filled 2022-09-21: qty 50

## 2022-09-21 MED ORDER — THIAMINE MONONITRATE 100 MG PO TABS
100.0000 mg | ORAL_TABLET | Freq: Every day | ORAL | Status: DC
  Administered 2022-09-22 – 2022-09-24 (×3): 100 mg
  Filled 2022-09-21 (×3): qty 1

## 2022-09-21 MED ORDER — SODIUM PHOSPHATES 45 MMOLE/15ML IV SOLN
30.0000 mmol | Freq: Once | INTRAVENOUS | Status: AC
  Administered 2022-09-21: 30 mmol via INTRAVENOUS
  Filled 2022-09-21: qty 10

## 2022-09-21 MED ORDER — AMOXICILLIN-POT CLAVULANATE 875-125 MG PO TABS
1.0000 | ORAL_TABLET | Freq: Two times a day (BID) | ORAL | Status: DC
  Administered 2022-09-21 – 2022-09-24 (×7): 1
  Filled 2022-09-21 (×7): qty 1

## 2022-09-21 MED ORDER — MAGNESIUM SULFATE 2 GM/50ML IV SOLN: 2.0000 g | Freq: Once | INTRAVENOUS | Status: DC

## 2022-09-21 NOTE — Progress Notes (Addendum)
NAME:  Derek Oconnell, MRN:  [id number], DOB:  07-26-1955, LOS: 6 ADMISSION DATE:  09/15/2022, CONSULTATION DATE:  9/3 REFERRING MD:  [facility], CHIEF COMPLAINT:  Siezures   History of Present Illness:  67 year old male presented to [facility] 9/3 with AMS and seizure-like activity. In ED, he had persistent AMS along with additional seizure-like activity.  He was ultimately intubated and sedated.  He was later transferred to [facility] for further evaluation management including LTM.  Extubated yesterday. No seizure activity on EEG. Awake and alert, however remains confused. Oriented to self and place only.  Pertinent  Medical History  HTN, CHF, CAD s/p stenting and PPM, COPD, EtOH dependence, sacral pressure injury  Significant Hospital Events: Including procedures, antibiotic start and stop dates in addition to other pertinent events   9/3 admitted and intubated at outside hospital  9/5 extubated 9/6 MRI brain w acute cortical infarcts R parietal lobe 9/7. Incr NE, low grade temp, + incr LA + GGO RUL on CTA h/n --> + unasyn    Interim History / Subjective:  NAEON. Off levo since yesterday AM. Awake, answering questions appropriately. PICC placed yesterday.  Objective   Blood pressure 102/60, pulse 99, temperature 98.5 F (36.9 C), temperature source Oral, resp. rate 19, height 5\' 10"  (1.778 m), weight 60.5 kg, SpO2 95%.        Intake/Output Summary (Last 24 hours) at 09/21/2022 0748 Last data filed at 09/21/2022 0700 Gross per 24 hour  Intake 2850.66 ml  Output 1825 ml  Net 1025.66 ml   Filed Weights   09/20/22 0358 09/20/22 2100 09/21/22 0500  Weight: 61.3 kg 60.5 kg 60.5 kg    Examination:   General: chronically and acutely ill male, in NAD  Neuro: Awake, answering questions, follows commands HEENT: NCAT. Cortrak Lungs: faint rhonchi Cardiovascular: RRR, no M/R/G GI: BS x 4, S/NT/ND Extremities: no acute joint deformity  Skin: scattered ecchymosis    Assessment & Plan:   Acute  encephalopathy, improving EtOH abuse w withdrawal Concern for etoh withdrawal sz Acute cortical infarcts, R parietal love  P -neuro following -CIWA, micronutrient support, no AED indicated -stroke workup underway, likely cardioembolic  -ASA   Acute respiratory failure with hypoxia Suspected aspiration PNA  -started unasyn 9/7 PM w incr LA low grade temp, GGOs on CTA head/neck w known dysphagia. PCT was pretty benign and no leukocytosis.  P -CXR pending, then PRN -f/u sputum cx -- so far rare GPCs (MRSA PCR neg)  -change unasyn to augmentin today x 5 days -wean O2, goal 88-92  Chronic hypotension + possible septic shock  -does have degree of chronic hypotension, SBP 90s-100 outpt -gII diastolic HF on ECHO -incr NE req 9/7-9/8 w concomitant incr LA, + low grade temp and GGOs  -UA benign. Bcx sent  -considered DVT/PE as well (low grade temp incr O2, incr LA) but has been on dvt ppx + is thrombocytopenic + has GGOs to explain resp changes. No physical exam findings c/w DVT.  P -unasyn as above -f/u cx data -continue supportive care  Diastolic HF Hx HFrEF (now recovered) s/p BiV ICD (04/2022) Hx CAD Hx HLD Hx PVD s/p stents, aortobifem bypass  -LVEF 2/24 was 25-30%, mildly reduced RV fxn. Biv ICD 4/24. This admission LVEF 60-65%, gII dd, poorly visualized RV systolic fxn  P -ASA alone for now per neuro service -- was DAPT at home  -cont statin  -follows w cards at [facility]. Have not been able to do much by way of GDMT,  limited by chronic hypotension   Moderate protein calorie malnutrition Refeeding syndrome  Hypomagnesemia P -replace as needed -EN -remeron restarted 9/7  Anemia Thrombocytopenia  -chronic, 2/2 etoh  P -Transfuse for Hgb < 7 -ASA alone for now per neuro service -- was DAPT at home   Physical deconditioning -PT/OT -ultimately probably needs CIR vs SNF when ready  -TOC consulted   Chronic esophageal dysphagia, pharyngoesophageal phase  -EN via cortrak    Tobacco use  -encouraged cessation   Off levophed for roughly 24 hours. Stable for transfer out to progressive care. Will ask [facility] to assume care in AM 9/10 with PCCM off.   Best Practice (right click and "Reselect all SmartList Selections" daily)   Diet/type: tubefeeds DVT prophylaxis: prophylactic heparin  GI prophylaxis: PPI Lines: N/A Foley:  N/A Code Status:  full code Last date of multidisciplinary goals of care discussion 9/6   Rutherford Guys, PA - C [place] [facility] For pager details, please see AMION or use Epic chat  After 1900, please call ELINK for cross coverage needs 09/21/2022, 8:09 AM  I agree with the Advanced Practitioner's note, impression, and recommendations as outlined. I have taken an independent interval history, reviewed the chart and examined the patient.  My medical decision making is as follows:   Subjective: 67 yo man with severe CAD/PVD, s/p BiV ICD placement in 2024, alcohol use disorder here with etoh withdrawal seizures.   Objective: Blood pressure 102/60, pulse 99, temperature 97.8 F (36.6 C), resp. rate 19, height 5\' 10"  (1.778 m), weight 60.5 kg, SpO2 95%.  Chronically ill appearing Breathing nonlabored, on nasal cannula Cortrack in place No peripheral edema Tachycardic, regular   Labs/Imaging: Na 140 K 4.3 Cr 1.22 Phos 3.6 Mg 1.9  Blood cultures negative Resp cx - GPCs MRSA swab negative  Assessment and Plan:  Acute hypoxemic respiratory failure Aspiration pneumonia Alcohol withdrawal syndrome with seizures Deconditioning CAD/PVD, S/p BiV ICD Septic shock secondary to aspiration pna - shock resolved, off pressors.  Tobacco use disorder  Continue amox-clav for aspiration pna.  Needs PT/OT.  Continues to require SLP and NPO status for now. Probably needs rehab at discharge.  Enteral nutrition through cotrak Daily BMET  Patient is signed out to [facility] to assume care. Will sign out of ICU to  progressive care.   Charlott Holler [place] [facility] 09/21/2022 10:13 AM  Pager: see AMION  If no response to pager, please call critical care on call (see AMION) until 7pm After 7:00 pm call Elink

## 2022-09-21 NOTE — Progress Notes (Signed)
Inpatient Rehab Admissions Coordinator:  ? ?Per therapy recommendations,  patient was screened for CIR candidacy by Laura Staley, MS, CCC-SLP. At this time, Pt. Appears to be a a potential candidate for CIR. I will place   order for rehab consult per protocol for full assessment. Please contact me any with questions. ? ?Laura Staley, MS, CCC-SLP ?Rehab Admissions Coordinator  ?[phone] (celll) ?[phone] (office) ? ?

## 2022-09-21 NOTE — Evaluation (Signed)
Occupational Therapy Evaluation Patient Details Name: Derek Oconnell MRN: [id number] DOB: 06-27-1955 Today's Date: 09/21/2022   History of Present Illness 67 y.o. male admitted 9/3 with unresponsive episode at home with seizure like activity. Intubated 9/3-9/5. 9/4 EEG with encephalopathy. 9/6 acute cortical infarcts R parietal lobe.  PMhx: HTN, HFrEF with EF 25-30%, CAD, COPD, HTN, biventricular ICD, heavy EtOH use   Clinical Impression   Derek Oconnell was evaluated s/p the above admission list. He is indep and lives with his wife at baseline. Upon evaluation the pt was limited by impaired cognition, generalized weakness, unsteady balance, incontinent loose stool and poor activity tolerance. Overall he needed mod A for bed mobility and min A to stand EOB. Pt tolerated static standing for ~5 minutes during pericare and linen change. Due to the deficits listed below the pt also needs up to max A for LB ADLs and set up A for UB ADLs in sitting . Pt will benefit from continued acute OT services and intensive inpatient follow up therapy, >3 hours/day after discharge.        If plan is discharge home, recommend the following: A lot of help with walking and/or transfers;A lot of help with bathing/dressing/bathroom;Assistance with cooking/housework;Direct supervision/assist for medications management;Direct supervision/assist for financial management;Assist for transportation    Functional Status Assessment  Patient has had a recent decline in their functional status and demonstrates the ability to make significant improvements in function in a reasonable and predictable amount of time.  Equipment Recommendations  None recommended by OT    Recommendations for Other Services Rehab consult     Precautions / Restrictions Precautions Precautions: Fall;Other (comment) Precaution Comments: stool incontinence, cortrak Restrictions Weight Bearing Restrictions: No      Mobility Bed Mobility Overal bed  mobility: Needs Assistance Bed Mobility: Supine to Sit, Sit to Supine     Supine to sit: Mod assist Sit to supine: Mod assist        Transfers Overall transfer level: Needs assistance Equipment used: Rolling walker (2 wheels) Transfers: Sit to/from Stand Sit to Stand: Min assist           General transfer comment: prolonged standing for pericare and linen change; min A for balance and min A fro side stepping at EOB      Balance Overall balance assessment: Needs assistance Sitting-balance support: Feet supported, No upper extremity supported Sitting balance-Leahy Scale: Fair     Standing balance support: Bilateral upper extremity supported, During functional activity, Reliant on assistive device for balance Standing balance-Leahy Scale: Poor                             ADL either performed or assessed with clinical judgement   ADL Overall ADL's : Needs assistance/impaired Eating/Feeding: NPO Eating/Feeding Details (indicate cue type and reason): cortrak Grooming: Set up;Sitting   Upper Body Bathing: Set up;Sitting   Lower Body Bathing: Maximal assistance;Sit to/from stand   Upper Body Dressing : Set up;Sitting   Lower Body Dressing: Maximal assistance;Sit to/from stand   Toilet Transfer: Minimal assistance;Stand-pivot;Rolling walker (2 wheels);BSC/3in1   Toileting- Clothing Manipulation and Hygiene: Maximal assistance;Sit to/from stand Toileting - Clothing Manipulation Details (indicate cue type and reason): requires BUE support     Functional mobility during ADLs: Minimal assistance;Rolling walker (2 wheels) General ADL Comments: poor activity tolerance, weakness, poor insight, incontinent stool     Vision Baseline Vision/History: 1 Wears glasses Vision Assessment?: No apparent visual deficits Additional Comments: does  not have glasses acutely but vision was [facility]     Perception Perception: Not tested       Praxis Praxis: Not tested        Pertinent Vitals/Pain Pain Assessment Pain Assessment: Faces Faces Pain Scale: Hurts a little bit Pain Location: tingling in LLE Pain Descriptors / Indicators: Discomfort Pain Intervention(s): Limited activity within patient's tolerance, Monitored during session     Extremity/Trunk Assessment Upper Extremity Assessment Upper Extremity Assessment: RUE deficits/detail;LUE deficits/detail RUE Deficits / Details: ROM is WFL, generalized weakness RUE Sensation: WNL RUE Coordination: WNL LUE Deficits / Details: globally 4/5; denies paraesthesias. poor coordination LUE Sensation: WNL LUE Coordination: decreased fine motor   Lower Extremity Assessment Lower Extremity Assessment: Defer to PT evaluation   Cervical / Trunk Assessment Cervical / Trunk Assessment: Kyphotic   Communication Communication Communication: No apparent difficulties   Cognition Arousal: Alert Behavior During Therapy: Flat affect Overall Cognitive Status: Impaired/Different from baseline Area of Impairment: Memory, Following commands, Safety/judgement, Awareness, Problem solving                   Current Attention Level: Sustained Memory: Decreased recall of precautions, Decreased short-term memory Following Commands: Follows one step commands consistently Safety/Judgement: Decreased awareness of safety, Decreased awareness of deficits Awareness: Emergent Problem Solving: Slow processing, Decreased initiation, Difficulty sequencing, Requires verbal cues General Comments: flat affect, follows simple commands with increased time. Did not recall getting OOB 2 days ago (with PT). Unaware that he had a CVA, limited insight     General Comments  VSS on 2L     Home Living Family/patient expects to be discharged to:: Private residence Living Arrangements: Spouse/significant other Available Help at Discharge: Family;Available 24 hours/day Type of Home: House Home Access: Stairs to enter [employer] of Steps: 7   Home Layout: Multi-level;Bed/bath upstairs Alternate Level Stairs-Number of Steps: flight   Bathroom Shower/Tub: Tub/shower unit         Home Equipment: None          Prior Functioning/Environment Prior Level of Function : Independent/Modified Independent             Mobility Comments: no AD ADLs Comments: retired, drives        OT Problem List: Decreased strength;Decreased range of motion;Decreased activity tolerance;Impaired balance (sitting and/or standing);Decreased safety awareness;Decreased knowledge of use of DME or AE;Decreased knowledge of precautions      OT Treatment/Interventions: Self-care/ADL training;Therapeutic exercise;DME and/or AE instruction;Therapeutic activities;Patient/family education;Balance training    OT Goals(Current goals can be found in the care plan section) Acute Rehab OT Goals Patient Stated Goal: to go home OT Goal Formulation: With patient Time For Goal Achievement: 10/05/22 Potential to Achieve Goals: Good ADL Goals Pt Will Perform Grooming: with supervision;standing Pt Will Perform Upper Body Dressing: with modified independence Pt Will Perform Lower Body Dressing: with contact guard assist;sit to/from stand Pt Will Transfer to Toilet: with contact guard assist;ambulating  OT Frequency: Min 1X/week       AM-PAC OT "6 Clicks" Daily Activity     Outcome Measure Help from another person eating meals?: Total Help from another person taking care of personal grooming?: A Little Help from another person toileting, which includes using toliet, bedpan, or urinal?: A Lot Help from another person bathing (including washing, rinsing, drying)?: A Lot Help from another person to put on and taking off regular upper body clothing?: A Little Help from another person to put on and taking off regular lower body clothing?: A Lot  6 Click Score: 13   End of Session Equipment Utilized During Treatment: Gait  belt;Rolling walker (2 wheels) Nurse Communication: Mobility status (incontinent loose stool)  Activity Tolerance: Patient tolerated treatment well Patient left: in bed;with call bell/phone within reach;with bed alarm set  OT Visit Diagnosis: Unsteadiness on feet (R26.81);Other abnormalities of gait and mobility (R26.89);Muscle weakness (generalized) (M62.81)                Time: 1340-1416 OT Time Calculation (min): 36 min Charges:  OT General Charges $OT Visit: 1 Visit OT Evaluation $OT Eval Moderate Complexity: 1 Mod OT Treatments $Self Care/Home Management : 8-22 mins  Derenda Mis, OTR/L Acute Rehabilitation Services Office [phone] Secure Chat Communication Preferred   Donia Pounds 09/21/2022, 2:31 PM

## 2022-09-21 NOTE — Progress Notes (Signed)
Speech Language Pathology Treatment: Dysphagia  Patient Details Name: Derek Oconnell MRN: [id number] DOB: [birth year] Today's Date: 09/21/2022 Time: 4010-2725 SLP Time Calculation (min) (ACUTE ONLY): 8 min  Assessment / Plan / Recommendation Clinical Impression  Pt is alert this morning, still deconditioned but suspect with improving vocal quality. He believes that his voice is near his baseline. His oral phase seems to be improved compared to previous documentation with no anterior loss or oral holding observed. He continues to have a delayed cough but it is more intermittent. Pt may be ready to attempt MBS at this point, but unfortunately he declines having this done. He says that he has had one previously at [facility] (reviewed documentation from Care Everywhere - full report not found, but "swallow study" referenced was reported to be normal, no aspiration; esophageal dysphagia suspected). SLP provided education about rationale for completing a repeat study given multiple acute risk factors. Pt is agreeable to consider further testing, but clarifies that he does not want to do it today. Would therefore keep NPO except for limited amounts of water (small amounts of water (1/2 tsp or less) by spoon, after good oral care, in moderation, when fully awake/alert, with upright positioning and 1:1 assistance).   HPI HPI: 67 year old male presented to [facility] 9/3 with AMS and seizure-like activity. In ED, he had persistent AMS along with additional seizure-like activity. He was ultimately intubated and sedated. He was later transferred to [facility] for further evaluation management including LTM. ETT 9/3-5. CXR 9/3 clear. Head CT 9/3 with no acute findings. MRI 9/6 revealed acute cortical infarcts in the R parietal lobe. Pt with pmhx HTN, CHF, CAD s/p stenting and PPM, COPD, EtOH dependence.ST evaluation recommended NPO status on 09/18/22.  ST f/u for po readiness/MBS completion as pt able.      SLP Plan  Continue with current  plan of care;MBS      Recommendations for follow up therapy are one component of a multi-disciplinary discharge planning process, led by the attending physician.  Recommendations may be updated based on patient status, additional functional criteria and insurance authorization.    Recommendations  Diet recommendations: NPO Medication Administration: Via alternative means                  Oral care QID;Oral care prior to ice chip/H20;Staff/trained caregiver to provide oral care   Frequent or constant Supervision/Assistance Dysphagia, unspecified (R13.10)     Continue with current plan of care;MBS     Mahala Menghini., M.A. CCC-SLP Acute Rehabilitation Services Office [phone]  Secure chat preferred   09/21/2022, 9:51 AM

## 2022-09-21 NOTE — Progress Notes (Signed)
STROKE TEAM PROGRESS NOTE   BRIEF HPI Mr. Derek Oconnell is a 68 y.o. male with history of PVD, CAD s/p stenting 2 years ago and pacemaker placement in April, also with a history of heavy EtOH use, presenting to the ED via EMS this afternoon after recurrent spells of tonic flexor posturing of BUE with unresponsiveness. The posturing spells were preceded by a 3 day history of malaise, decreased PO intake and diarrhea. Last drink of Bourbon, per wife, was estimated to have been on Friday. He drinks about one 1.5 L bottle of Bourbon every 4-5 days and is a long-time chronic drinker who "nurses" his drinks from approximately the afternoon to late evening/night every day.   He was intubated and extubated. MRI brain revealed right parietal infarcts   SIGNIFICANT HOSPITAL EVENTS 9/3 admitted and intubated at outside hospital. Neurology following for seizures  9/5 extubated 9/6 MRI brain w acute cortical infarcts R parietal lobe   INTERIM HISTORY/SUBJECTIVE Patient is awake and sitting up in the bed.  His wife is at the bedside.  Patient is more alert and interactive today.  No focal deficits ICD interrogation by cardiology shows no evidence of A-fib   OBJECTIVE  CBC    Component Value Date/Time   WBC 5.7 09/19/2022 1351   RBC 2.75 (L) 09/19/2022 1351   HGB 9.4 (L) 09/19/2022 1351   HCT 28.9 (L) 09/19/2022 1351   PLT 110 (L) 09/19/2022 1351   MCV 105.1 (H) 09/19/2022 1351   MCH 34.2 (H) 09/19/2022 1351   MCHC 32.5 09/19/2022 1351   RDW 18.4 (H) 09/19/2022 1351    BMET    Component Value Date/Time   NA 140 09/21/2022 0225   K 4.3 09/21/2022 0225   CL 108 09/21/2022 0225   CO2 21 (L) 09/21/2022 0225   GLUCOSE 133 (H) 09/21/2022 0225   BUN 41 (H) 09/21/2022 0225   CREATININE 1.22 09/21/2022 0225   CALCIUM 7.3 (L) 09/21/2022 0225   GFRNONAA >60 09/21/2022 0225    IMAGING past 24 hours No results found.  Vitals:   09/21/22 1000 09/21/22 1100 09/21/22 1200 09/21/22 1300  BP: 109/66  98/78 104/63 (!) 57/34  Pulse: 96 (!) 104 (!) 105 (!) 106  Resp: 18 20 20  (!) 21  Temp:   98 F (36.7 C)   TempSrc:      SpO2: 95% 95% 96% 97%  Weight:      Height:         PHYSICAL EXAM General: Pleasant middle-age Caucasian male in no acute distress Psych:  Mood and affect appropriate for situation CV: Regular rate and rhythm on monitor Respiratory:  Regular, unlabored respirations on room air GI: Abdomen soft and nontender   NEURO:  Exam patient is awake and alert Mental Status: AA&Ox3, slow responses  Speech/Language: speech is dysarthric.   Cranial Nerves:  II: PERRL. Visual fields full.  III, IV, VI: EOMI. Eyelids elevate symmetrically.  V: Sensation is intact to light touch and symmetrical to face.  VII: Face is symmetrical resting and smiling VIII: hearing intact to voice. IX, X: Palate elevates symmetrically. Phonation is normal.  ZO:[id number] shrug 5/5. XII: tongue is midline without fasciculations. Motor:  moves all 4 extremities spontaneous and antigravity,  Tone: is normal and bulk is normal Sensation- Intact to light touch bilaterally. Extinction absent to light touch to DSS.   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 LDL 47 HgbA1c 5.1 VTE prophylaxis -SCDs aspirin 81 mg daily and clopidogrel 75 mg daily prior to admission, now on aspirin 81 mg daily alone due to thrombocytopenia Therapy recommendations: Pending Disposition: Pending  Seizures-provoked in the setting of alcohol withdrawal On no AEDs Seizure precautions  Hypertension Currently with hypotension Diastolic heart failure CAD PVD s/p stents and aortobifem bypass Home meds: Aspirin,  Plavix, lisinopril 2.5 mg, metoprolol 25 mg, UnStable On Levophed drip Blood Pressure Goal: BP less than 220/110   Hyperlipidemia Home meds: Atorvastatin 80 mg, resumed in hospital LDL ordered., goal < 70   Continue statin at discharge  Diabetes type II Controlled Home meds: None HgbA1c 5.1 goal < 7.0 CBGs SSI Recommend close follow-up with PCP for better DM control  Tobacco Abuse Patient smokes cigarettes daily Nicotine replacement therapy provided  EtOH abuse On CIWA protocol  Dysphagia Patient has post-stroke dysphagia, SLP consulted    Diet   Diet NPO time specified   Advance diet as tolerated  Other Stroke Risk Factors ETOH use,  advised to drink no more than 2 drink(s) a day Coronary artery disease Congestive heart failure  Other Active Problems Thrombocytopenia Malnutrition Hypokalemia-managed per primary team For phosphatemia managed per primary team  Hospital day # 6  Gevena Mart DNP, ACNPC-AG  [facility]  Patient's neurological status is improving as he is on less sedation now.  Continue mobilization out of bed and therapy consults.  ICD interrogation does not show evidence of atrial fibrillation.  Continue aspirin alone for stroke prevention due to thrombo-cytopenia and fall risk.  Long discussion with patient and wife at the bedside and answered questions.  Greater than 50% time during this 50-minute visit was spent in counseling and coordination of care about his embolic stroke and discussion about evaluation and treatment and prevention of stroke and answered questions.  Stroke team will sign off.  Kindly call for questions  Delia Heady, MD Medical Director [facility] Pager: [phone] 09/21/2022 1:24 PM     To contact Stroke Continuity provider, please refer to [email or web address]. After hours, contact General Neurology

## 2022-09-21 NOTE — Progress Notes (Addendum)
Nutrition Follow-up  DOCUMENTATION CODES:   Underweight, Severe malnutrition in context of social or environmental circumstances  INTERVENTION:  Continue tube feeding via Cortrak tube: Vital 1.5 at goal rate of 50 ml/hr (1200 ml per day) Prosource TF20 60 ml daily   Provides 1880 kcal, 101 gm protein, 912 ml free water daily   Continue MVI with minerals, folic acid, and thiamine   Continue Juven BID 2x daily per tube   Check Vitamin A, Zinc, Vitamin B6 levels (not returned yet - 9/9)   NUTRITION DIAGNOSIS:   Severe Malnutrition related to social / environmental circumstances as evidenced by severe fat depletion, severe muscle depletion.  GOAL:   Patient will meet greater than or equal to 90% of their needs  MONITOR:   TF tolerance, Labs  REASON FOR ASSESSMENT:   Consult Enteral/tube feeding initiation and management  ASSESSMENT:   Pt with PMH of alcohol abuse, CHF with ICH, and COPD admitted from home after witnessed seizure. Admitted to [facility] tx to [facility] for LTM. Pt usually receives care at [facility].  Meds reviewed: lipitor, folic acid, remeron, MVI, Juven, thiamine. Labs reviewed: BUN elevated.   Pt has been extubated to nasal cannula since last assessment. Pt was evaluated by SLP today who recommends NPO status. Pt is now oriented x3. Cortrak remains in place and tube feeds are running as ordered. RN reports that the pt is tolerating tube feeds well with no issues. Will continue to closely monitor.   Diet Order:   Diet Order             Diet NPO time specified  Diet effective now                   EDUCATION NEEDS:   Not appropriate for education at this time  Skin:  Skin Assessment: Skin Integrity Issues: Skin Integrity Issues:: Stage I, DTI DTI: sacrum Stage I: Vertebral column (mid back)  Last BM:  9/8 - type 6  Height:   Ht Readings from Last 1 Encounters:  09/15/22 5\' 10"  (1.778 m)    Weight:   Wt Readings from Last 1 Encounters:  09/21/22  60.5 kg    Ideal Body Weight:     BMI:  Body mass index is 19.14 kg/m.  Estimated Nutritional Needs:   Kcal:  1800-2000  Protein:  90-115 grams  Fluid:  >1.8 L/day  Bethann Humble, RD, LDN, CNSC.

## 2022-09-21 NOTE — Progress Notes (Signed)
eLink Physician-Brief Progress Note Patient Name: Derek Oconnell DOB: July 05, 1955 MRN: [id number]   Date of Service  09/21/2022  HPI/Events of Note  Phosphorus 2.0, potassium 4.9  eICU Interventions  Sodium phosphate ordered.     Intervention Category Minor Interventions: Electrolytes abnormality - evaluation and management  Ahmiya Abee 09/21/2022, 8:18 PM

## 2022-09-21 NOTE — Evaluation (Signed)
Speech Language Pathology Evaluation Patient Details Name: Derek Oconnell MRN: [id number] DOB: [birth year] Today's Date: 09/21/2022 Time: 4098-1191 SLP Time Calculation (min) (ACUTE ONLY): 10 min  Problem List:  Patient Active Problem List   Diagnosis Date Noted   Aspiration pneumonia of right upper lobe (HCC) 09/20/2022   Septic shock (HCC) 09/20/2022   Hypotension 09/19/2022   Acute ischemic stroke (HCC) 09/19/2022   Physical deconditioning 09/19/2022   Moderate protein-calorie malnutrition (HCC) 09/19/2022   Acute encephalopathy 09/19/2022   Pressure injury of sacral region, stage 1 09/18/2022   Protein-calorie malnutrition, severe 09/17/2022   Status epilepticus (HCC) 09/15/2022   Past Medical History:  Past Medical History:  Diagnosis Date   Myocardial infarction (HCC) 1989   Past Surgical History:  Past Surgical History:  Procedure Laterality Date   FEMORAL-POPLITEAL BYPASS GRAFT Bilateral 2021   GALLBLADDER SURGERY  2019   VASECTOMY  [id number]   HPI:  67 year old male presented to [facility] 9/3 with AMS and seizure-like activity. In ED, he had persistent AMS along with additional seizure-like activity. He was ultimately intubated and sedated. He was later transferred to [facility] for further evaluation management including LTM. ETT 9/3-5. CXR 9/3 clear. Head CT 9/3 with no acute findings. MRI 9/6 revealed acute cortical infarcts in the R parietal lobe. Pt with pmhx HTN, CHF, CAD s/p stenting and PPM, COPD, EtOH dependence.ST evaluation recommended NPO status on 09/18/22.  ST f/u for po readiness/MBS completion as pt able.   Assessment / Plan / Recommendation Clinical Impression  Pt presents with what is suspected to be an acute change in cognition and communication. He reports that at baseline he is usually very independent other than needing assistance managing finances (no family present to confirm). Today he is oriented and follows simple one-step commands well, but also exhibits reduced  sustained attention and intellectual awareness. He has trouble with storage of information during delayed recall task, but was then able to remember the words later with minimal cueing. He completed a confrontational naming task with 100% accuracy but during divergent naming he could only name one item. Overall processing of information and organization of thought appears to be reduced. Recommend SLP f/u acutely and at next level of care.    SLP Assessment  SLP Recommendation/Assessment: Patient needs continued Speech Lanaguage Pathology Services SLP Visit Diagnosis: Cognitive communication deficit (R41.841)    Recommendations for follow up therapy are one component of a multi-disciplinary discharge planning process, led by the attending physician.  Recommendations may be updated based on patient status, additional functional criteria and insurance authorization.    Follow Up Recommendations  Skilled nursing-short term rehab (<3 hours/day)    Assistance Recommended at Discharge  Frequent or constant Supervision/Assistance  Functional Status Assessment Patient has had a recent decline in their functional status and demonstrates the ability to make significant improvements in function in a reasonable and predictable amount of time.  Frequency and Duration min 2x/week  2 weeks      SLP Evaluation Cognition  Overall Cognitive Status: Impaired/Different from baseline Arousal/Alertness: Awake/alert Orientation Level: Oriented to person;Oriented to place;Oriented to situation Attention: Sustained Sustained Attention: Impaired Sustained Attention Impairment: Verbal basic Memory: Impaired Memory Impairment: Storage deficit Awareness: Impaired Awareness Impairment: Intellectual impairment Safety/Judgment: Impaired       Comprehension  Auditory Comprehension Overall Auditory Comprehension: Appears within functional limits for tasks assessed Commands: Within Functional Limits (simple,  one-step commands)    Expression Expression Primary Mode of Expression: Verbal Verbal Expression Overall Verbal Expression: Impaired Initiation:  No impairment Automatic Speech: Name;Social Response Level of Generative/Spontaneous Verbalization: Conversation Naming: Impairment Confrontation: Within functional limits Divergent:  (could name one item in a given category) Pragmatics: Impairment Impairments: Abnormal affect;Eye contact Interfering Components: Attention Non-Verbal Means of Communication: Not applicable   Oral / Motor  Oral Motor/Sensory Function Overall Oral Motor/Sensory Function: Within functional limits Motor Speech Overall Motor Speech: Impaired Respiration: Within functional limits Phonation: Low vocal intensity Resonance: Within functional limits Articulation: Impaired Level of Impairment: Conversation Intelligibility: Intelligible            Mahala Menghini., M.A. CCC-SLP Acute Rehabilitation Services Office [phone]  Secure chat preferred  09/21/2022, 10:08 AM

## 2022-09-21 NOTE — Progress Notes (Signed)
[facility] ADULT ICU REPLACEMENT PROTOCOL   The patient does apply for the [facility] Adult ICU Electrolyte Replacment Protocol based on the criteria listed below:   1.Exclusion criteria: TCTS, ECMO, Dialysis, and Myasthenia Gravis patients 2. Is GFR >/= 30 ml/min? Yes.    Patient's GFR today is >60 3. Is SCr </= 2? Yes.   Patient's SCr is 1.22 mg/dL 4. Did SCr increase >/= 0.5 in 24 hours? No. 5.Pt's weight >40kg  Yes.   6. Abnormal electrolyte(s): Mag 1.9  7. Electrolytes replaced per protocol 8.  Call MD STAT for K+ </= 2.5, Phos </= 1, or Mag </= 1 Physician:  Laray Anger 09/21/2022 4:16 AM

## 2022-09-22 ENCOUNTER — Inpatient Hospital Stay (HOSPITAL_COMMUNITY): Payer: Medicare Other

## 2022-09-22 DIAGNOSIS — G40901 Epilepsy, unspecified, not intractable, with status epilepticus: Secondary | ICD-10-CM | POA: Diagnosis not present

## 2022-09-22 LAB — CULTURE, RESPIRATORY W GRAM STAIN

## 2022-09-22 LAB — BASIC METABOLIC PANEL
Anion gap: 11 (ref 5–15)
BUN: 46 mg/dL — ABNORMAL HIGH (ref 8–23)
CO2: 23 mmol/L (ref 22–32)
Calcium: 7.5 mg/dL — ABNORMAL LOW (ref 8.9–10.3)
Chloride: 106 mmol/L (ref 98–111)
Creatinine, Ser: 1.3 mg/dL — ABNORMAL HIGH (ref 0.61–1.24)
GFR, Estimated: >60 mL/min (ref >60)
Glucose, Bld: 231 mg/dL — ABNORMAL HIGH (ref 70–99)
Potassium: 4.3 mmol/L (ref 3.5–5.1)
Sodium: 140 mmol/L (ref 135–145)

## 2022-09-22 LAB — GLUCOSE, CAPILLARY
Glucose-Capillary: 115 mg/dL — ABNORMAL HIGH (ref 70–99)
Glucose-Capillary: 127 mg/dL — ABNORMAL HIGH (ref 70–99)
Glucose-Capillary: 98 mg/dL (ref 70–99)
Glucose-Capillary: 131 mg/dL — ABNORMAL HIGH (ref 70–99)
Glucose-Capillary: 114 mg/dL — ABNORMAL HIGH (ref 70–99)

## 2022-09-22 LAB — CBC
HCT: 22.5 % — ABNORMAL LOW (ref 39.0–52.0)
Hemoglobin: 7.3 g/dL — ABNORMAL LOW (ref 13.0–17.0)
MCH: 33.6 pg (ref 26.0–34.0)
MCHC: 32.4 g/dL (ref 30.0–36.0)
MCV: 103.7 fL — ABNORMAL HIGH (ref 80.0–100.0)
Platelets: 167 10*3/uL (ref 150–400)
RBC: 2.17 MIL/uL — ABNORMAL LOW (ref 4.22–5.81)
RDW: 18.5 % — ABNORMAL HIGH (ref 11.5–15.5)
WBC: 8.2 10*3/uL (ref 4.0–10.5)
nRBC: 0 % (ref 0.0–0.2)

## 2022-09-22 LAB — ZINC: Zinc: 33 ug/dL — ABNORMAL LOW (ref 44–115)

## 2022-09-22 LAB — PHOSPHORUS: Phosphorus: 6.6 mg/dL — ABNORMAL HIGH (ref 2.5–4.6)

## 2022-09-22 LAB — MAGNESIUM: Magnesium: 2 mg/dL (ref 1.7–2.4)

## 2022-09-22 NOTE — Progress Notes (Signed)
Progress Note   Patient: Derek Oconnell ZOX:[id number] DOB: 1955-04-24 DOA: 09/15/2022     7 DOS: the patient was seen and examined on 09/22/2022   Brief hospital course: 67yo with h/o HTN, CAD, pacemaker placement, COPD, and ETOH use d/o who presented to [facility] on 9/3 with AMS and seizure-like activity.  He was intubated and transferred to [facility] for LTM.  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  He required pressors and developed low-grade fever with GGO in the RUL and CTA and so was started on Unasyn for aspiration PNA.  He has since weaned off norepinephrine and was transferred out of ICU on 9/9.  Assessment and Plan:  Acute encephalopathy, improving EtOH abuse w withdrawal, concern for etoh withdrawal sz Acute CVA - cortical infarcts, R parietal lobe  Patient admitted with AMS, concern for seizures LTM EEG negative MRI with cortical infarcts in R parietal lobe  Neuro/stroke team is following ASA monotherapy due to thrombocytopenia (not currently present) CIWA, micronutrient support, no AED indicated   Acute respiratory failure with hypoxia due to aspiration PNA  Started unasyn 9/7 PM w incr LA low grade temp, GGOs on CTA head/neck w known dysphagia 9/8 CXR with increased bibasilar infiltrates Sputum culture with abundant Staph lugdenensis, pansensitive Unasyn -> Augmentin through 9/14 Wean O2, goal 88-92    Chronic hypotension + possible septic shock  Chronic hypotension with SBP 90s-100 outpt Increased NE req 9/7-9/8 w concomitant incr LA, + low grade temp and GGOs  Resolved with treatment, now off pressors   Chronic diastolic HF, CAD, HLD, PVD Hx HFrEF (now recovered) s/p BiV ICD (04/2022) Hx PVD s/p stents, aortobifem bypass  LVEF 02/2022 was 25-30%, mildly reduced RV fxn. Biv ICD 04/2022. This admission LVEF 60-65%, gII dd, poorly visualized RV systolic fxn  Continue ASA alone for now per neuro service - was on DAPT PTA Continue  statin  Follows w cards at [facility].  GDMT has been limited by chronic hypotension    Malnutrition with Refeeding syndrome  Enteral nutrition via Cortrak Remeron restarted 9/7 Nutrition Problem: Severe Malnutrition Etiology: social / environmental circumstances Signs/Symptoms: severe fat depletion, severe muscle depletion Interventions: Prostat, MVI, Juven, Tube feeding   Pressure ulcer Pressure Injury 09/15/22 Vertebral column Mid Stage 1 -  Intact skin with non-blanchable redness of a localized area usually over a bony prominence. stage 1 mid back (Active)  09/15/22 2130  Location: Vertebral column  Location Orientation: Mid  Staging: Stage 1 -  Intact skin with non-blanchable redness of a localized area usually over a bony prominence.  Wound Description (Comments): stage 1 mid back  Present on Admission: Yes     Pressure Injury 09/15/22 Buttocks Right;Left;Lower Deep Tissue Pressure Injury - Purple or maroon localized area of discolored intact skin or blood-filled blister due to damage of underlying soft tissue from pressure and/or shear. DTI sacrum (Active)  09/15/22 2130  Location: Buttocks  Location Orientation: Right;Left;Lower  Staging: Deep Tissue Pressure Injury - Purple or maroon localized area of discolored intact skin or blood-filled blister due to damage of underlying soft tissue from pressure and/or shear.  Wound Description (Comments): DTI sacrum  Present on Admission: Yes   Anemia/Thrombocytopenia  Chronic, 2/2 etoh  Transfuse for Hgb < 7 ASA alone for now per neuro service - was on DAPT at home    Physical deconditioning PT/OT Ultimately probably needs CIR vs SNF when ready  TOC consulted    Chronic  esophageal dysphagia, pharyngoesophageal phase  Enteral nutrition via cortrak  SLP evaluation on 9/10, needs ongoing NPO status   Tobacco use  Encourage cessation     Consultants: PCCM Neurology SLP PT/OT Nutrition RT  Procedures: Intubation  9/3-5 Cortrak placement 9/4- LTM EEG 9/3-4  Antibiotics: Unasyn 9/7-9 Augmentin 9/10-14  30 Day Unplanned Readmission Risk Score    Flowsheet Row Admission (Current) from 09/15/2022 in [facility]  30 Day Unplanned Readmission Risk Score (%) 16 Filed at 09/22/2022 0801       This score is the patient's risk of an unplanned readmission within 30 days of being discharged (0 -100%). The score is based on dignosis, age, lab data, medications, orders, and past utilization.   Low:  0-14.9   Medium: 15-21.9   High: 22-29.9   Extreme: 30 and above           Subjective: He has no specific complaints.  Continues to cough.  Denies other issues.  I spoke with his wife.  He has the feeding tube.  He is still coughing.   Objective: Vitals:   09/22/22 0736 09/22/22 0800  BP:  103/60  Pulse:  (!) 107  Resp:  18  Temp: 98.8 F (37.1 C)   SpO2:  98%    Intake/Output Summary (Last 24 hours) at 09/22/2022 1407 Last data filed at 09/22/2022 1000 Gross per 24 hour  Intake 828.75 ml  Output 2300 ml  Net -1471.25 ml   Filed Weights   09/20/22 2100 09/21/22 0500 09/22/22 0341  Weight: 60.5 kg 60.5 kg 65.4 kg    Exam:  General:  Appears frail, ill Eyes:   EOMI, normal lids, iris ENT:  grossly normal hearing, lips & tongue, mmm Neck:  no LAD, masses or thyromegaly Cardiovascular:  RRR, no m/r/g. No LE edema.  Respiratory:   CTA bilaterally with no wheezes/rales/rhonchi.  Normal respiratory effort. Abdomen:  soft, NT, ND Skin:  no rash or induration seen on limited exam Musculoskeletal:  normal strength of BUE, diminished strength 2-3/5 of LLE and 3-4/5 RLE Psychiatric:  blunted mood and affect, speech sparse but appropriate, AOx3 Neurologic:  CN 2-12 grossly intact, moves all extremities in coordinated fashion  Data Reviewed: I have reviewed the patient's lab results since admission.  Pertinent labs for today include:   Glucose 231 BUN 46/Creatinine  1.3/GFR >60 WBC 8.2 Hgb 7.3, 9.4 on 9/7     Family Communication: None present; I spoke with his wife by telephone  Disposition: Status is: Inpatient Remains inpatient appropriate because: ongoing swallow dysfunction, management      Time spent: 50 minutes  Unresulted Labs (From admission, onward)     Start     Ordered   09/23/22 0500  Basic metabolic panel  Tomorrow morning,   R       Question:  Specimen collection method  Answer:  Unit=Unit collect   09/22/22 1406   09/23/22 0500  CBC with Differential/Platelet  Tomorrow morning,   R       Question:  Specimen collection method  Answer:  Unit=Unit collect   09/22/22 1406             Author: Jonah Blue, MD 09/22/2022 2:07 PM  For on call review [email or web address].

## 2022-09-22 NOTE — Progress Notes (Signed)
Speech Language Pathology Treatment: Dysphagia  Patient Details Name: Derek Oconnell MRN: [id number] DOB: June 25, 1955 Today's Date: 09/22/2022 Time: 2952-8413 SLP Time Calculation (min) (ACUTE ONLY): 6 min  Assessment / Plan / Recommendation Clinical Impression  Pt seen for ongoing dysphagia management.  Pt accepted trials of thin liquid by spoon and straw.  Pt with multiple swallows per bolus with all trials.  Pt with delayed cough on 1 of 2 trials by spoon.  Delayed but prolonged throat clearing with straw sip. Reiterated need for MBSS since pt has had a cue change in swallow function with this admission post extubation.  Pt agreeable to MBSS today.  Will schedule swallow study as radiology schedule permits.  Recommend pt remain NPO with alternate means of nutrition, hydration, and medication.  Pt may have small amounts of water by spoon, in moderation, after good oral care, when fully awake/alert, with upright positioning and direct supervision.     HPI HPI: 67 year old male presented to [facility] 9/3 with AMS and seizure-like activity. In ED, he had persistent AMS along with additional seizure-like activity. He was ultimately intubated and sedated. He was later transferred to [facility] for further evaluation management including LTM. ETT 9/3-5. CXR 9/3 clear. Head CT 9/3 with no acute findings. MRI 9/6 revealed acute cortical infarcts in the R parietal lobe. Pt with pmhx HTN, CHF, CAD s/p stenting and PPM, COPD, EtOH dependence.ST evaluation recommended NPO status on 09/18/22.  ST f/u for po readiness/MBS completion as pt able.      SLP Plan  MBS      Recommendations for follow up therapy are one component of a multi-disciplinary discharge planning process, led by the attending physician.  Recommendations may be updated based on patient status, additional functional criteria and insurance authorization.    Recommendations  Diet recommendations: NPO Medication Administration: Via alternative means                   Oral care QID;Oral care prior to ice chip/H20;Staff/trained caregiver to provide oral care   Frequent or constant Supervision/Assistance Dysphagia, unspecified (R13.10)     MBS     Kerrie Pleasure, MA, CCC-SLP Acute Rehabilitation Services Office: [phone] 09/22/2022, 9:47 AM

## 2022-09-22 NOTE — TOC Progression Note (Signed)
Transition of Care [facility]) - Progression Note    Patient Details  Name: Derek Oconnell MRN: [id number] Date of Birth: 27-Dec-1955  Transition of Care [facility]) CM/SW Contact  Tom-Johnson, Hershal Coria, RN Phone Number: 09/22/2022, 9:55 AM  Clinical Narrative:     CIR following for possible admission. CM continues to follow for discharge needs.       Expected Discharge Plan and Services                                               Social Determinants of Health (SDOH) Interventions SDOH Screenings   Food Insecurity: No Food Insecurity (02/01/2022)   Received from [facility]  Transportation Needs: No Transportation Needs (02/01/2022)   Received from [facility]  Financial Resource Strain: Low Risk  (02/01/2022)   Received from [facility]  Physical Activity: Inactive (05/20/2022)   Received from [facility]  Social Connections: Moderately Isolated (01/11/2018)   Received from [facility]  Stress: No Stress Concern Present (07/17/2021)   Received from [facility]  Tobacco Use: High Risk (09/15/2022)  Health Literacy: Low Risk  (04/21/2020)   Received from [facility]    Readmission Risk Interventions    09/17/2022   10:42 AM  Readmission Risk Prevention Plan  Post Dischage Appt Complete  Medication Screening Complete  Transportation Screening Complete

## 2022-09-22 NOTE — Hospital Course (Signed)
17XW with h/o HTN, CAD, pacemaker placement, COPD, and ETOH use d/o who presented to [facility] on 9/3 with AMS and seizure-like activity.  He was intubated and transferred to [facility] for LTM.  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  He required pressors and developed low-grade fever with GGO in the RUL and CTA and so was started on Unasyn for aspiration PNA.  He has since weaned off norepinephrine and was transferred out of ICU on 9/9.

## 2022-09-22 NOTE — Plan of Care (Signed)
  Problem: Education: Goal: Knowledge of General Education information will improve Description Including pain rating scale, medication(s)/side effects and non-pharmacologic comfort measures Outcome: Progressing   Problem: Health Behavior/Discharge Planning: Goal: Ability to manage health-related needs will improve Outcome: Progressing   

## 2022-09-22 NOTE — Procedures (Signed)
Modified Barium Swallow Study  Patient Details  Name: Derek Oconnell MRN: [id number] Date of Birth: 1955/04/15  Today's Date: 09/22/2022  Modified Barium Swallow completed.  Full report located under Chart Review in the Imaging Section.  History of Present Illness Pt is a 67 y.o. male who [facility] with AMS and seizure-like activity. Pt was sedated and intubated then transferred to [facility] for LTM. 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 NPO diet ordered by ST on 09/07. He has since weaned off norepinephrine and was transferred out of ICU on 9/9.  PMH is significant for HTN, CHF, CAD s/p stenting and PPM, COPD, EtOH dependence.   Clinical Impression Pt presents with an oral dysphagia primarily characterized by poor bolus awareness, control, holding, and mastication. No s/sx of aspiration or penetration were observed during this evaluation. Oral manipulation of bolus was prolonged and disorganized with all consistencies administered, but was most impaired with solid. Mastication was noticeably prolonged. Pt appeared distractable during mastication and required one verbal cue to redirect attention back to swallow during solid administration. One instance of a delayed cough with no asipiration or penetration present. Trace residue observed on both oral cavity and pharyngeal structures. Airway protection was overall functional. Swallow was initiated at the level of the vallecula and epiglottic inversion was complete. SLP reccommendation of Dys 1 (puree) and thin liquids with full supervsion to ensure pt is adequately attending to PO intakes. SLP will continue to follow for diet toleration and upgraded PO trials. Factors that may increase risk of adverse event in presence of aspiration Rubye Oaks & Clearance Coots 2021): Reduced cognitive function  Swallow Evaluation Recommendations Recommendations: PO diet PO Diet Recommendation: Dysphagia 1  (Pureed);Thin liquids (Level 0) Liquid Administration via: Cup;Straw;Spoon Medication Administration: Crushed with puree Supervision: Full supervision/cueing for swallowing strategies Swallowing strategies  : Slow rate;Small bites/sips Postural changes: Position pt fully upright for meals Oral care recommendations: Oral care BID (2x/day);Staff/trained caregiver to provide oral care      Marline Backbone, Senaida Lange., Speech Therapy Student

## 2022-09-22 NOTE — Progress Notes (Signed)
Arrived to patient's room to perform line care and found PICC line pulled out and laying beside patient in beds with gown/bedding all bloody. No longer bleeding from insertion site. However, cleaned and placed drsg to site. Notified nurse tech and patient's nurse. Tomasita Morrow, RN VAST

## 2022-09-22 NOTE — Progress Notes (Signed)
Pt. Pulled his PICC line.Bleeding stopped by pressing the site.MD. notified .

## 2022-09-22 NOTE — Progress Notes (Signed)
Inpatient Rehab Admissions Coordinator: '  I spoke with pt.'s wife (pt. Was out of room for a test) to discuss potential CIR admit. She states that she works from home and can provide 24/7 support and is interested in CIR for Pt. I will follow and pursue for admit.   Megan Salon, MS, CCC-SLP Rehab Admissions Coordinator  [phone] (celll) [phone] (office)

## 2022-09-22 NOTE — Progress Notes (Signed)
Physical Therapy Treatment Patient Details Name: Derek Oconnell MRN: [id number] DOB: 07-Nov-1955 Today's Date: 09/22/2022   History of Present Illness 67 y.o. male admitted 9/3 with unresponsive episode at home with seizure like activity. Intubated 9/3-9/5. 9/4 EEG with encephalopathy. 9/6 acute cortical infarcts R parietal lobe.  PMhx: HTN, HFrEF with EF 25-30%, CAD, COPD, HTN, biventricular ICD, heavy EtOH use    PT Comments  Pt greeted resting in bed, requiring encouragement for participation this date. Pt able to roll R/L with min A to complete and maintain sidelying during peri-care with cues to initiate and for sequencing. Pt declining EOB/OOB transfers this session despite max encouragement and education, however agreeable to bed level therex for strength and ROM maintenance. Pt continues to demonstrate some confusion with impaired cognition needing increased time to follow all commands and max cues to attend to and remain on task. Pt continues to benefit from skilled PT services to progress toward functional mobility goals.      If plan is discharge home, recommend the following: A lot of help with walking and/or transfers;A lot of help with bathing/dressing/bathroom;Assistance with cooking/housework;Direct supervision/assist for medications management;Assist for transportation;Supervision due to cognitive status;Assistance with feeding;Direct supervision/assist for financial management;Help with stairs or ramp for entrance   Can travel by private vehicle     Yes  Equipment Recommendations  Rolling walker (2 wheels);BSC/3in1;Wheelchair (measurements PT)    Recommendations for Other Services       Precautions / Restrictions Precautions Precautions: Fall;Other (comment) Precaution Comments: stool incontinence, cortrak Restrictions Weight Bearing Restrictions: No     Mobility  Bed Mobility Overal bed mobility: Needs Assistance Bed Mobility: Rolling Rolling: Min assist          General bed mobility comments: min A to roll R/L and maintain sidelying for peri-care    Transfers Overall transfer level: Needs assistance                 General transfer comment: pt declining transfer to EOB despite max encouragement    Ambulation/Gait                   Stairs             Wheelchair Mobility     Tilt Bed    Modified Rankin (Stroke Patients Only)       Balance Overall balance assessment: Needs assistance Sitting-balance support: Feet supported, No upper extremity supported Sitting balance-Leahy Scale: Fair     Standing balance support: Bilateral upper extremity supported, During functional activity, Reliant on assistive device for balance Standing balance-Leahy Scale: Poor                              Cognition Arousal: Alert Behavior During Therapy: Flat affect Overall Cognitive Status: Impaired/Different from baseline Area of Impairment: Memory, Following commands, Safety/judgement, Awareness, Problem solving                   Current Attention Level: Sustained Memory: Decreased recall of precautions, Decreased short-term memory Following Commands: Follows one step commands consistently Safety/Judgement: Decreased awareness of safety, Decreased awareness of deficits Awareness: Emergent Problem Solving: Slow processing, Decreased initiation, Difficulty sequencing, Requires verbal cues General Comments: flat affect, follows simple commands with increased time. Unaware that he had a CVA, limited insight        Exercises General Exercises - Lower Extremity Ankle Circles/Pumps: AROM, Both, 10 reps, Supine Hip ABduction/ADduction: AROM, Both, 10 reps, Supine  Straight Leg Raises: AROM, Both, 10 reps, Supine Hip Flexion/Marching: AROM, Both, 10 reps, Supine    General Comments General comments (skin integrity, edema, etc.): HR tachy up to 125bpm with bed mobility      Pertinent Vitals/Pain Pain  Assessment Pain Assessment: Faces Faces Pain Scale: Hurts a little bit Pain Location: generalized with rolling Pain Descriptors / Indicators: Discomfort Pain Intervention(s): Monitored during session, Limited activity within patient's tolerance    Home Living                          Prior Function            PT Goals (current goals can now be found in the care plan section) Acute Rehab PT Goals PT Goal Formulation: With patient Time For Goal Achievement: 10/02/22 Progress towards PT goals: Not progressing toward goals - comment (fatigue)    Frequency    Min 1X/week      PT Plan      Co-evaluation              AM-PAC PT "6 Clicks" Mobility   Outcome Measure  Help needed turning from your back to your side while in a flat bed without using bedrails?: A Little Help needed moving from lying on your back to sitting on the side of a flat bed without using bedrails?: A Little Help needed moving to and from a bed to a chair (including a wheelchair)?: A Lot Help needed standing up from a chair using your arms (e.g., wheelchair or bedside chair)?: A Little Help needed to walk in hospital room?: A Lot Help needed climbing 3-5 steps with a railing? : Total 6 Click Score: 14    End of Session   Activity Tolerance: Patient limited by fatigue Patient left: in bed;with call bell/phone within reach;with bed alarm set Nurse Communication: Mobility status PT Visit Diagnosis: Other abnormalities of gait and mobility (R26.89);Muscle weakness (generalized) (M62.81);Difficulty in walking, not elsewhere classified (R26.2)     Time: 1884-1660 PT Time Calculation (min) (ACUTE ONLY): 22 min  Charges:    $Therapeutic Exercise: 8-22 mins PT General Charges $$ ACUTE PT VISIT: 1 Visit                     Clyda Smyth R. PTA Acute Rehabilitation Services Office: [phone]   Catalina Antigua 09/22/2022, 4:29 PM

## 2022-09-22 NOTE — PMR Pre-admission (Signed)
PMR Admission Coordinator Pre-Admission Assessment  Patient: Derek Oconnell is an 67 y.o., male MRN: [id number] DOB: 11-13-1955 Height: 5\' 10"  (177.8 cm) Weight: 65.4 kg  Insurance Information HMO:     PPO:      PCP:      IPA:      80/20: yes     OTHER:  PRIMARY: Medicare Parts A and B       Policy#: [id number]  Subscriber:  Phone#: Verified online    Fax#:  Pre-Cert#:       Employer:  Benefits:  Phone #:      Name:  Eff. Date: Parts A  and B effective 07/12/21 Deduct: $1632      Out of Pocket Max:  None      Life Max: N/A  CIR: 100%      SNF: 100 days Outpatient: 80%     Co-Pay: 20% Home Health: 100%      Co-Pay: none DME: 80%     Co-Pay: 20% Providers: patient's choice SECONDARY: Tricare for Life      Policy#: [id number]      Phone#:   Financial Counselor:       Phone#:   The "Data Collection Information Summary" for patients in Inpatient Rehabilitation Facilities with attached "Privacy Act Statement-Health Care Records" was provided and verbally reviewed with: Patient  Emergency Contact Information Contact Information     Name Relation Home Work Mobile   [name],Derek Oconnell   [phone]      Other Contacts   None on File     Current Medical History  Patient Admitting Diagnosis: CVA,Encephalopathy, Alcohol Withdrawal History of Present Illness: Pt is a 67 year old male  with h/o HTN, CAD, pacemaker placement, COPD, and ETOH use d/o who presented to [facility] on 09/15/22 with AMS and seizure-like activity.  He was intubated and transferred to [facility] for LTM on 09/15/22.  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 CTA head & neck No LVO. 2D Echo 9/4: EF 60 to 65% ventricle with grade 2 diastolic dysfunction.  Per neurology, aspirin 81 mg daily and clopidogrel 75 mg daily prior to admission, now on aspirin 81 mg daily alone due to thrombocytopenia.Pt. also with alcohol withdrawal symptoms, placed on CIWA protocol on admission.  He  required pressors and developed low-grade fever with GGO in the RUL and CTA and so was started on Unasyn for aspiration PNA.  He has since weaned off norepinephrine and was transferred out of ICU on 9/9. Pt. With coretrak in place, SLP recommending puree diet, thin liquids. PT/OT/SLP saw Pt. And recommended CIR to assist return to PLOF.   Complete NIHSS TOTAL: 0  Patient's medical record from [facility]  has been reviewed by the rehabilitation admission coordinator and physician.  Past Medical History  Past Medical History:  Diagnosis Date   Myocardial infarction [facility]) 1989    Has the patient had major surgery during 100 days prior to admission? No  Family History   family history is not on file.  Current Medications  Current Facility-Administered Medications:    0.9 %  sodium chloride infusion, 250 mL, Intravenous, Continuous, Paliwal, Aditya, MD, Last Rate: 10 mL/hr at 09/22/22 0800, Infusion Verify at 09/22/22 0800   albuterol (PROVENTIL) (2.5 MG/3ML) 0.083% nebulizer solution 2.5 mg, 2.5 mg, Nebulization, Q3H PRN, Desai, Rahul P, PA-C   amoxicillin-clavulanate (AUGMENTIN) 875-125 MG per tablet 1 tablet, 1 tablet, Per Tube, Q12H, Celine Mans,  Rahul P, PA-C, 1 tablet at 09/22/22 0944   aspirin chewable tablet 81 mg, 81 mg, Per Tube, Daily, Tiffany Kocher, DO, 81 mg at 09/22/22 0944   atorvastatin (LIPITOR) tablet 80 mg, 80 mg, Per Tube, Daily, Tiffany Kocher, DO, 80 mg at 09/22/22 0944   Chlorhexidine Gluconate Cloth 2 % PADS 6 each, 6 each, Topical, Daily, Agarwala, Daleen Bo, MD, 6 each at 09/21/22 2000   docusate (COLACE) 50 MG/5ML liquid 100 mg, 100 mg, Per Tube, BID PRN, Hunsucker, Lesia Sago, MD   feeding supplement (PROSource TF20) liquid 60 mL, 60 mL, Per Tube, Daily, Agarwala, Daleen Bo, MD, 60 mL at 09/22/22 0944   feeding supplement (VITAL 1.5 CAL) liquid 1,000 mL, 1,000 mL, Per Tube, Continuous, Agarwala, Daleen Bo, MD, Last Rate: 50 mL/hr at 09/22/22 1403, 1,000 mL at  09/22/22 1403   folic acid (FOLVITE) tablet 1 mg, 1 mg, Per Tube, Daily, Agarwala, Daleen Bo, MD, 1 mg at 09/22/22 0944   Gerhardt's butt cream, , Topical, TID, Lynnell Catalan, MD, Given at 09/22/22 1200   guaiFENesin (ROBITUSSIN) 100 MG/5ML liquid 15 mL, 15 mL, Per Tube, Q6H PRN, Leslye Peer, MD, 15 mL at 09/20/22 1834   heparin injection 5,000 Units, 5,000 Units, Subcutaneous, Q8H, Desai, Rahul P, PA-C, 5,000 Units at 09/22/22 1421   mirtazapine (REMERON) tablet 30 mg, 30 mg, Per Tube, QHS, Bowser, Kaylyn Layer, NP, 30 mg at 09/21/22 2119   multivitamin with minerals tablet 1 tablet, 1 tablet, Per Tube, Daily, Lynnell Catalan, MD, 1 tablet at 09/22/22 0944   nutrition supplement (JUVEN) (JUVEN) powder packet 1 packet, 1 packet, Per Tube, BID BM, Lynnell Catalan, MD, 1 packet at 09/22/22 1421   Oral care mouth rinse, 15 mL, Mouth Rinse, 4 times per day, Lynnell Catalan, MD, 15 mL at 09/22/22 1200   Oral care mouth rinse, 15 mL, Mouth Rinse, PRN, Agarwala, Ravi, MD   pantoprazole (PROTONIX) injection 40 mg, 40 mg, Intravenous, QHS, Desai, Rahul P, PA-C, 40 mg at 09/21/22 2113   polyethylene glycol (MIRALAX / GLYCOLAX) packet 17 g, 17 g, Per Tube, Daily PRN, Desai, Rahul P, PA-C   sodium chloride flush (NS) 0.9 % injection 10-40 mL, 10-40 mL, Intracatheter, Q12H, Byrum, Les Pou, MD, 10 mL at 09/21/22 2116   sodium chloride flush (NS) 0.9 % injection 10-40 mL, 10-40 mL, Intracatheter, PRN, Leslye Peer, MD   thiamine (VITAMIN B1) tablet 100 mg, 100 mg, Per Tube, Daily, Bell, Lorin C, RPH, 100 mg at 09/22/22 0944  Patients Current Diet:  Diet Order             DIET - DYS 1 Room service appropriate? Yes; Fluid consistency: Thin  Diet effective now                   Precautions / Restrictions Precautions Precautions: Fall, Other (comment) Precaution Comments: stool incontinence, cortrak Restrictions Weight Bearing Restrictions: No   Has the patient had 2 or more falls or a fall with injury  in the past year? No  Prior Activity Level Limited Community (1-2x/wk): Pt. went out once a week usually  Prior Functional Level Self Care: Did the patient need help bathing, dressing, using the toilet or eating? Independent  Indoor Mobility: Did the patient need assistance with walking from room to room (with or without device)? Independent  Stairs: Did the patient need assistance with internal or external stairs (with or without device)? Independent  Functional Cognition: Did the patient need help planning regular tasks such  as shopping or remembering to take medications? Needed some help  Patient Information Are you of Hispanic, Latino/a,or Spanish origin?: A. No, not of Hispanic, Latino/a, or Spanish origin What is your race?: A. White Do you need or want an interpreter to communicate with a doctor or health care staff?: 0. No  Patient's Response To:  Health Literacy and Transportation Is the patient able to respond to health literacy and transportation needs?: Yes Health Literacy - How often do you need to have someone help you when you read instructions, pamphlets, or other written material from your doctor or pharmacy?: Never In the past 12 months, has lack of transportation kept you from medical appointments or from getting medications?: No In the past 12 months, has lack of transportation kept you from meetings, work, or from getting things needed for daily living?: No  Home Assistive Devices / Equipment Home Equipment: None  Prior Device Use: Indicate devices/aids used by the patient prior to current illness, exacerbation or injury? Walker  Current Functional Level Cognition  Arousal/Alertness: Awake/alert Overall Cognitive Status: Impaired/Different from baseline Current Attention Level: Sustained Orientation Level: Oriented X4 Following Commands: Follows one step commands consistently Safety/Judgement: Decreased awareness of safety, Decreased awareness of  deficits General Comments: flat affect, follows simple commands with increased time. Did not recall getting OOB 2 days ago (with PT). Unaware that he had a CVA, limited insight Attention: Sustained Sustained Attention: Impaired Sustained Attention Impairment: Verbal basic Memory: Impaired Memory Impairment: Storage deficit Awareness: Impaired Awareness Impairment: Intellectual impairment Safety/Judgment: Impaired    Extremity Assessment (includes Sensation/Coordination)  Upper Extremity Assessment: RUE deficits/detail, LUE deficits/detail RUE Deficits / Details: ROM is WFL, generalized weakness RUE Sensation: WNL RUE Coordination: WNL LUE Deficits / Details: globally 4/5; denies paraesthesias. poor coordination LUE Sensation: WNL LUE Coordination: decreased fine motor  Lower Extremity Assessment: Defer to PT evaluation    ADLs  Overall ADL's : Needs assistance/impaired Eating/Feeding: NPO Eating/Feeding Details (indicate cue type and reason): cortrak Grooming: Set up, Sitting Upper Body Bathing: Set up, Sitting Lower Body Bathing: Maximal assistance, Sit to/from stand Upper Body Dressing : Set up, Sitting Lower Body Dressing: Maximal assistance, Sit to/from stand Toilet Transfer: Minimal assistance, Stand-pivot, Rolling walker (2 wheels), BSC/3in1 Toileting- Clothing Manipulation and Hygiene: Maximal assistance, Sit to/from stand Toileting - Clothing Manipulation Details (indicate cue type and reason): requires BUE support Functional mobility during ADLs: Minimal assistance, Rolling walker (2 wheels) General ADL Comments: poor activity tolerance, weakness, poor insight, incontinent stool    Mobility  Overal bed mobility: Needs Assistance Bed Mobility: Supine to Sit, Sit to Supine Supine to sit: Mod assist Sit to supine: Mod assist General bed mobility comments: HOB 20 degrees, increased time, physical assist to initiate moving legs to EOB and to elevate trunk. Mod cues for  transition    Transfers  Overall transfer level: Needs assistance Equipment used: Rolling walker (2 wheels) Transfers: Sit to/from Stand Sit to Stand: Min assist General transfer comment: prolonged standing for pericare and linen change; min A for balance and min A fro side stepping at EOB    Ambulation / Gait / Stairs / Wheelchair Mobility  Ambulation/Gait Ambulation/Gait assistance: Min assist, +2 safety/equipment Gait Distance (Feet): 5 Feet Assistive device: Rolling walker (2 wheels) Gait Pattern/deviations: Step-to pattern, Trunk flexed, Narrow base of support General Gait Details: pt with narrow BOS, short steps and flexed trunk with HR 150 with limited gait and need for chair follow due to fatigue. heavy reliance on RW Gait velocity interpretation: <  1.8 ft/sec, indicate of risk for recurrent falls    Posture / Balance Balance Overall balance assessment: Needs assistance Sitting-balance support: Feet supported, No upper extremity supported Sitting balance-Leahy Scale: Fair Standing balance support: Bilateral upper extremity supported, During functional activity, Reliant on assistive device for balance Standing balance-Leahy Scale: Poor    Special needs/care consideration Special service needs coretrak   Previous Home Environment (from acute therapy documentation) Living Arrangements: Oconnell/significant other  Lives With: Oconnell Available Help at Discharge: Family, Available 24 hours/day Type of Home: House Home Layout: Multi-level, Bed/bath upstairs Alternate Level Stairs-Number of Steps: flight Home Access: Stairs to enter Secretary/administrator of Steps: 7 Bathroom Shower/Tub: Engineer, manufacturing systems: Standard Bathroom Accessibility: Yes How Accessible: Accessible via walker Home Care Services: Yes  Discharge Living Setting Plans for Discharge Living Setting: Patient's home Type of Home at Discharge: House Discharge Home Layout: Multi-level, Able to live  on main level with bedroom/bathroom Alternate Level Stairs-Rails: Right, Left Alternate Level Stairs-Number of Steps: flight Discharge Home Access: Stairs to enter Entrance Stairs-Rails: Right Entrance Stairs-Number of Steps: 4 Discharge Bathroom Shower/Tub: Walk-in shower, Tub/shower unit Discharge Bathroom Toilet: Standard Discharge Bathroom Accessibility: Yes How Accessible: Accessible via walker  Social/Family/Support Systems Patient Roles: Oconnell Contact Information: [phone] Anticipated Caregiver: 24/7 Anticipated Caregiver's Contact Information: Wife [facility] and can provide 24/7 Supervision to min A Caregiver Availability: 24/7 Discharge Plan Discussed with Primary Caregiver: Yes Is Caregiver In Agreement with Plan?: Yes Does Caregiver/Family have Issues with Lodging/Transportation while Pt is in Rehab?: No  Goals Patient/Family Goal for Rehab: PT/OT/SLP supervision Expected length of stay: 7-10 days Pt/Family Agrees to Admission and willing to participate: Yes Program Orientation Provided & Reviewed with Pt/Caregiver Including Roles  & Responsibilities: Yes  Decrease burden of Care through IP rehab admission: not anticipated  Possible need for SNF placement upon discharge: not anticipated  Patient Condition: I have reviewed medical records from [facility] , spoken with CM, and patient. I met with patient at the bedside for inpatient rehabilitation assessment.  Patient will benefit from ongoing PT, OT, and SLP, can actively participate in 3 hours of therapy a day 5 days of the week, and can make measurable gains during the admission.  Patient will also benefit from the coordinated team approach during an Inpatient Acute Rehabilitation admission.  The patient will receive intensive therapy as well as Rehabilitation physician, nursing, social worker, and care management interventions.  Due to safety, skin/wound care, disease management, medication administration,  pain management, and patient education the patient requires 24 hour a day rehabilitation nursing.  The patient is currently *** with mobility and basic ADLs.  Discharge setting and therapy post discharge at home with home health is anticipated.  Patient has agreed to participate in the Acute Inpatient Rehabilitation Program and will admit {Time; today/tomorrow:10263}.  Preadmission Screen Completed By:  Jeronimo Greaves, 09/22/2022 3:25 PM ______________________________________________________________________   Discussed status with Dr. Marland Kitchen on *** at *** and received approval for admission today.  Admission Coordinator:  Jeronimo Greaves, CCC-SLP, time Marland KitchenDorna Bloom ***   Assessment/Plan: Diagnosis: Does the need for close, 24 hr/day Medical supervision in concert with the patient's rehab needs make it unreasonable for this patient to be served in a less intensive setting? {yes_no_potentially:3041433} Co-Morbidities requiring supervision/potential complications: *** Due to {due WU:1324401}, does the patient require 24 hr/day rehab nursing? {yes_no_potentially:3041433} Does the patient require coordinated care of a physician, rehab nurse, PT, OT, and SLP to address physical and functional deficits in the  context of the above medical diagnosis(es)? {yes_no_potentially:3041433} Addressing deficits in the following areas: {deficits:3041436} Can the patient actively participate in an intensive therapy program of at least 3 hrs of therapy 5 days a week? {yes_no_potentially:3041433} The potential for patient to make measurable gains while on inpatient rehab is {potential:3041437} Anticipated functional outcomes upon discharge from inpatient rehab: {functional outcomes:304600100} PT, {functional outcomes:304600100} OT, {functional outcomes:304600100} SLP Estimated rehab length of stay to reach the above functional goals is: *** Anticipated discharge destination: {anticipated dc setting:21604} 10. Overall  Rehab/Functional Prognosis: {potential:3041437}   MD Signature: ***

## 2022-09-23 DIAGNOSIS — J690 Pneumonitis due to inhalation of food and vomit: Secondary | ICD-10-CM

## 2022-09-23 DIAGNOSIS — G9340 Encephalopathy, unspecified: Secondary | ICD-10-CM | POA: Diagnosis not present

## 2022-09-23 DIAGNOSIS — I639 Cerebral infarction, unspecified: Secondary | ICD-10-CM

## 2022-09-23 DIAGNOSIS — G40901 Epilepsy, unspecified, not intractable, with status epilepticus: Secondary | ICD-10-CM | POA: Diagnosis not present

## 2022-09-23 LAB — CBC WITH DIFFERENTIAL/PLATELET
Abs Immature Granulocytes: 0.04 10*3/uL (ref 0.00–0.07)
Basophils Absolute: 0 10*3/uL (ref 0.0–0.1)
Basophils Relative: 0 %
Eosinophils Absolute: 0.4 10*3/uL (ref 0.0–0.5)
Eosinophils Relative: 6 %
HCT: 21.1 % — ABNORMAL LOW (ref 39.0–52.0)
Hemoglobin: 7 g/dL — ABNORMAL LOW (ref 13.0–17.0)
Immature Granulocytes: 1 %
Lymphocytes Relative: 9 %
Lymphs Abs: 0.7 10*3/uL (ref 0.7–4.0)
MCH: 34.5 pg — ABNORMAL HIGH (ref 26.0–34.0)
MCHC: 33.2 g/dL (ref 30.0–36.0)
MCV: 103.9 fL — ABNORMAL HIGH (ref 80.0–100.0)
Monocytes Absolute: 0.6 10*3/uL (ref 0.1–1.0)
Monocytes Relative: 8 %
Neutro Abs: 5.8 10*3/uL (ref 1.7–7.7)
Neutrophils Relative %: 76 %
Platelets: 190 10*3/uL (ref 150–400)
RBC: 2.03 MIL/uL — ABNORMAL LOW (ref 4.22–5.81)
RDW: 18.1 % — ABNORMAL HIGH (ref 11.5–15.5)
WBC: 7.6 10*3/uL (ref 4.0–10.5)
nRBC: 0 % (ref 0.0–0.2)

## 2022-09-23 LAB — BASIC METABOLIC PANEL
Anion gap: 9 (ref 5–15)
BUN: 43 mg/dL — ABNORMAL HIGH (ref 8–23)
CO2: 21 mmol/L — ABNORMAL LOW (ref 22–32)
Calcium: 8.2 mg/dL — ABNORMAL LOW (ref 8.9–10.3)
Chloride: 110 mmol/L (ref 98–111)
Creatinine, Ser: 1.31 mg/dL — ABNORMAL HIGH (ref 0.61–1.24)
GFR, Estimated: >60 mL/min (ref >60)
Glucose, Bld: 129 mg/dL — ABNORMAL HIGH (ref 70–99)
Potassium: 4.7 mmol/L (ref 3.5–5.1)
Sodium: 140 mmol/L (ref 135–145)

## 2022-09-23 LAB — GLUCOSE, CAPILLARY
Glucose-Capillary: 135 mg/dL — ABNORMAL HIGH (ref 70–99)
Glucose-Capillary: 108 mg/dL — ABNORMAL HIGH (ref 70–99)
Glucose-Capillary: 138 mg/dL — ABNORMAL HIGH (ref 70–99)
Glucose-Capillary: 117 mg/dL — ABNORMAL HIGH (ref 70–99)

## 2022-09-23 LAB — MAGNESIUM: Magnesium: 1.7 mg/dL (ref 1.7–2.4)

## 2022-09-23 MED ORDER — LACTATED RINGERS IV BOLUS
500.0000 mL | Freq: Once | INTRAVENOUS | Status: AC
  Administered 2022-09-23: 500 mL via INTRAVENOUS

## 2022-09-23 NOTE — Progress Notes (Signed)
Physical Therapy Treatment Patient Details Name: Derek Oconnell MRN: [id number] DOB: [birth year] Today's Date: 09/23/2022   History of Present Illness 67 y.o. male admitted 9/3 with unresponsive episode at home with seizure like activity. Intubated 9/3-9/5. 9/4 EEG with encephalopathy. 9/6 acute cortical infarcts R parietal lobe.  PMhx: HTN, HFrEF with EF 25-30%, CAD, COPD, HTN, biventricular ICD, heavy EtOH use    PT Comments  Pt received in supine after recently transferring back to bed with nursing staff assist, pt agreeable to therapy session with emphasis and supine/standing exercises and transfer training. Pt with elevated HR up to 143 bpm with exertion but able to perform forward/backward and lateral steps at EOB to simulate step pivot transfer. Pt needing up to minA for sit<>stand and up to light modA for bed mobility and step pivot due to need for RW assist when stepping. Defer gait trial away from bedside due to tachycardia with standing exercises/side steps. SpO2 WFL on RA when hand at rest (noisy signal with transfers). Pt continues to benefit from PT services to progress toward functional mobility goals.     If plan is discharge home, recommend the following: A lot of help with walking and/or transfers;A lot of help with bathing/dressing/bathroom;Assistance with cooking/housework;Direct supervision/assist for medications management;Assist for transportation;Supervision due to cognitive status;Assistance with feeding;Direct supervision/assist for financial management;Help with stairs or ramp for entrance   Can travel by private vehicle     Yes  Equipment Recommendations  Rolling walker (2 wheels);BSC/3in1;Wheelchair (measurements PT)    Recommendations for Other Services       Precautions / Restrictions Precautions Precautions: Fall;Other (comment) Precaution Comments: cortrak, watch HR Restrictions Weight Bearing Restrictions: No     Mobility  Bed Mobility Overal bed  mobility: Needs Assistance Bed Mobility: Rolling, Sidelying to Sit, Sit to Sidelying Rolling: Min assist Sidelying to sit: Mod assist, Used rails, HOB elevated     Sit to sidelying: Min assist, HOB elevated, Used rails General bed mobility comments: to R EOB, increased time to initiate/perform and mod cues for sequencing.    Transfers Overall transfer level: Needs assistance Equipment used: Rolling walker (2 wheels) Transfers: Sit to/from Stand, Bed to chair/wheelchair/BSC Sit to Stand: Min assist, From elevated surface   Step pivot transfers: Mod assist, From elevated surface       General transfer comment: sidesteps along EOB and forward/backward to simulate step pivot transfer; per RN he had just gotten back to bed from the chair and cleaned up so pt defers OOB to chair.    Ambulation/Gait Ambulation/Gait assistance: Min assist Gait Distance (Feet): 4 Feet Assistive device: Rolling walker (2 wheels) Gait Pattern/deviations: Step-to pattern, Trunk flexed, Narrow base of support     Pre-gait activities: standing hip flexion x10 reps ea with high knees at RW General Gait Details: HR 143 bpm with dynamic standing tasks so defer longer distance, pt also c/o fatigue after recently getting up ~10 mins prior to NT/RN assist for toileting.   Stairs             Wheelchair Mobility     Tilt Bed    Modified Rankin (Stroke Patients Only)       Balance Overall balance assessment: Needs assistance Sitting-balance support: Feet supported, No upper extremity supported Sitting balance-Leahy Scale: Fair     Standing balance support: Bilateral upper extremity supported, During functional activity, Reliant on assistive device for balance Standing balance-Leahy Scale: Poor  Cognition Arousal: Alert Behavior During Therapy: WFL for tasks assessed/performed Overall Cognitive Status: Impaired/Different from baseline Area of  Impairment: Memory, Following commands, Safety/judgement, Awareness, Problem solving                   Current Attention Level: Sustained Memory: Decreased recall of precautions, Decreased short-term memory Following Commands: Follows one step commands consistently Safety/Judgement: Decreased awareness of safety, Decreased awareness of deficits Awareness: Emergent Problem Solving: Slow processing, Difficulty sequencing, Requires verbal cues General Comments: Good participation this date, pt with improving awareness of deficits but needs cues for line awareness and safety.        Exercises General Exercises - Lower Extremity Ankle Circles/Pumps: AROM, Both, 10 reps, Supine Short Arc Quad: AROM, Both, 10 reps, Supine Hip ABduction/ADduction: AROM, Both, 10 reps, Supine Hip Flexion/Marching: AROM, Both, 10 reps, Standing Heel Raises: AROM, Both, 10 reps, Standing    General Comments        Pertinent Vitals/Pain Pain Assessment Pain Assessment: Faces Faces Pain Scale: Hurts a little bit Pain Location: generalized with movement Pain Descriptors / Indicators: Discomfort, Grimacing Pain Intervention(s): Monitored during session, Repositioned     PT Goals (current goals can now be found in the care plan section) Acute Rehab PT Goals Patient Stated Goal: return home PT Goal Formulation: With patient Time For Goal Achievement: 10/02/22 Progress towards PT goals: Progressing toward goals    Frequency    Min 1X/week      PT Plan         AM-PAC PT "6 Clicks" Mobility   Outcome Measure  Help needed turning from your back to your side while in a flat bed without using bedrails?: A Little Help needed moving from lying on your back to sitting on the side of a flat bed without using bedrails?: A Lot (from flat bed) Help needed moving to and from a bed to a chair (including a wheelchair)?: A Lot Help needed standing up from a chair using your arms (e.g., wheelchair or  bedside chair)?: A Little Help needed to walk in hospital room?: Total (<29ft) Help needed climbing 3-5 steps with a railing? : Total 6 Click Score: 12    End of Session Equipment Utilized During Treatment: Gait belt Activity Tolerance: Patient tolerated treatment well;Other (comment) (HR elevated) Patient left: in bed;with call bell/phone within reach;with bed alarm set;Other (comment) (heels floated, HOB locked >30* given cortrak running) Nurse Communication: Mobility status;Other (comment) (pt coughing/sneezing often, may need double checked given cortrak; tachy) PT Visit Diagnosis: Other abnormalities of gait and mobility (R26.89);Muscle weakness (generalized) (M62.81);Difficulty in walking, not elsewhere classified (R26.2)     Time: 1610-9604 PT Time Calculation (min) (ACUTE ONLY): 18 min  Charges:    $Therapeutic Exercise: 8-22 mins PT General Charges $$ ACUTE PT VISIT: 1 Visit                     Derek Oconnell P., PTA Acute Rehabilitation Services Secure Chat Preferred 9a-5:30pm Office: [phone]    Dorathy Kinsman [facility] 09/23/2022, 7:11 PM

## 2022-09-23 NOTE — Progress Notes (Signed)
Speech Language Pathology Treatment: Dysphagia  Patient Details Name: Derek Oconnell MRN: [id number] DOB: [birth year] Today's Date: 09/23/2022 Time: 8295-6213 SLP Time Calculation (min) (ACUTE ONLY): 25 min  Assessment / Plan / Recommendation Clinical Impression  Pt was seen by SLP for diet toleration check. Pt's wife was in the room during this tx and provided SLP with pt's h/o of eating issues. He has had a limited appetite for the past several years that "ebbs and flows." She stated he often does not eat more than once a day. Pt has been on an appetite stimulant. Pt's wife requested upgraded PO trials in an attempt to stimulate his appetite. Puree (pudding) was administered to check current diet toleration. Pt had improved attention to PO intake as compared to 09/10. Prolonged mastication continues to be observed, but has improved. SLP recommending upgrade to dys 2 (minced) and thin liquids with full supervision to ensure pt remains attentive when eating. SLP will f/u for upgraded diet meal tray PO trial.   HPI HPI: Pt is a 67 y.o. male who presented to [facility] on 09/15/22 with AMS and seizure-like activity. Pt was sedated and intubated then transferred to [facility] for LTM. 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 He has since weaned off norepinephrine and was transferred out of ICU on 9/9. ST ordered dys 1, thin on 09/10. PMH is significant for HTN, CHF, CAD s/p stenting and PPM, COPD, EtOH dependence.      SLP Plan  Continue with current plan of care      Recommendations for follow up therapy are one component of a multi-disciplinary discharge planning process, led by the attending physician.  Recommendations may be updated based on patient status, additional functional criteria and insurance authorization.    Recommendations  Diet recommendations: Dysphagia 2 (fine chop);Thin liquid Liquids provided via: Cup;Straw Medication Administration:  Crushed with puree Supervision: Staff to assist with self feeding;Full supervision/cueing for compensatory strategies Compensations: Minimize environmental distractions;Slow rate;Small sips/bites Postural Changes and/or Swallow Maneuvers: Seated upright 90 degrees                  Oral care BID;Staff/trained caregiver to provide oral care   Frequent or constant Supervision/Assistance Dysphagia, oral phase (R13.11)     Continue with current plan of care     Marline Backbone, B.S., Speech Therapy Student

## 2022-09-23 NOTE — Plan of Care (Signed)
Received care of patient via Transfer from 8med/surg. Pt alert and oriented x4, denies any c/o pain or discomfort. VSS, afebrile. Biventricular pacer intact in sync with monitor. NGT remains patent and intact, feeds infusing as ordered. Condom cath placed on patient. Safety protocols in place I.e. bed alarm on, frequent checks. No s/s of distress will continue to monitor.

## 2022-09-23 NOTE — Progress Notes (Signed)
PROGRESS NOTE        PATIENT DETAILS Name: Richey Soriano Age: 67 y.o. Sex: male Date of Birth: [birth year] Admit Date: 09/15/2022 Admitting Physician Lorin Glass, MD MVH:QIONG, Josephine Igo, MD  Brief Summary: Patient is a 67 y.o.  male with history of EtOH use, chronic HFrEF with recovered EF, CAD/PAD who presented to [facility] with AMS/seizure-like activity-he was intubated-and transferred to [facility] ICU for LTM.  He was stabilized-underwent neurology evaluation including LTM EEG-he was subsequently extubated and transferred to [facility] on 9/10.  Significant events: 9/3>> admitted and intubated at outside hospital  9/5>> extubated 9/6 MRI brain w acute cortical infarcts R parietal lobe 9/7>> norepinephrine-low-grade fever-ground glass opacity on CT chest-started Unasyn. 9/10>>Transfer to [facility]  Significant studies: 9/3>> CT head: No acute intracranial abnormality 9/3>> CT C-spine: No fracture/subluxation 9/3-9/4>> LTM EEG: No seizures 9/4>> Echo: EF 60-65% 9/6>> MRI brain: Acute cortical infarcts right parietal lobe 9/7>> CTA head/neck: No LVO-no significant stenosis in neck/intracranially 9/7>> A1c: 5.1 9/8>> LDL: 47  Significant microbiology data: 9/7>> blood culture: Negative 9/7>> sputum culture: Staph lugdunensis  Procedures: 9/3-9/5>> ETT 9/4>>Cortrak  Consults: None  Subjective: Lying comfortably in bed-denies any chest pain or shortness of breath.  Looks frail-chronically sick appearing.  Objective: Vitals: Blood pressure 117/71, pulse 100, temperature 97.9 F (36.6 C), temperature source Oral, resp. rate 19, height 5\' 10"  (1.778 m), weight 59.6 kg, SpO2 96%.   Exam: Gen Exam:Alert awake-not in any distress HEENT:atraumatic, normocephalic Chest: B/L clear to auscultation anteriorly CVS:S1S2 regular Abdomen:soft non tender, non distended Extremities:no edema Neurology: Non focal Skin: no rash  Pertinent Labs/Radiology:    Latest Ref Rng &  Units 09/23/2022    6:47 AM 09/22/2022    3:21 AM 09/19/2022    1:51 PM  CBC  WBC 4.0 - 10.5 K/uL 7.6  8.2  5.7   Hemoglobin 13.0 - 17.0 g/dL 7.0  7.3  9.4   Hematocrit 39.0 - 52.0 % 21.1  22.5  28.9   Platelets 150 - 400 K/uL 190  167  110     Lab Results  Component Value Date   NA 140 09/23/2022   K 4.7 09/23/2022   CL 110 09/23/2022   CO2 21 (L) 09/23/2022      Assessment/Plan: Acute metabolic encephalopathy Alcohol withdrawal seizures EtOH abuse Mentation has improved-completely awake and alert Seizures in the setting of alcohol use-no AED recommended LTM/neuroimaging as above  Septic shock with acute hypoxic respiratory failure due to aspiration pneumonia Sepsis physiology and hypoxia have resolved Sputum culture positive for staph lugdunensis-likely a contamination but already on Augmentin.  Acute CVA Workup as above Per neurology note-ICD interrogation negative for A-fib Neurology recommending only aspirin for stroke-due to thrombocytopenia/fall risk  History of chronic HFrEF with recovered EF-s/p BiV ICD April 2024 PAD-s/p stent/aortofemoral bypass CAD s/p PCI to LM/LAD July 2023 Euvolemic on exam No anginal symptoms Continue aspirin/statin Beta-blocker/lisinopril held due to soft blood pressure  AKI Likely hemodynamically  mediated AKI  Mild Supportive care Repeat electrolytes in a.m.  HTN BP stable Lisinopril/metoprolol held for now  HLD Statin  Macrocytic anemia Likely combination of EtOH use/critical illness Anemia panel with a.m. labs Repeat CBC in a.m. Transfuse PRBC if significant drop  Thrombocytopenia Secondary to sepsis Resolved Follow CBC periodically  Oropharyngeal dysphagia Secondary to acute illness/debility/deconditioning Change to nocturnal feeds Encourage oral intake-SLP following-SLP  1 diet.  Debility/deconditioning Secondary to critical illness PT/OT-CIR planned.  Nutrition Status: Nutrition Problem: Severe  Malnutrition Etiology: social / environmental circumstances Signs/Symptoms: severe fat depletion, severe muscle depletion Interventions: Prostat, MVI, Juven, Tube feeding    Pressure Ulcer: Pressure Injury 09/15/22 Vertebral column Mid Stage 1 -  Intact skin with non-blanchable redness of a localized area usually over a bony prominence. stage 1 mid back (Active)  09/15/22 2130  Location: Vertebral column  Location Orientation: Mid  Staging: Stage 1 -  Intact skin with non-blanchable redness of a localized area usually over a bony prominence.  Wound Description (Comments): stage 1 mid back  Present on Admission: Yes  Dressing Type Foam - Lift dressing to assess site every shift 09/23/22 0400     Pressure Injury 09/15/22 Buttocks Right;Left;Lower Deep Tissue Pressure Injury - Purple or maroon localized area of discolored intact skin or blood-filled blister due to damage of underlying soft tissue from pressure and/or shear. DTI sacrum (Active)  09/15/22 2130  Location: Buttocks  Location Orientation: Right;Left;Lower  Staging: Deep Tissue Pressure Injury - Purple or maroon localized area of discolored intact skin or blood-filled blister due to damage of underlying soft tissue from pressure and/or shear.  Wound Description (Comments): DTI sacrum  Present on Admission: Yes  Dressing Type None 09/23/22 0400    Underweight: Estimated body mass index is 18.85 kg/m as calculated from the following:   Height as of this encounter: 5\' 10"  (1.778 m).   Weight as of this encounter: 59.6 kg.   Code status:   Code Status: Full Code   DVT Prophylaxis: heparin injection 5,000 Units Start: 09/15/22 2215   Family Communication: None at bedside   Disposition Plan: Status is: Inpatient Remains inpatient appropriate because: Severity of illness  Planned Discharge Destination: CIR   Diet: Diet Order             DIET - DYS 1 Room service appropriate? Yes; Fluid consistency: Thin  Diet  effective now                     Antimicrobial agents: Anti-infectives (From admission, onward)    Start     Dose/Rate Route Frequency Ordered Stop   09/21/22 0915  amoxicillin-clavulanate (AUGMENTIN) 875-125 MG per tablet 1 tablet        1 tablet Per Tube Every 12 hours 09/21/22 0815 09/24/22 2359   09/19/22 1930  Ampicillin-Sulbactam (UNASYN) 3 g in sodium chloride 0.9 % 100 mL IVPB  Status:  Discontinued        3 g 200 mL/hr over 30 Minutes Intravenous Every 6 hours 09/19/22 1842 09/21/22 0815        MEDICATIONS: Scheduled Meds:  amoxicillin-clavulanate  1 tablet Per Tube Q12H   aspirin  81 mg Per Tube Daily   atorvastatin  80 mg Per Tube Daily   Chlorhexidine Gluconate Cloth  6 each Topical Daily   feeding supplement (PROSource TF20)  60 mL Per Tube Daily   folic acid  1 mg Per Tube Daily   Gerhardt's butt cream   Topical TID   heparin  5,000 Units Subcutaneous Q8H   mirtazapine  30 mg Per Tube QHS   multivitamin with minerals  1 tablet Per Tube Daily   nutrition supplement (JUVEN)  1 packet Per Tube BID BM   mouth rinse  15 mL Mouth Rinse 4 times per day   pantoprazole (PROTONIX) IV  40 mg Intravenous QHS   sodium chloride flush  10-40 mL Intracatheter Q12H   thiamine  100 mg Per Tube Daily   Continuous Infusions:  sodium chloride 250 mL (09/23/22 0540)   feeding supplement (VITAL 1.5 CAL) 50 mL/hr at 09/23/22 0453   PRN Meds:.albuterol, docusate, guaiFENesin, mouth rinse, polyethylene glycol, sodium chloride flush   I have personally reviewed following labs and imaging studies  LABORATORY DATA: CBC: Recent Labs  Lab 09/17/22 0558 09/18/22 0750 09/19/22 1351 09/22/22 0321 09/23/22 0647  WBC 5.8 4.3 5.7 8.2 7.6  NEUTROABS  --   --   --   --  5.8  HGB 8.1* 8.8* 9.4* 7.3* 7.0*  HCT 24.9* 27.1* 28.9* 22.5* 21.1*  MCV 102.5* 106.7* 105.1* 103.7* 103.9*  PLT 95* 99* 110* 167 190    Basic Metabolic Panel: Recent Labs  Lab 09/20/22 0841  09/20/22 1758 09/21/22 0225 09/21/22 1652 09/22/22 0321 09/22/22 0604 09/23/22 0647  NA 139 140 140 141 140  --  140  K 4.2 4.8 4.3 4.9 4.3  --  4.7  CL 108 107 108 106 106  --  110  CO2 23 23 21* 23 23  --  21*  GLUCOSE 120* 126* 133* 138* 231*  --  129*  BUN 31* 41* 41* 50* 46*  --  43*  CREATININE 1.15 1.13 1.22 1.30* 1.30*  --  1.31*  CALCIUM 7.4* 7.4* 7.3* 8.2* 7.5*  --  8.2*  MG 1.8 2.1 1.9 2.2 2.0  --  1.7  PHOS 1.9* 4.8* 3.6 2.0*  --  6.6*  --     GFR: Estimated Creatinine Clearance: 46.8 mL/min (A) (by C-G formula based on SCr of 1.31 mg/dL (H)).  Liver Function Tests: Recent Labs  Lab 09/19/22 1351  AST 19  ALT 11  ALKPHOS 79  BILITOT 0.7  PROT 5.5*  ALBUMIN 2.0*   No results for input(s): "LIPASE", "AMYLASE" in the last 168 hours. Recent Labs  Lab 09/19/22 1351  AMMONIA 35    Coagulation Profile: No results for input(s): "INR", "PROTIME" in the last 168 hours.  Cardiac Enzymes: No results for input(s): "CKTOTAL", "CKMB", "CKMBINDEX", "TROPONINI" in the last 168 hours.  BNP (last 3 results) No results for input(s): "PROBNP" in the last 8760 hours.  Lipid Profile: No results for input(s): "CHOL", "HDL", "LDLCALC", "TRIG", "CHOLHDL", "LDLDIRECT" in the last 72 hours.  Thyroid Function Tests: No results for input(s): "TSH", "T4TOTAL", "FREET4", "T3FREE", "THYROIDAB" in the last 72 hours.  Anemia Panel: No results for input(s): "VITAMINB12", "FOLATE", "FERRITIN", "TIBC", "IRON", "RETICCTPCT" in the last 72 hours.  Urine analysis:    Component Value Date/Time   COLORURINE STRAW (A) 09/19/2022 2119   APPEARANCEUR CLEAR 09/19/2022 2119   LABSPEC 1.017 09/19/2022 2119   PHURINE 7.0 09/19/2022 2119   GLUCOSEU NEGATIVE 09/19/2022 2119   HGBUR NEGATIVE 09/19/2022 2119   BILIRUBINUR NEGATIVE 09/19/2022 2119   KETONESUR NEGATIVE 09/19/2022 2119   PROTEINUR NEGATIVE 09/19/2022 2119   NITRITE NEGATIVE 09/19/2022 2119   LEUKOCYTESUR NEGATIVE 09/19/2022  2119    Sepsis Labs: Lactic Acid, Venous    Component Value Date/Time   LATICACIDVEN 2.3 (HH) 09/19/2022 1913    MICROBIOLOGY: Recent Results (from the past 240 hour(s))  Resp panel by RT-PCR (RSV, Flu A&B, Covid) Anterior Nasal Swab     Status: None   Collection Time: 09/15/22  3:00 PM   Specimen: Anterior Nasal Swab  Result Value Ref Range Status   SARS Coronavirus 2 by RT PCR NEGATIVE NEGATIVE Final    Comment: (NOTE) SARS-CoV-2  target nucleic acids are NOT DETECTED.  The SARS-CoV-2 RNA is generally detectable in upper respiratory specimens during the acute phase of infection. The lowest concentration of SARS-CoV-2 viral copies this assay can detect is 138 copies/mL. A negative result does not preclude SARS-Cov-2 infection and should not be used as the sole basis for treatment or other patient management decisions. A negative result may occur with  improper specimen collection/handling, submission of specimen other than nasopharyngeal swab, presence of viral mutation(s) within the areas targeted by this assay, and inadequate number of viral copies(<138 copies/mL). A negative result must be combined with clinical observations, patient history, and epidemiological information. The expected result is Negative.  Fact Sheet for Patients:  BloggerCourse.com  Fact Sheet for Healthcare Providers:  SeriousBroker.it  This test is no t yet approved or cleared by the [place] FDA and  has been authorized for detection and/or diagnosis of SARS-CoV-2 by FDA under an Emergency Use Authorization (EUA). This EUA will remain  in effect (meaning this test can be used) for the duration of the COVID-19 declaration under Section 564(b)(1) of the Act, 21 U.S.C.section 360bbb-3(b)(1), unless the authorization is terminated  or revoked sooner.       Influenza A by PCR NEGATIVE NEGATIVE Final   Influenza B by PCR NEGATIVE NEGATIVE Final     Comment: (NOTE) The Xpert Xpress SARS-CoV-2/FLU/RSV plus assay is intended as an aid in the diagnosis of influenza from Nasopharyngeal swab specimens and should not be used as a sole basis for treatment. Nasal washings and aspirates are unacceptable for Xpert Xpress SARS-CoV-2/FLU/RSV testing.  Fact Sheet for Patients: BloggerCourse.com  Fact Sheet for Healthcare Providers: SeriousBroker.it  This test is not yet approved or cleared by the [place] FDA and has been authorized for detection and/or diagnosis of SARS-CoV-2 by FDA under an Emergency Use Authorization (EUA). This EUA will remain in effect (meaning this test can be used) for the duration of the COVID-19 declaration under Section 564(b)(1) of the Act, 21 U.S.C. section 360bbb-3(b)(1), unless the authorization is terminated or revoked.     Resp Syncytial Virus by PCR NEGATIVE NEGATIVE Final    Comment: (NOTE) Fact Sheet for Patients: BloggerCourse.com  Fact Sheet for Healthcare Providers: SeriousBroker.it  This test is not yet approved or cleared by the [place] FDA and has been authorized for detection and/or diagnosis of SARS-CoV-2 by FDA under an Emergency Use Authorization (EUA). This EUA will remain in effect (meaning this test can be used) for the duration of the COVID-19 declaration under Section 564(b)(1) of the Act, 21 U.S.C. section 360bbb-3(b)(1), unless the authorization is terminated or revoked.  Performed at [facility], [address] [place], [place] [address]   MRSA Next Gen by PCR, Nasal     Status: None   Collection Time: 09/15/22  8:10 PM   Specimen: Nasal Mucosa; Nasal Swab  Result Value Ref Range Status   MRSA by PCR Next Gen NOT DETECTED NOT DETECTED Final    Comment: (NOTE) The GeneXpert MRSA Assay (FDA approved for NASAL specimens only), is one component of a  comprehensive MRSA colonization surveillance program. It is not intended to diagnose MRSA infection nor to guide or monitor treatment for MRSA infections. Test performance is not FDA approved in patients less than 10 years old. Performed at [facility], [address] [place], [place] [address]   Culture, blood (Routine X 2) w Reflex to ID Panel     Status: None (Preliminary result)  Collection Time: 09/19/22  7:12 PM   Specimen: BLOOD RIGHT HAND  Result Value Ref Range Status   Specimen Description BLOOD RIGHT HAND  Final   Special Requests   Final    BOTTLES DRAWN AEROBIC AND ANAEROBIC Blood Culture adequate volume   Culture   Final    NO GROWTH 4 DAYS Performed at [facility], [address], [place], [place] [address]    Report Status PENDING  Incomplete  Culture, blood (Routine X 2) w Reflex to ID Panel     Status: None (Preliminary result)   Collection Time: 09/19/22  7:14 PM   Specimen: BLOOD RIGHT ARM  Result Value Ref Range Status   Specimen Description BLOOD RIGHT ARM  Final   Special Requests   Final    BOTTLES DRAWN AEROBIC ONLY Blood Culture adequate volume   Culture   Final    NO GROWTH 4 DAYS Performed at [facility], [address], [place], [place] [address]    Report Status PENDING  Incomplete  Expectorated Sputum Assessment w Gram Stain, Rflx to Resp Cult     Status: None   Collection Time: 09/19/22 10:19 PM   Specimen: Sputum  Result Value Ref Range Status   Specimen Description SPUTUM  Final   Special Requests NONE  Final   Sputum evaluation   Final    THIS SPECIMEN IS ACCEPTABLE FOR SPUTUM CULTURE Performed at [facility], [address], [place], [place] [address]    Report Status 09/19/2022 FINAL  Final  Culture, Respiratory w Gram Stain     Status: None   Collection Time: 09/19/22 10:19 PM   Specimen: SPU  Result Value Ref Range Status   Specimen Description SPUTUM  Final   Special Requests NONE Reflexed from [id number]   Final   Gram Stain   Final    RARE WBC PRESENT, PREDOMINANTLY PMN RARE GRAM POSITIVE COCCI Performed at [facility], [address], [place], [place] [address]    Culture ABUNDANT STAPHYLOCOCCUS LUGDUNENSIS  Final   Report Status 09/22/2022 FINAL  Final   Organism ID, Bacteria STAPHYLOCOCCUS LUGDUNENSIS  Final      Susceptibility   Staphylococcus lugdunensis - MIC*    CIPROFLOXACIN <=0.5 SENSITIVE Sensitive     ERYTHROMYCIN <=0.25 SENSITIVE Sensitive     GENTAMICIN <=0.5 SENSITIVE Sensitive     OXACILLIN 2 SENSITIVE Sensitive     TETRACYCLINE <=1 SENSITIVE Sensitive     VANCOMYCIN <=0.5 SENSITIVE Sensitive     TRIMETH/SULFA <=10 SENSITIVE Sensitive     CLINDAMYCIN <=0.25 SENSITIVE Sensitive     RIFAMPIN <=0.5 SENSITIVE Sensitive     Inducible Clindamycin NEGATIVE Sensitive     * ABUNDANT STAPHYLOCOCCUS LUGDUNENSIS    RADIOLOGY STUDIES/RESULTS: DG Swallowing Func-Speech Pathology  Result Date: 09/22/2022 Table formatting from the original result was not included. Modified Barium Swallow Study Patient Details Name: Alesandro Petithomme MRN: [id number] Date of Birth: [birth year] Today's Date: 09/22/2022 HPI/PMH: HPI: Pt is a 67 y.o. male who presented to [facility] on 09/15/22 with AMS and seizure-like activity. Pt was sedated and intubated then transferred to [facility] for LTM. 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 NPO diet ordered by ST on 09/07. He has since weaned off norepinephrine and was transferred out of ICU on 9/9.  PMH is significant for HTN, CHF, CAD s/p stenting and PPM, COPD, EtOH dependence. Clinical Impression: Clinical Impression: Pt presents  with an oral dysphagia primarily characterized by poor bolus awareness, control, holding, and mastication. No s/sx of aspiration or penetration were observed during this evaluation. Oral manipulation of bolus was prolonged and disorganized with all consistencies administered, but was most impaired  with solid. Mastication was incredibly prolonged. Pt appeared distractable during mastication and required one verbal cue to redirect attention back to swallow during solid administration. One instance of a delayed cough with no asipiration or penetration present. Trace residue observed on both oral cavity and pharyngeal structures. Airway protection was overall functional. Swallow was initiated at the level of the vallecula and epiglottic inversion wad complete. SLP reccommendation of Dys 1 (puree) and thin liquids with full supervsion to ensure pt does not become distracted. SLP will continue to follow for diet toleration and upgraded PO trials. Factors that may increase risk of adverse event in presence of aspiration Rubye Oaks & Clearance Coots 2021): Factors that may increase risk of adverse event in presence of aspiration Rubye Oaks & Clearance Coots 2021): Reduced cognitive function Recommendations/Plan: Swallowing Evaluation Recommendations Swallowing Evaluation Recommendations Recommendations: PO diet PO Diet Recommendation: Dysphagia 1 (Pureed); Thin liquids (Level 0) Liquid Administration via: Cup; Straw; Spoon Medication Administration: Crushed with puree Supervision: Full supervision/cueing for swallowing strategies Swallowing strategies  : Slow rate; Small bites/sips Postural changes: Position pt fully upright for meals Oral care recommendations: Oral care BID (2x/day); Staff/trained caregiver to provide oral care Treatment Plan Treatment Plan Treatment recommendations: Therapy as outlined in treatment plan below Follow-up recommendations: Acute inpatient rehab (3 hours/day) Functional status assessment: Patient has had a recent decline in their functional status and demonstrates the ability to make significant improvements in function in a reasonable and predictable amount of time. Treatment frequency: Min 2x/week Treatment duration: 2 weeks Interventions: Aspiration precaution training; Diet toleration management by SLP;  Patient/family education; Trials of upgraded texture/liquids Recommendations Recommendations for follow up therapy are one component of a multi-disciplinary discharge planning process, led by the attending physician.  Recommendations may be updated based on patient status, additional functional criteria and insurance authorization. Assessment: Orofacial Exam: Orofacial Exam Oral Cavity: Oral Hygiene: WFL Oral Cavity - Dentition: Edentulous Orofacial Anatomy: WFL Oral Motor/Sensory Function: WFL Anatomy: Anatomy: WFL Boluses Administered: Boluses Administered Boluses Administered: Thin liquids (Level 0); Mildly thick liquids (Level 2, nectar thick); Moderately thick liquids (Level 3, honey thick); Puree; Solid  Oral Impairment Domain: Oral Impairment Domain Bolus preparation/mastication: Slow prolonged chewing/mashing with complete recollection Bolus transport/lingual motion: Delayed initiation of tongue motion (oral holding); Slow tongue motion Oral residue: Complete oral clearance Initiation of pharyngeal swallow : Valleculae  Pharyngeal Impairment Domain: Pharyngeal Impairment Domain Soft palate elevation: No bolus between soft palate (SP)/pharyngeal wall (PW) Laryngeal elevation: Complete superior movement of thyroid cartilage with complete approximation of arytenoids to epiglottic petiole Anterior hyoid excursion: Complete anterior movement Epiglottic movement: Complete inversion Laryngeal vestibule closure: Complete, no air/contrast in laryngeal vestibule Pharyngeal stripping wave : Present - complete Pharyngeal contraction (A/P view only): N/A Pharyngoesophageal segment opening: Complete distension and complete duration, no obstruction of flow Tongue base retraction: No contrast between tongue base and posterior pharyngeal wall (PPW) Pharyngeal residue: Trace residue within or on pharyngeal structures Location of pharyngeal residue: Valleculae; Pharyngeal wall  Esophageal Impairment Domain: Esophageal  Impairment Domain Esophageal clearance upright position: Complete clearance, esophageal coating Pill: No data recorded Penetration/Aspiration Scale Score: Penetration/Aspiration Scale Score 1.  Material does not enter airway: Thin liquids (Level 0); Mildly thick liquids (Level 2, nectar thick); Solid; Puree; Moderately thick liquids (Level 3, honey thick)  Compensatory Strategies: Compensatory Strategies Compensatory strategies: Yes Straw: Ineffective Ineffective Straw: Thin liquid (Level 0)   General Information: Caregiver present: No  Diet Prior to this Study: NPO; Cortrak/Small bore NG tube   Temperature : Normal   Respiratory Status: WFL   Supplemental O2: Nasal cannula   History of Recent Intubation: Yes  Behavior/Cognition: Requires cueing; Cooperative; Alert Self-Feeding Abilities: Needs assist with self-feeding Baseline vocal quality/speech: Normal No data recorded Volitional Swallow: Able to elicit Exam Limitations: Poor positioning Goal Planning: Prognosis for improved oropharyngeal function: Good No data recorded No data recorded Patient/Family Stated Goal: not stated Consulted and agree with results and recommendations: Pt unable/family or caregiver not available Pain: Pain Assessment Pain Assessment: No/denies pain Faces Pain Scale: 0 Facial Expression: 0 Body Movements: 0 Muscle Tension: 0 Compliance with ventilator (intubated pts.): N/A Vocalization (extubated pts.): 0 CPOT Total: 0 Pain Location: tingling in LLE Pain Descriptors / Indicators: Discomfort Pain Intervention(s): Limited activity within patient's tolerance; Monitored during session End of Session: Start Time:SLP Start Time (ACUTE ONLY): 1330 Stop Time: SLP Stop Time (ACUTE ONLY): 1340 Time Calculation:SLP Time Calculation (min) (ACUTE ONLY): 10 min Charges: SLP Evaluations $ SLP Speech Visit: 1 Visit SLP Evaluations $MBS Swallow: 1 Procedure $ SLP EVAL LANGUAGE/SOUND PRODUCTION: 1 Procedure $Swallowing Treatment: 1 Procedure SLP visit  diagnosis: SLP Visit Diagnosis: Dysphagia, oral phase (R13.11) Past Medical History: Past Medical History: Diagnosis Date  Myocardial infarction (HCC) 1989 Past Surgical History: Past Surgical History: Procedure Laterality Date  FEMORAL-POPLITEAL BYPASS GRAFT Bilateral 2021  GALLBLADDER SURGERY  2019  VASECTOMY  1992 Angela Nevin, MA, CCC-SLP Speech Therapy     LOS: 8 days   Jeoffrey Massed, MD  [facility]    To contact the attending provider between 7A-7P or the covering provider during after hours 7P-7A, please log into the web site [email or web address] and access using universal [place] password for that web site. If you do not have the password, please call the hospital operator.  09/23/2022, 10:18 AM

## 2022-09-23 NOTE — Progress Notes (Signed)
Pt,is transferred to 5W room12.

## 2022-09-23 NOTE — Plan of Care (Signed)
  Problem: Education: Goal: Knowledge of General Education information will improve Description: Including pain rating scale, medication(s)/side effects and non-pharmacologic comfort measures Outcome: Progressing   Problem: Health Behavior/Discharge Planning: Goal: Ability to manage health-related needs will improve Outcome: Progressing   Problem: Clinical Measurements: Goal: Diagnostic test results will improve Outcome: Progressing   Problem: Nutrition: Goal: Adequate nutrition will be maintained Outcome: Progressing   Problem: Skin Integrity: Goal: Risk for impaired skin integrity will decrease Outcome: Progressing   Problem: Role Relationship: Goal: Method of communication will improve Outcome: Progressing

## 2022-09-23 NOTE — Progress Notes (Signed)
Inpatient Rehab Admissions Coordinator:    CIR following. Pt. With some tachycardia and hgb of 7 this AM, also refused OOB with PT yesterday. Would like to see him a bit more stable and working with therapy before transferring to rehab. I will follow up tomorrow.   Megan Salon, MS, CCC-SLP Rehab Admissions Coordinator  [phone] (celll) [phone] (office)

## 2022-09-24 ENCOUNTER — Other Ambulatory Visit (HOSPITAL_COMMUNITY): Payer: Self-pay

## 2022-09-24 ENCOUNTER — Encounter (HOSPITAL_COMMUNITY): Payer: Self-pay | Admitting: Internal Medicine

## 2022-09-24 ENCOUNTER — Inpatient Hospital Stay (HOSPITAL_COMMUNITY)
Admission: AD | Admit: 2022-09-24 | Discharge: 2022-10-02 | DRG: 056 | Disposition: A | Discharge status: Home / Self Care | Payer: Medicare Other | Source: Intra-hospital | Attending: Physical Medicine and Rehabilitation | Admitting: Physical Medicine and Rehabilitation

## 2022-09-24 DIAGNOSIS — F1010 Alcohol abuse, uncomplicated: Secondary | ICD-10-CM | POA: Insufficient documentation

## 2022-09-24 DIAGNOSIS — F10139 Alcohol abuse with withdrawal, unspecified: Secondary | ICD-10-CM | POA: Diagnosis present

## 2022-09-24 DIAGNOSIS — R159 Full incontinence of feces: Secondary | ICD-10-CM | POA: Diagnosis present

## 2022-09-24 DIAGNOSIS — I63311 Cerebral infarction due to thrombosis of right middle cerebral artery: Secondary | ICD-10-CM

## 2022-09-24 DIAGNOSIS — G9340 Encephalopathy, unspecified: Secondary | ICD-10-CM | POA: Diagnosis not present

## 2022-09-24 DIAGNOSIS — E568 Deficiency of other vitamins: Secondary | ICD-10-CM | POA: Diagnosis present

## 2022-09-24 DIAGNOSIS — E519 Thiamine deficiency, unspecified: Secondary | ICD-10-CM | POA: Diagnosis present

## 2022-09-24 DIAGNOSIS — E509 Vitamin A deficiency, unspecified: Secondary | ICD-10-CM | POA: Diagnosis present

## 2022-09-24 DIAGNOSIS — E531 Pyridoxine deficiency: Secondary | ICD-10-CM | POA: Diagnosis present

## 2022-09-24 DIAGNOSIS — F17210 Nicotine dependence, cigarettes, uncomplicated: Secondary | ICD-10-CM | POA: Diagnosis present

## 2022-09-24 DIAGNOSIS — Z79899 Other long term (current) drug therapy: Secondary | ICD-10-CM

## 2022-09-24 DIAGNOSIS — J690 Pneumonitis due to inhalation of food and vomit: Secondary | ICD-10-CM | POA: Diagnosis present

## 2022-09-24 DIAGNOSIS — E43 Unspecified severe protein-calorie malnutrition: Secondary | ICD-10-CM | POA: Diagnosis present

## 2022-09-24 DIAGNOSIS — I69391 Dysphagia following cerebral infarction: Secondary | ICD-10-CM

## 2022-09-24 DIAGNOSIS — I5022 Chronic systolic (congestive) heart failure: Secondary | ICD-10-CM | POA: Diagnosis present

## 2022-09-24 DIAGNOSIS — G40901 Epilepsy, unspecified, not intractable, with status epilepticus: Secondary | ICD-10-CM | POA: Diagnosis not present

## 2022-09-24 DIAGNOSIS — Z9181 History of falling: Secondary | ICD-10-CM

## 2022-09-24 DIAGNOSIS — R000 Tachycardia, unspecified: Secondary | ICD-10-CM | POA: Diagnosis not present

## 2022-09-24 DIAGNOSIS — I2510 Atherosclerotic heart disease of native coronary artery without angina pectoris: Secondary | ICD-10-CM | POA: Diagnosis present

## 2022-09-24 DIAGNOSIS — I69328 Other speech and language deficits following cerebral infarction: Secondary | ICD-10-CM

## 2022-09-24 DIAGNOSIS — D696 Thrombocytopenia, unspecified: Secondary | ICD-10-CM | POA: Diagnosis present

## 2022-09-24 DIAGNOSIS — L89891 Pressure ulcer of other site, stage 1: Secondary | ICD-10-CM | POA: Diagnosis present

## 2022-09-24 DIAGNOSIS — I739 Peripheral vascular disease, unspecified: Secondary | ICD-10-CM | POA: Diagnosis present

## 2022-09-24 DIAGNOSIS — E612 Magnesium deficiency: Secondary | ICD-10-CM | POA: Diagnosis present

## 2022-09-24 DIAGNOSIS — E440 Moderate protein-calorie malnutrition: Secondary | ICD-10-CM | POA: Diagnosis present

## 2022-09-24 DIAGNOSIS — Z95810 Presence of automatic (implantable) cardiac defibrillator: Secondary | ICD-10-CM

## 2022-09-24 DIAGNOSIS — Z7902 Long term (current) use of antithrombotics/antiplatelets: Secondary | ICD-10-CM

## 2022-09-24 DIAGNOSIS — E538 Deficiency of other specified B group vitamins: Secondary | ICD-10-CM | POA: Diagnosis present

## 2022-09-24 DIAGNOSIS — D539 Nutritional anemia, unspecified: Secondary | ICD-10-CM | POA: Diagnosis present

## 2022-09-24 DIAGNOSIS — G4089 Other seizures: Secondary | ICD-10-CM | POA: Diagnosis present

## 2022-09-24 DIAGNOSIS — I639 Cerebral infarction, unspecified: Secondary | ICD-10-CM | POA: Diagnosis present

## 2022-09-24 DIAGNOSIS — N179 Acute kidney failure, unspecified: Secondary | ICD-10-CM | POA: Diagnosis present

## 2022-09-24 DIAGNOSIS — L89316 Pressure-induced deep tissue damage of right buttock: Secondary | ICD-10-CM | POA: Diagnosis present

## 2022-09-24 DIAGNOSIS — E875 Hyperkalemia: Secondary | ICD-10-CM | POA: Diagnosis present

## 2022-09-24 DIAGNOSIS — R6881 Early satiety: Secondary | ICD-10-CM | POA: Diagnosis not present

## 2022-09-24 DIAGNOSIS — Z7982 Long term (current) use of aspirin: Secondary | ICD-10-CM

## 2022-09-24 DIAGNOSIS — I69398 Other sequelae of cerebral infarction: Secondary | ICD-10-CM

## 2022-09-24 DIAGNOSIS — Z95828 Presence of other vascular implants and grafts: Secondary | ICD-10-CM

## 2022-09-24 DIAGNOSIS — R1310 Dysphagia, unspecified: Secondary | ICD-10-CM | POA: Diagnosis present

## 2022-09-24 DIAGNOSIS — R490 Dysphonia: Secondary | ICD-10-CM | POA: Diagnosis present

## 2022-09-24 DIAGNOSIS — Z8052 Family history of malignant neoplasm of bladder: Secondary | ICD-10-CM

## 2022-09-24 DIAGNOSIS — I252 Old myocardial infarction: Secondary | ICD-10-CM

## 2022-09-24 DIAGNOSIS — I69319 Unspecified symptoms and signs involving cognitive functions following cerebral infarction: Secondary | ICD-10-CM | POA: Diagnosis present

## 2022-09-24 DIAGNOSIS — Z8249 Family history of ischemic heart disease and other diseases of the circulatory system: Secondary | ICD-10-CM

## 2022-09-24 DIAGNOSIS — B957 Other staphylococcus as the cause of diseases classified elsewhere: Secondary | ICD-10-CM | POA: Diagnosis present

## 2022-09-24 DIAGNOSIS — Z825 Family history of asthma and other chronic lower respiratory diseases: Secondary | ICD-10-CM

## 2022-09-24 DIAGNOSIS — D62 Acute posthemorrhagic anemia: Secondary | ICD-10-CM | POA: Diagnosis present

## 2022-09-24 DIAGNOSIS — N1831 Chronic kidney disease, stage 3a: Secondary | ICD-10-CM | POA: Insufficient documentation

## 2022-09-24 DIAGNOSIS — L89151 Pressure ulcer of sacral region, stage 1: Secondary | ICD-10-CM | POA: Diagnosis present

## 2022-09-24 LAB — FOLATE: Folate: 13.4 ng/mL (ref >5.9)

## 2022-09-24 LAB — BASIC METABOLIC PANEL
Anion gap: 6 (ref 5–15)
BUN: 44 mg/dL — ABNORMAL HIGH (ref 8–23)
CO2: 21 mmol/L — ABNORMAL LOW (ref 22–32)
Calcium: 8.3 mg/dL — ABNORMAL LOW (ref 8.9–10.3)
Chloride: 112 mmol/L — ABNORMAL HIGH (ref 98–111)
Creatinine, Ser: 1.4 mg/dL — ABNORMAL HIGH (ref 0.61–1.24)
GFR, Estimated: 55 mL/min — ABNORMAL LOW (ref >60)
Glucose, Bld: 133 mg/dL — ABNORMAL HIGH (ref 70–99)
Potassium: 5.2 mmol/L — ABNORMAL HIGH (ref 3.5–5.1)
Sodium: 139 mmol/L (ref 135–145)

## 2022-09-24 LAB — IRON AND TIBC
Iron: 28 ug/dL — ABNORMAL LOW (ref 45–182)
Saturation Ratios: 19 % (ref 17.9–39.5)
TIBC: 147 ug/dL — ABNORMAL LOW (ref 250–450)
UIBC: 119 ug/dL

## 2022-09-24 LAB — GLUCOSE, CAPILLARY
Glucose-Capillary: 119 mg/dL — ABNORMAL HIGH (ref 70–99)
Glucose-Capillary: 119 mg/dL — ABNORMAL HIGH (ref 70–99)
Glucose-Capillary: 127 mg/dL — ABNORMAL HIGH (ref 70–99)
Glucose-Capillary: 128 mg/dL — ABNORMAL HIGH (ref 70–99)
Glucose-Capillary: 132 mg/dL — ABNORMAL HIGH (ref 70–99)

## 2022-09-24 LAB — FERRITIN: Ferritin: 2896 ng/mL — ABNORMAL HIGH (ref 24–336)

## 2022-09-24 LAB — CULTURE, BLOOD (ROUTINE X 2)
Culture: NO GROWTH
Culture: NO GROWTH
Special Requests: ADEQUATE
Special Requests: ADEQUATE

## 2022-09-24 LAB — CBC
HCT: 21.3 % — ABNORMAL LOW (ref 39.0–52.0)
Hemoglobin: 6.9 g/dL — CL (ref 13.0–17.0)
MCH: 34 pg (ref 26.0–34.0)
MCHC: 32.4 g/dL (ref 30.0–36.0)
MCV: 104.9 fL — ABNORMAL HIGH (ref 80.0–100.0)
Platelets: 244 10*3/uL (ref 150–400)
RBC: 2.03 MIL/uL — ABNORMAL LOW (ref 4.22–5.81)
RDW: 17.6 % — ABNORMAL HIGH (ref 11.5–15.5)
WBC: 7.4 10*3/uL (ref 4.0–10.5)
nRBC: 0 % (ref 0.0–0.2)

## 2022-09-24 LAB — RETICULOCYTES
Immature Retic Fract: 20.7 % — ABNORMAL HIGH (ref 2.3–15.9)
RBC.: 2.04 MIL/uL — ABNORMAL LOW (ref 4.22–5.81)
Retic Count, Absolute: 46.7 10*3/uL (ref 19.0–186.0)
Retic Ct Pct: 2.3 % (ref 0.4–3.1)

## 2022-09-24 LAB — PREPARE RBC (CROSSMATCH)

## 2022-09-24 LAB — ABO/RH: ABO/RH(D): A POS

## 2022-09-24 LAB — VITAMIN B12: Vitamin B-12: 258 pg/mL (ref 180–914)

## 2022-09-24 LAB — PHOSPHORUS: Phosphorus: 1.4 mg/dL — ABNORMAL LOW (ref 2.5–4.6)

## 2022-09-24 MED ORDER — FOLIC ACID 1 MG PO TABS: 1.0000 mg | ORAL_TABLET | Freq: Every day | ORAL | Status: DC

## 2022-09-24 MED ORDER — ALUM & MAG HYDROXIDE-SIMETH 200-200-20 MG/5ML PO SUSP: 30.0000 mL | ORAL | Status: DC | PRN

## 2022-09-24 MED ORDER — CHLORHEXIDINE GLUCONATE CLOTH 2 % EX PADS: 6.0000 | MEDICATED_PAD | CUTANEOUS | Status: DC

## 2022-09-24 MED ORDER — VITAMIN C 500 MG PO TABS
500.0000 mg | ORAL_TABLET | Freq: Every day | ORAL | Status: DC
  Administered 2022-09-24 – 2022-10-02 (×9): 500 mg via ORAL
  Filled 2022-09-24 (×9): qty 1

## 2022-09-24 MED ORDER — ATORVASTATIN CALCIUM 80 MG PO TABS: 80.0000 mg | ORAL_TABLET | Freq: Every day | ORAL | Status: DC

## 2022-09-24 MED ORDER — SODIUM CHLORIDE 0.9% IV SOLUTION: Freq: Once | INTRAVENOUS | Status: AC

## 2022-09-24 MED ORDER — CYANOCOBALAMIN 1000 MCG/ML IJ SOLN
1000.0000 ug | Freq: Every day | INTRAMUSCULAR | Status: AC
  Administered 2022-09-24 – 2022-09-29 (×6): 1000 ug via SUBCUTANEOUS
  Filled 2022-09-24 (×6): qty 1

## 2022-09-24 MED ORDER — ADULT MULTIVITAMIN W/MINERALS CH
1.0000 | ORAL_TABLET | Freq: Every day | ORAL | Status: DC
  Administered 2022-09-25 – 2022-10-02 (×8): 1 via ORAL
  Filled 2022-09-24 (×8): qty 1

## 2022-09-24 MED ORDER — JUVEN PO PACK
1.0000 | PACK | Freq: Two times a day (BID) | ORAL | Status: DC
  Administered 2022-09-25: 1
  Filled 2022-09-24 (×2): qty 1

## 2022-09-24 MED ORDER — VITAL 1.5 CAL PO LIQD
1000.0000 mL | ORAL | Status: DC
  Administered 2022-09-24 – 2022-09-25 (×2): 1000 mL
  Filled 2022-09-24 (×3): qty 1000

## 2022-09-24 MED ORDER — ATORVASTATIN CALCIUM 80 MG PO TABS
80.0000 mg | ORAL_TABLET | Freq: Every day | ORAL | Status: DC
  Administered 2022-09-25 – 2022-10-02 (×8): 80 mg via ORAL
  Filled 2022-09-24 (×8): qty 1

## 2022-09-24 MED ORDER — PROCHLORPERAZINE MALEATE 5 MG PO TABS: 5.0000 mg | ORAL_TABLET | Freq: Four times a day (QID) | ORAL | Status: DC | PRN

## 2022-09-24 MED ORDER — ORAL CARE MOUTH RINSE
15.0000 mL | OROMUCOSAL | Status: DC
  Administered 2022-09-24 – 2022-10-02 (×22): 15 mL via OROMUCOSAL

## 2022-09-24 MED ORDER — CYANOCOBALAMIN 1000 MCG/ML IJ SOLN
1000.0000 ug | Freq: Every day | INTRAMUSCULAR | Status: DC
  Administered 2022-09-24: 1000 ug via SUBCUTANEOUS
  Filled 2022-09-24: qty 1

## 2022-09-24 MED ORDER — GUAIFENESIN-DM 100-10 MG/5ML PO SYRP: 5.0000 mL | ORAL_SOLUTION | Freq: Four times a day (QID) | ORAL | Status: DC | PRN

## 2022-09-24 MED ORDER — VITAL 1.5 CAL PO LIQD: 1000.0000 mL | ORAL | Status: DC

## 2022-09-24 MED ORDER — FREE WATER
300.0000 mL | Freq: Four times a day (QID) | Status: DC
  Administered 2022-09-25 – 2022-09-29 (×16): 300 mL

## 2022-09-24 MED ORDER — PROSOURCE TF20 ENFIT COMPATIBL EN LIQD: 60.0000 mL | Freq: Every day | ENTERAL | Status: DC

## 2022-09-24 MED ORDER — PROSOURCE TF20 ENFIT COMPATIBL EN LIQD
60.0000 mL | Freq: Every day | ENTERAL | Status: DC
  Administered 2022-09-25: 60 mL
  Filled 2022-09-24: qty 60

## 2022-09-24 MED ORDER — PANTOPRAZOLE SODIUM 40 MG IV SOLR
40.0000 mg | Freq: Every day | INTRAVENOUS | Status: DC
  Administered 2022-09-24 – 2022-09-25 (×2): 40 mg via INTRAVENOUS
  Filled 2022-09-24 (×2): qty 10

## 2022-09-24 MED ORDER — GERHARDT'S BUTT CREAM: 1.0000 | TOPICAL_CREAM | Freq: Three times a day (TID) | CUTANEOUS | Status: DC

## 2022-09-24 MED ORDER — FOLIC ACID 1 MG PO TABS
1.0000 mg | ORAL_TABLET | Freq: Every day | ORAL | Status: DC
  Administered 2022-09-25 – 2022-10-02 (×8): 1 mg via ORAL
  Filled 2022-09-24 (×8): qty 1

## 2022-09-24 MED ORDER — SODIUM PHOSPHATES 45 MMOLE/15ML IV SOLN
30.0000 mmol | Freq: Once | INTRAVENOUS | Status: AC
  Administered 2022-09-24: 30 mmol via INTRAVENOUS
  Filled 2022-09-24: qty 10

## 2022-09-24 MED ORDER — AMOXICILLIN-POT CLAVULANATE 875-125 MG PO TABS
1.0000 | ORAL_TABLET | Freq: Two times a day (BID) | ORAL | Status: AC
  Administered 2022-09-24: 1
  Filled 2022-09-24: qty 1

## 2022-09-24 MED ORDER — FREE WATER
300.0000 mL | Freq: Four times a day (QID) | Status: DC
  Administered 2022-09-24 (×2): 300 mL

## 2022-09-24 MED ORDER — PANTOPRAZOLE SODIUM 40 MG PO TBEC
40.0000 mg | DELAYED_RELEASE_TABLET | Freq: Every day | ORAL | 1 refills | Status: DC
Start: 2022-09-24 — End: 2022-10-01
  Filled 2022-09-24: qty 30, 30d supply, fill #0

## 2022-09-24 MED ORDER — MIRTAZAPINE 30 MG PO TABS: 30.0000 mg | ORAL_TABLET | Freq: Every day | ORAL | Status: DC

## 2022-09-24 MED ORDER — JUVEN PO PACK: 1.0000 | PACK | Freq: Two times a day (BID) | ORAL | Status: DC

## 2022-09-24 MED ORDER — ACETAMINOPHEN 325 MG PO TABS: 325.0000 mg | ORAL_TABLET | ORAL | Status: DC | PRN

## 2022-09-24 MED ORDER — ASPIRIN 81 MG PO CHEW
81.0000 mg | CHEWABLE_TABLET | Freq: Every day | ORAL | Status: DC
  Administered 2022-09-25 – 2022-10-02 (×8): 81 mg via ORAL
  Filled 2022-09-24 (×8): qty 1

## 2022-09-24 MED ORDER — TRAZODONE HCL 50 MG PO TABS: 25.0000 mg | ORAL_TABLET | Freq: Every evening | ORAL | Status: DC | PRN

## 2022-09-24 MED ORDER — ALBUTEROL SULFATE (2.5 MG/3ML) 0.083% IN NEBU: 2.5000 mg | INHALATION_SOLUTION | RESPIRATORY_TRACT | Status: DC | PRN

## 2022-09-24 MED ORDER — GERHARDT'S BUTT CREAM
TOPICAL_CREAM | Freq: Three times a day (TID) | CUTANEOUS | Status: DC
  Filled 2022-09-24 (×2): qty 1

## 2022-09-24 MED ORDER — PROCHLORPERAZINE 25 MG RE SUPP: 12.5000 mg | Freq: Four times a day (QID) | RECTAL | Status: DC | PRN

## 2022-09-24 MED ORDER — MAGNESIUM SULFATE IN D5W 1-5 GM/100ML-% IV SOLN
1.0000 g | Freq: Once | INTRAVENOUS | Status: AC
  Administered 2022-09-24: 1 g via INTRAVENOUS
  Filled 2022-09-24: qty 100

## 2022-09-24 MED ORDER — GUAIFENESIN 100 MG/5ML PO LIQD: 15.0000 mL | Freq: Four times a day (QID) | ORAL | Status: DC | PRN

## 2022-09-24 MED ORDER — BISACODYL 10 MG RE SUPP: 10.0000 mg | Freq: Every day | RECTAL | Status: DC | PRN

## 2022-09-24 MED ORDER — POLYETHYLENE GLYCOL 3350 17 G PO PACK: 17.0000 g | PACK | Freq: Every day | ORAL | Status: DC | PRN

## 2022-09-24 MED ORDER — CYANOCOBALAMIN 1000 MCG/ML IJ SOLN: 1000.0000 ug | Freq: Every day | INTRAMUSCULAR | Status: DC

## 2022-09-24 MED ORDER — VITAMIN B-1 100 MG PO TABS: 100.0000 mg | ORAL_TABLET | Freq: Every day | ORAL | Status: DC

## 2022-09-24 MED ORDER — ASPIRIN 81 MG PO CHEW: 81.0000 mg | CHEWABLE_TABLET | Freq: Every day | ORAL | Status: DC

## 2022-09-24 MED ORDER — AMOXICILLIN-POT CLAVULANATE 875-125 MG PO TABS: 1.0000 | ORAL_TABLET | Freq: Two times a day (BID) | ORAL | Status: DC

## 2022-09-24 MED ORDER — MILK AND MOLASSES ENEMA: 1.0000 | Freq: Every day | RECTAL | Status: DC | PRN

## 2022-09-24 MED ORDER — NEPRO/CARBSTEADY PO LIQD: 500.0000 mL | ORAL | Status: DC

## 2022-09-24 MED ORDER — SODIUM ZIRCONIUM CYCLOSILICATE 10 G PO PACK
10.0000 g | PACK | Freq: Every day | ORAL | Status: AC
  Administered 2022-09-24: 10 g via ORAL
  Filled 2022-09-24: qty 1

## 2022-09-24 MED ORDER — DIPHENHYDRAMINE HCL 25 MG PO CAPS: 25.0000 mg | ORAL_CAPSULE | Freq: Four times a day (QID) | ORAL | Status: DC | PRN

## 2022-09-24 MED ORDER — ENOXAPARIN SODIUM 40 MG/0.4ML IJ SOSY
40.0000 mg | PREFILLED_SYRINGE | INTRAMUSCULAR | Status: DC
  Administered 2022-09-24 – 2022-09-30 (×7): 40 mg via SUBCUTANEOUS
  Filled 2022-09-24 (×8): qty 0.4

## 2022-09-24 MED ORDER — ORAL CARE MOUTH RINSE: 15.0000 mL | OROMUCOSAL | Status: DC | PRN

## 2022-09-24 MED ORDER — PROCHLORPERAZINE EDISYLATE 10 MG/2ML IJ SOLN: 5.0000 mg | Freq: Four times a day (QID) | INTRAMUSCULAR | Status: DC | PRN

## 2022-09-24 MED ORDER — THIAMINE MONONITRATE 100 MG PO TABS
100.0000 mg | ORAL_TABLET | Freq: Every day | ORAL | Status: DC
  Administered 2022-09-25 – 2022-10-02 (×8): 100 mg via ORAL
  Filled 2022-09-24 (×8): qty 1

## 2022-09-24 MED ORDER — MIRTAZAPINE 15 MG PO TABS
30.0000 mg | ORAL_TABLET | Freq: Every day | ORAL | Status: DC
  Administered 2022-09-24 – 2022-10-01 (×8): 30 mg via ORAL
  Filled 2022-09-24 (×8): qty 2

## 2022-09-24 MED ORDER — FREE WATER: 300.0000 mL | Freq: Four times a day (QID) | Status: DC

## 2022-09-24 NOTE — Discharge Instructions (Signed)
In a time of Crisis: [facility]  Mobile Crisis Management provides immediate crisis response, 24/7.  Call [phone]  [facility] for MH/DD/SA [facility] is available 24 hours a day, 7 days a week. Customer Service Specialists will assist you to find a crisis provider that is well-matched with your needs. Your local number is: [phone]  [facility] Center/Behavioral Health Urgent Care (BHUC) IOP, individual counseling, medication management [address] [place], [place] [address] [phone] Call for intake hours; Medicaid and Uninsured    Outpatient Providers  [facility] ([facility]) Group and individual counseling. [address]  [place], [place] [address] [phone] [place]: [phone]  [place]: [phone] Medicaid and uninsured.   [facility] Offers IOP groups multiple times per week. [address] Sherian Maroon [place], [place] [address] [phone] Takes Medicaid and other insurances.   [facility]  Chemical Dependency Intensive Outpatient Program (IOP) [address] [place], [place] [address] [phone] Takes Nurse, learning disability and [place].   [facility]  IOP and Partial Hospitalization Program  [address]  [place], [place] [address] [phone] Private Insurance, [place] only for partial hospitalization  [facility] [address], [place], [place] [address] [phone] Monday-Friday. Short and Long term options. [facility] Urgent Care (BHUC) IOP, individual counseling, medication management [address] [place], [place] [address] [phone] Medicaid and [facility]  [facility] [address]  [place], [place] [address] [phone] Private Insurance and Self Pay   [facility] [address]  [place], [place] [address] [phone] Private Insurance, [place], and Self Pay   [facility]  [address] [place], [place] [address] [facility]  [address]  [place], [place] [address] [phone]  [facility]  [address] [place], [place] [address] [phone]  [facility] [phone] Marcy Panning and [facility]      Residential Treatment Programs  [facility] [address] [place], [place] [address] [phone] or [phone] Detox and Residential Rehab 14 days (Medicare, Medicaid, private insurance, and self pay). No methadone. Call for pre-screen.   [facility]) [address]  [place], [place] [address] [phone] Detox (self Pay and Medicaid Limited availability) Rehab Only Male (Medicare, Medicaid, and Self Pay)-No methadone.  [facility] [address] [place], [place] [address] [phone] or [phone] Private Insurance only  [facility] [place] [facility] [place], [place] [address] Phone:  [phone] Must be detoxed 72 hours prior to admission; 28 day program.  Self-pay.  [facility] [address]  [place], [place] [phone] [employer], Medicare, [place] (not straight [place]). They offer assistance with transportation.   [facility] [address] [place],  [place], [place] [address] [phone] Christian Based Program. Men only. No insurance  [facility] [address] [place], [place] [address] Women's: [phone] Men's: [phone] No Medicaid.   [facility] [place] Locations across the [place]mainly [place] willing to help with transportation.  [phone] [employer]. [facility]  [address] [place]  [place], [place] [address] [phone] Treatment Only, must make  assessment appointment, and must be sober for assessment appointment. Self pay, Medicare A and B, [facility],  must be [facility] resident. No methadone.   [address] [place], [place] [address] Phone: [phone] Inpatient 24/7 and outpatient services. Individuals with Medicaid have no obligation for a copay. Individuals with Medicare or private insurance will be obligated to meet their policy's requirement(s). Individuals who are uninsured will be eligible for a sliding or discounted scaled  [facility]  [address] [place], [place] [address] [phone] No pending legal charges, Long-term work program. No methadone. Call for assessment.  [facility]  [address], [place], [place] [address] [phone] or [phone] Commercial Insurance Only  [facility] - [phone] Private Insurance (no [place]). Males/Females, call to make referrals, multiple facilities   [facility] [address]  [place], [place] [address]  [phone] Men Only Upfront Fee   [facility]one [facility] [address] [place], [place] [address] [phone] (f)         AA Meetings Website to locate meetings (virtually or in person): [email or web address].[email or web address]/ Phone: [phone]  Syringe Services Program: Due to COVID-19, syringe services programs are likely operating under different hours with limited or no fixed site hours. Some programs may not be operating at all. Please contact the program directly using the phone numbers provided below to see if they are still operating under COVID-19.  [facility] Solution to the Opioid Problem (GCSTOP) Fixed; mobile; peer-based Roxy Cedar [phone] [email or web address] Fixed site exchange at [facility], [address] [place], [place] [address] on Wednesdays (2:00 - 5:00 pm) and Thursdays  (4:00 - 8:00 pm). Pop-up mobile exchange locations: [employer] and [employer], [address] [place], [place] [address] on Tuesdays (11:00 am - 1:00 pm) and Fridays (11:00 am - 1:00 pm)  -[facility] - [address], [place], [place] [address] on Tuesdays (2:00 - 4:00 pm) and Fridays (2:00 - 4:00 pm)  -[facility] copy - also serves Radio broadcast assistant and [employer] [place] [employer]; Fixed; mobile; peer-based; Lendon Ka [phone] [email or web address] [address], [place], [place] [address] Delivery and outreach available in [place] and [place], please call for more information. Monday, Tuesday: 1:00 -7:00 pm, Thursday: 4:00 pm - 8:00 pm or Friday: 1:00 pm - 8:00 pm  Medication-Assisted Treatment (MAT):  -[facility] services [address] and surrounding areas including [place], [place], [place], [place], [address], [place], [place], [place], [place], and [place], [place]. Options include Methadone, buprenorphine or Suboxone. [address] [place] G[place], [place] [address] Phone: [phone] Mon - Fri: 5:30am - 2:00pm Sat: 5:30am -7:30am Sun: Closed Holidays: 6:00am - 8:00am  -[facility] [place]- We use FDA-approved medications, like methadone/suboxone/sublocade, and vivitrol. These medications are then combined with customized care plans that include individual or group counseling, toxicology, and medical care directed by on-site physicians. Accepts most insurance plans, Medicaid, and private pay.  [address] [place], [place] [address] Phone: [phone] Monday-Friday 5:00 AM - 10:00 AM Saturday 6:00 AM - 8:30 AM Sunday 6:00 AM - 7:00 AM  -[facility]- [facility] is a treatment & recovery focused program. In addition to receiving methadone medication, our clients participate in individual and group counseling as well as random drug testing. If accepted into the [facility] Opioid Program, you  will be provided several intake appointments and a physical exam [address] [place], [place] [address] Office: [phone]  Fax: [phone]  -[facility]- We put our community members at the  center of everything we do, for remote treatment services as well as in-person, from alcohol withdrawal to opioid use and more.  [address], [place], [place] [address] [phone] Monday-Wednesday: 9:00am - 5:00pm Thursday: 9:00am - 6:00pm Friday: 9:00am - 5:00pm Saturday: 9:00am - 1:00pm Sunday: Closed  -[facility] [place]) [address] [place], [place] [address] [phone]  [place] [phone] [address] [place], [place] [address]  M-W    5:00am-12:00pm Thu     5:00am-10:00am Fri       5:00am-12:00pm Sat      5:00am-8:00am Sun     Closed  $12/daily for Methadone Treatment. In a time of Crisis: [facility]  Mobile Crisis Management provides immediate crisis response, 24/7.  Call [phone]  [facility] for MH/DD/SA [facility] is available 24 hours a day, 7 days a week. Customer Service Specialists will assist you to find a crisis provider that is well-matched with your needs. Your local number is: [phone]  [facility] Center/Behavioral Health Urgent Care (BHUC) IOP, individual counseling, medication management [address] [place], [place] [address] [phone] Call for intake hours; Medicaid and Uninsured    Outpatient Providers  [facility] ([facility]) Group and individual counseling. [address]  [place], [place] [address] [phone] [place]: [phone]  [place]: [phone] Medicaid and uninsured.   [facility] Offers IOP groups multiple times per week. [address] Sherian [place], [place] [address] [phone] Takes Medicaid and other insurances.   [facility]  Chemical  Dependency Intensive Outpatient Program (IOP) [address] [place], [place] [address] [phone] Takes Nurse, learning disability and [place].   [facility]  IOP and Partial Hospitalization Program  [address]  [place], [place] [address] [phone] Private Insurance, [place] only for partial hospitalization  [facility] [address], [place], [place] [address] [phone] Monday-Friday. Short and Long term options. [facility]al Health Urgent Care (BHUC) IOP, individual counseling, medication management [address] [place], [place] [address] [phone] Medicaid and [facility]  [facility] [address]  [place], [place] [address] [phone] Private Insurance and Self Pay   [facility] [address]  [place], [place] [address] [phone] Private Insurance, [place], and Self Pay   [facility]  [address] [place], [place] [address] [facility]  [address]  [place], [place] [address] [phone]  [facility]  [address] [place], [place] [address] [phone]  [facility] [phone] Marcy Panning and [facility]      Residential Treatment Programs  [facility] [address] [place], [place] [address] [phone] or [phone] Detox and Residential Rehab 14 days (Medicare, Medicaid, private insurance, and self pay). No methadone. Call for pre-screen.   [facility]) [address]  [place], [place] [address] [phone] Detox (self Pay and Medicaid Limited availability) Rehab Only Male (Medicare, Medicaid, and Self Pay)-No methadone.  [facility] [address] [place], [place] [address] [phone] or [phone] Private Insurance only  [facility] [place] [facility] [address] [place], [place] [address] Phone:  [phone] Must be detoxed 72 hours prior to admission; 28 day program.  Self-pay.  [facility] [address]  [place], [place] [phone] [employer],  Medicare, Medicaid (not straight Medicaid). They offer assistance with transportation.   [facility] [address] [place],  [place], [place] [address] [phone] Christian Based Program. Men only. No insurance  [facility] [address] [place], [place] [address] Women's: [phone] Men's: [phone] No Medicaid.   [facility] [place] Locations across the [place]mainly [place] willing to help with transportation.  [phone] [employer]. [facility]  [address]  [place], [place] [address] [phone] Treatment Only, must make assessment appointment, and must be sober for assessment appointment. Self pay, Medicare A and B, [facility], must be [facility] resident. No methadone.   [address] [place], [place] [address] Phone: [phone] Inpatient 24/7 and outpatient services. Individuals with Medicaid have no obligation for a copay. Individuals with Medicare or private insurance will be obligated to meet their policy's requirement(s). Individuals who are uninsured will be eligible for a sliding or discounted scaled  [facility]  [address] [place], [place] [address] [phone] No pending legal charges, Long-term work program. No methadone. Call for assessment.  [facility]  [address], [place], [place] [address] [phone] or [phone] Commercial Insurance Only  [facility] - [phone] Private Insurance (no [place]). Males/Females, call to make referrals, multiple facilities   [facility] [address],  [place], [place] [address]  [phone] Men Only Upfront Fee   [facility]one [facility] [address] [place], [place] [address] [phone] (f)         AA Meetings Website to locate meetings (virtually or in person): [email or web address]/ Phone: [phone]  Syringe Services Program: Due to COVID-19, syringe services programs are likely operating under different hours with limited or no fixed site hours. Some programs may not be operating at all. Please contact the program directly using the phone numbers provided below to see if they are still operating under COVID-19.  [facility] Solution to the Opioid Problem (GCSTOP) Fixed; mobile; peer-based Roxy Cedar [phone] [email or web address] Fixed site exchange at [facility], [address] [place], [place] [address] on Wednesdays (2:00 - 5:00 pm) and Thursdays (4:00 - 8:00 pm). Pop-up mobile exchange locations: [employer] and [employer], [address] [place], [place] [address] on Tuesdays (11:00 am - 1:00 pm) and Fridays (11:00 am - 1:00 pm)  -[facility] - [address], [place], [place] [address] on Tuesdays (2:00 - 4:00 pm) and Fridays (2:00 - 4:00 pm)  -[facility] [facility] copy - also serves Radio broadcast assistant and [employer] [place] [employer]; Fixed; mobile; peer-based; Lendon Ka [phone] [email or web address] [address], [place], [place] [address] Delivery and outreach available in [place] and [place], please call for more information. Monday, Tuesday: 1:00 -7:00 pm, Thursday: 4:00 pm - 8:00 pm or Friday: 1:00 pm - 8:00 pm  Medication-Assisted Treatment (MAT):  -[facility] services [address] and surrounding areas including [place], [place], [place], [place], [address], [place], [place], [place], [place], and [place], [place]. Options include Methadone, buprenorphine or Suboxone. [address], [place]  [place], [place] [address] Phone: [phone] Mon - Fri: 5:30am - 2:00pm Sat: 5:30am -7:30am Sun: Closed Holidays: 6:00am - 8:00am  -[facility]- We use FDA-approved medications, like methadone/suboxone/sublocade, and vivitrol. These medications are then combined with customized care plans that include individual or group counseling, toxicology, and medical care directed by on-site  physicians. Accepts most insurance plans, Medicaid, and private pay.  [address] [place], [place] [address] Phone: [phone] Monday-Friday 5:00 AM - 10:00 AM Saturday 6:00 AM - 8:30 AM Sunday 6:00 AM - 7:00 AM  -[facility]- [facility] is a treatment & recovery focused program. In addition to receiving methadone medication, our clients participate in individual and group counseling as well as random drug testing. If accepted into the [facility] Opioid Program, you will be provided several intake appointments and a physical exam [address] [place], [place] [address] Office: [phone]  Fax: [phone]  -[facility]- We put our community members at the center of everything we do, for remote treatment services as well as in-person, from alcohol withdrawal to opioid use and more.  [address], [place], [place] [address] [phone] Monday-Wednesday: 9:00am - 5:00pm Thursday: 9:00am - 6:00pm Friday: 9:00am - 5:00pm Saturday: 9:00am - 1:00pm Sunday: Closed  -[facility] [place]) [address] [place], [place] [address] [phone]  [place] [phone] [address] [place], [place] [address]  M-W    5:00am-12:00pm Thu     5:00am-10:00am Fri       5:00am-12:00pm Sat      5:00am-8:00am Sun     Closed  $12/daily for Methadone Treatment.

## 2022-09-24 NOTE — H&P (Signed)
Physical Medicine and Rehabilitation Admission H&P    CC: Functional deficits    HPI: Derek Oconnell is a 67 year old male with history of CAD, PAD s/p stents, ETOH abuse (drinks 1.5 L bottle of bourbon/4-5 days) who was admitted on 09/15/22 via [facility] with MS changes and seizure type activity. . Family reported  3 day history of illness with decrease intake, diarrhea and decrease in ETOH use. He was felt to have status epilepticus due to ETOH withdrawal. He had seizure activity in ED, was loaded with Keppra, intubated and transferred to [facility] for management.  He was placed on pressors for hypotension, IV antibiotics added due to concerns of sepsis with elevated WBC and metabolic acidosis. He was also noted to have significant hypocalcemia Ca-5.1, hypomagnesemia Mg-1.2 and hypokalemia K-2.7 and low Mg 1.2 which was supplemented.  CT head negative for acute changes. LTM-EEG showed severe diffuse encephalopathy. He was weaned off versed and anti-seizure meds held to monitor for ictal-interictal abnormality.  He was weaned off vent and no seizure type events noted. Anemia and thrombocytopenia felt to be ETOH related. Vitamin B12 low normal and extremely low thiamine level @ 1.1 supplemented parentally X 5 days and transitioned to oral supplement.   MRI brain performed once stable and showed right parietal lobe infarcts. CTA head/neck was negative for LVO and showed incidental finding of R>L pleural effusions with RUL ground glass opacity concerning for infectious inflammatory process.  He was started on Unasyn for aspiration PNA on 09/07 due to low grade fevers with rise in LA. He was treated with CIWA protocol for ETOH withdrawal and weaned off pressors and hypotension felt to be at baseline with SBP 90-100 range.  2D echo showed EF 60-65% with G2DD. He was on DAPT PTA and changed to ASA daily due to low platelets. Dr. Pearlean Brownie felt that stroke likely cardioembolic  but not the best candidate due to heavy ETOH  use and fall risk. This was discussed with wife and PCCM and 30 day cardiac monitor recommended at discharge. He was kept NPO with tube feeds due to ongoing bouts of lethargy with dysphonia and signs of dysphagia. MBS 09/10 with prolonged mastication but without signs of aspiration and diet advanced to regular today with tube feeds at nights for severe malnutrition.  He is noted to be severely deconditioned with HR upto 140s with minimal activity. He is limited by weakness, cognitive deficits with delay in processing and requires min assist for pregait activity. CIR recommended due to functional decline.    Review of Systems  Constitutional:  Negative for chills and fever.  HENT:  Negative for hearing loss and tinnitus.   Eyes:  Negative for blurred vision and double vision.  Respiratory:  Negative for cough and sputum production.   Cardiovascular:  Negative for chest pain and palpitations.  Gastrointestinal:  Negative for abdominal pain.  Genitourinary:  Negative for dysuria.  Musculoskeletal:  Positive for falls. Negative for myalgias.  Neurological:  Positive for weakness. Negative for dizziness and headaches.  Psychiatric/Behavioral:  Positive for memory loss. The patient does not have insomnia.      Past Medical History:  Diagnosis Date   Myocardial infarction [facility]) 1989    Past Surgical History:  Procedure Laterality Date   FEMORAL-POPLITEAL BYPASS GRAFT Bilateral 2021   GALLBLADDER SURGERY  2019   VASECTOMY  1992    Family History  Problem Relation Age of Onset   Dementia Mother    Congestive Heart Failure Father  Emphysema Father    Bladder Cancer Paternal Grandfather     Social History:  Married. 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 He  reports that he has been smoking cigarettes about 1/2 PPD.  He uses smokeless tobacco. He reports current alcohol use. No history on file for drug use.   Allergies: No Known Allergies   Medications Prior to  Admission  Medication Sig Dispense Refill   aspirin EC 81 MG tablet Take 81 mg by mouth daily. Swallow whole.     atorvastatin (LIPITOR) 80 MG tablet Take 80 mg by mouth daily.     buPROPion (WELLBUTRIN XL) 150 MG 24 hr tablet Take 150 mg by mouth daily.     clopidogrel (PLAVIX) 75 MG tablet Take 75 mg by mouth daily.     folic acid (FOLVITE) 1 MG tablet Take 1 mg by mouth daily.     loperamide (IMODIUM A-D) 2 MG tablet Take 2 mg by mouth as needed for diarrhea or loose stools.     MAGNESIUM PO Take 1 tablet by mouth daily.     metoprolol succinate (TOPROL-XL) 25 MG 24 hr tablet Take 12.5 mg by mouth daily.     mirtazapine (REMERON) 15 MG tablet Take 30 mg by mouth at bedtime.     nitroGLYCERIN (NITROSTAT) 0.4 MG SL tablet Place 0.4 mg under the tongue every 5 (five) minutes as needed for chest pain.     pantoprazole (PROTONIX) 40 MG tablet Take 40 mg by mouth daily.     potassium chloride SA (KLOR-CON M) 20 MEQ tablet Take 20 mEq by mouth daily.     thiamine (VITAMIN B1) 100 MG tablet Take 100 mg by mouth daily.     lisinopril (ZESTRIL) 2.5 MG tablet Take 2.5 mg by mouth daily. (Patient not taking: Reported on 09/16/2022)        Home: Home Living Family/patient expects to be discharged to:: Private residence Living Arrangements: Spouse/significant other Available Help at Discharge: Family, Available 24 hours/day Type of Home: House Home Access: Stairs to enter [employer] of Steps: 7 Home Layout: Multi-level, Bed/bath upstairs Alternate Level Stairs-Number of Steps: flight Bathroom Shower/Tub: Associate Professor: Yes Home Equipment: None  Lives With: Spouse   Functional History: Prior Function Prior Level of Function : Independent/Modified Independent Mobility Comments: no AD ADLs Comments: retired, drives  Functional Status:  Mobility: Bed Mobility Overal bed mobility: Needs Assistance Bed Mobility: Rolling,  Sidelying to Sit Rolling: Min assist Sidelying to sit: Mod assist, Used rails, HOB elevated Supine to sit: Mod assist Sit to supine: Mod assist Sit to sidelying: Min assist, HOB elevated, Used rails General bed mobility comments: assisting with rolling and raising trunk Transfers Overall transfer level: Needs assistance Equipment used: Rolling walker (2 wheels) Transfers: Sit to/from Stand, Bed to chair/wheelchair/BSC Sit to Stand: Min assist, From elevated surface Bed to/from chair/wheelchair/BSC transfer type:: Step pivot Step pivot transfers: Mod assist, From elevated surface General transfer comment: mod assist to steady and assistance with RW with cues for hand placement and safety Ambulation/Gait Ambulation/Gait assistance: Min assist Gait Distance (Feet): 4 Feet Assistive device: Rolling walker (2 wheels) Gait Pattern/deviations: Step-to pattern, Trunk flexed, Narrow base of support General Gait Details: HR 143 bpm with dynamic standing tasks so defer longer distance, pt also c/o fatigue after recently getting up ~10 mins prior to NT/RN assist for toileting. Gait velocity interpretation: <1.8 ft/sec, indicate of risk for recurrent falls Pre-gait activities: standing hip flexion  x10 reps ea with high knees at RW    ADL: ADL Overall ADL's : Needs assistance/impaired Eating/Feeding: NPO Eating/Feeding Details (indicate cue type and reason): cortrak Grooming: Wash/dry hands, Wash/dry face, Brushing hair, Set up, Sitting Grooming Details (indicate cue type and reason): in recliner Upper Body Bathing: Set up, Sitting Lower Body Bathing: Maximal assistance, Sit to/from stand Lower Body Bathing Details (indicate cue type and reason): assist with cleaning following bowel incontenence in bed Upper Body Dressing : Set up, Sitting Upper Body Dressing Details (indicate cue type and reason): gown change Lower Body Dressing: Moderate assistance, Sitting/lateral leans Lower Body Dressing  Details (indicate cue type and reason): change socks Toilet Transfer: Minimal assistance, Stand-pivot, Rolling walker (2 wheels), BSC/3in1 Toileting- Clothing Manipulation and Hygiene: Maximal assistance, Sit to/from stand Toileting - Clothing Manipulation Details (indicate cue type and reason): requires BUE support Functional mobility during ADLs: Minimal assistance, Rolling walker (2 wheels) General ADL Comments: poor activity tolerance, weakness, poor insight, incontinent stool  Cognition: Cognition Overall Cognitive Status: Impaired/Different from baseline Arousal/Alertness: Awake/alert Orientation Level: Oriented to person, Oriented to place, Disoriented to time Attention: Sustained Sustained Attention: Impaired Sustained Attention Impairment: Verbal basic Memory: Impaired Memory Impairment: Storage deficit Awareness: Impaired Awareness Impairment: Intellectual impairment Safety/Judgment: Impaired Cognition Arousal: Alert Behavior During Therapy: WFL for tasks assessed/performed Overall Cognitive Status: Impaired/Different from baseline Area of Impairment: Memory, Following commands, Safety/judgement, Awareness, Problem solving Orientation Level: Disoriented to, Time Current Attention Level: Sustained Memory: Decreased recall of precautions, Decreased short-term memory Following Commands: Follows one step commands consistently Safety/Judgement: Decreased awareness of safety, Decreased awareness of deficits Awareness: Emergent Problem Solving: Slow processing, Difficulty sequencing, Requires verbal cues General Comments: appeared unaware he had bowel incontenence in bed, good participation, following directions   Blood pressure 103/69, pulse (!) 109, temperature 98 F (36.7 C), temperature source Oral, resp. rate 20, height 5\' 10"  (1.778 m), weight 57.4 kg, SpO2 97%. Physical Exam Vitals and nursing note reviewed.  Constitutional:      General: He is not in acute  distress.    Comments: Frail appearing with occasional congested cough.   HENT:     Head: Normocephalic.     Nose: Nose normal.     Comments: NGT in place Eyes:     Extraocular Movements: Extraocular movements intact.     Pupils: Pupils are equal, round, and reactive to light.  Cardiovascular:     Rate and Rhythm: Normal rate and regular rhythm.     Heart sounds: No murmur heard.    No gallop.  Pulmonary:     Effort: Pulmonary effort is normal. No respiratory distress.     Breath sounds: Rhonchi present.  Abdominal:     General: Bowel sounds are normal. There is no distension.     Palpations: Abdomen is soft.  Genitourinary:    Comments: Incontinent of stool Musculoskeletal:        General: No swelling or tenderness. Normal range of motion.     Cervical back: Normal range of motion.  Skin:    Findings: Bruising (BUE) present.     Comments: Healed excoriated areas bilateral knees, areas of breakdown back and buttocks, clean  Neurological:     Mental Status: He is alert and oriented to person, place, and time.     Comments: Alert and oriented to name, place, month, year, date, missed day by one. Reasonable insight and awareness. Mild dysphonia. He was able to follow simple commands without difficulty. Slow to process and easily distracted. RUE  4+/5. LUE 4/5 prox to distal. RLE 4-/5 prox to 4/5 distally. LLE 3+/5 prox to 4/5 distally. No focal sensory findings. Normal muscle tone.  Psychiatric:     Comments: Pleasant, a little eccentric but cooperative     Results for orders placed or performed during the hospital encounter of 09/15/22 (from the past 48 hour(s))  Glucose, capillary     Status: Abnormal   Collection Time: 09/22/22  4:24 PM  Result Value Ref Range   Glucose-Capillary 131 (H) 70 - 99 mg/dL    Comment: Glucose reference range applies only to samples taken after fasting for at least 8 hours.  Glucose, capillary     Status: Abnormal   Collection Time: 09/22/22  9:07  PM  Result Value Ref Range   Glucose-Capillary 114 (H) 70 - 99 mg/dL    Comment: Glucose reference range applies only to samples taken after fasting for at least 8 hours.  Basic metabolic panel     Status: Abnormal   Collection Time: 09/23/22  6:47 AM  Result Value Ref Range   Sodium 140 135 - 145 mmol/L   Potassium 4.7 3.5 - 5.1 mmol/L   Chloride 110 98 - 111 mmol/L   CO2 21 (L) 22 - 32 mmol/L   Glucose, Bld 129 (H) 70 - 99 mg/dL    Comment: Glucose reference range applies only to samples taken after fasting for at least 8 hours.   BUN 43 (H) 8 - 23 mg/dL   Creatinine, Ser 1.61 (H) 0.61 - 1.24 mg/dL   Calcium 8.2 (L) 8.9 - 10.3 mg/dL   GFR, Estimated >09 >60 mL/min    Comment: (NOTE) Calculated using the CKD-EPI Creatinine Equation (2021)    Anion gap 9 5 - 15    Comment: Performed at [facility], [address] [place], [place] [address]  CBC with Differential/Platelet     Status: Abnormal   Collection Time: 09/23/22  6:47 AM  Result Value Ref Range   WBC 7.6 4.0 - 10.5 K/uL   RBC 2.03 (L) 4.22 - 5.81 MIL/uL   Hemoglobin 7.0 (L) 13.0 - 17.0 g/dL   HCT 81.1 (L) 91.4 - 78.2 %   MCV 103.9 (H) 80.0 - 100.0 fL   MCH 34.5 (H) 26.0 - 34.0 pg   MCHC 33.2 30.0 - 36.0 g/dL   RDW 95.6 (H) 21.3 - 08.6 %   Platelets 190 150 - 400 K/uL   nRBC 0.0 0.0 - 0.2 %   Neutrophils Relative % 76 %   Neutro Abs 5.8 1.7 - 7.7 K/uL   Lymphocytes Relative 9 %   Lymphs Abs 0.7 0.7 - 4.0 K/uL   Monocytes Relative 8 %   Monocytes Absolute 0.6 0.1 - 1.0 K/uL   Eosinophils Relative 6 %   Eosinophils Absolute 0.4 0.0 - 0.5 K/uL   Basophils Relative 0 %   Basophils Absolute 0.0 0.0 - 0.1 K/uL   Immature Granulocytes 1 %   Abs Immature Granulocytes 0.04 0.00 - 0.07 K/uL    Comment: Performed at [facility], [address] [place], [place] [address]  Magnesium     Status: None   Collection Time: 09/23/22  6:47 AM  Result Value Ref Range   Magnesium 1.7 1.7 - 2.4 mg/dL    Comment:  Performed at [facility], [address], [place], [place] [address]  ABO/Rh     Status: None   Collection Time: 09/23/22  6:47 AM  Result Value Ref Range   ABO/RH(D)      A POS Performed at [facility], [address] [place], [place] [address]   Glucose, capillary     Status: Abnormal   Collection Time: 09/23/22  8:51 AM  Result Value Ref Range   Glucose-Capillary 135 (H) 70 - 99 mg/dL    Comment: Glucose reference range applies only to samples taken after fasting for at least 8 hours.  Glucose, capillary     Status: Abnormal   Collection Time: 09/23/22 12:18 PM  Result Value Ref Range   Glucose-Capillary 108 (H) 70 - 99 mg/dL    Comment: Glucose reference range applies only to samples taken after fasting for at least 8 hours.  Glucose, capillary     Status: Abnormal   Collection Time: 09/23/22  4:21 PM  Result Value Ref Range   Glucose-Capillary 138 (H) 70 - 99 mg/dL    Comment: Glucose reference range applies only to samples taken after fasting for at least 8 hours.  Glucose, capillary     Status: Abnormal   Collection Time: 09/23/22  8:36 PM  Result Value Ref Range   Glucose-Capillary 117 (H) 70 - 99 mg/dL    Comment: Glucose reference range applies only to samples taken after fasting for at least 8 hours.  Glucose, capillary     Status: Abnormal   Collection Time: 09/24/22 12:41 AM  Result Value Ref Range   Glucose-Capillary 127 (H) 70 - 99 mg/dL    Comment: Glucose reference range applies only to samples taken after fasting for at least 8 hours.  Phosphorus     Status: Abnormal   Collection Time: 09/24/22  3:58 AM  Result Value Ref Range   Phosphorus 1.4 (L) 2.5 - 4.6 mg/dL    Comment: Performed at [facility], [address] [place], [place] [address]  Vitamin B12     Status: None   Collection Time: 09/24/22  3:58 AM  Result Value Ref Range   Vitamin B-12 258 180 - 914 pg/mL    Comment: (NOTE) This assay is not validated for testing neonatal  or myeloproliferative syndrome specimens for Vitamin B12 levels. Performed at [facility], [address] [place], [place] [address]   Folate     Status: None   Collection Time: 09/24/22  3:58 AM  Result Value Ref Range   Folate 13.4 >5.9 ng/mL    Comment: Performed at [facility], [address] [place], [place] [address]  Iron and TIBC     Status: Abnormal   Collection Time: 09/24/22  3:58 AM  Result Value Ref Range   Iron 28 (L) 45 - 182 ug/dL   TIBC 628 (L) 315 - 176 ug/dL   Saturation Ratios 19 17.9 - 39.5 %   UIBC 119 ug/dL    Comment: Performed at [facility], [address] [place], [place] [address]  Ferritin     Status: Abnormal   Collection Time: 09/24/22  3:58 AM  Result Value Ref Range   Ferritin 2,896 (H) 24 - 336 ng/mL    Comment: Performed at [facility], [address] [place], [place] [address]  Reticulocytes     Status: Abnormal   Collection Time: 09/24/22  3:58 AM  Result Value Ref Range   Retic Ct Pct 2.3 0.4 - 3.1 %   RBC. 2.04 (L) 4.22 - 5.81 MIL/uL   Retic Count, Absolute 46.7 19.0 -  186.0 K/uL   Immature Retic Fract 20.7 (H) 2.3 - 15.9 %    Comment: Performed at [facility], [address], [place], [place] [address]  CBC     Status: Abnormal   Collection Time: 09/24/22  3:58 AM  Result Value Ref Range   WBC 7.4 4.0 - 10.5 K/uL   RBC 2.03 (L) 4.22 - 5.81 MIL/uL   Hemoglobin 6.9 (LL) 13.0 - 17.0 g/dL    Comment: REPEATED TO VERIFY THIS CRITICAL RESULT HAS VERIFIED AND BEEN CALLED TO R CAIOLISTA RN BY JALEESA WHITE ON 09 12 2024 AT 0438, AND HAS BEEN READ BACK.     HCT 21.3 (L) 39.0 - 52.0 %   MCV 104.9 (H) 80.0 - 100.0 fL   MCH 34.0 26.0 - 34.0 pg   MCHC 32.4 30.0 - 36.0 g/dL   RDW 60.4 (H) 54.0 - 98.1 %   Platelets 244 150 - 400 K/uL   nRBC 0.0 0.0 - 0.2 %    Comment: Performed at [facility], [address] [place], [place] [address]  Basic metabolic panel     Status: Abnormal   Collection Time:  09/24/22  3:58 AM  Result Value Ref Range   Sodium 139 135 - 145 mmol/L   Potassium 5.2 (H) 3.5 - 5.1 mmol/L   Chloride 112 (H) 98 - 111 mmol/L   CO2 21 (L) 22 - 32 mmol/L   Glucose, Bld 133 (H) 70 - 99 mg/dL    Comment: Glucose reference range applies only to samples taken after fasting for at least 8 hours.   BUN 44 (H) 8 - 23 mg/dL   Creatinine, Ser 8.29 (H) 0.61 - 1.24 mg/dL   Calcium 8.3 (L) 8.9 - 10.3 mg/dL   GFR, Estimated 55 (L) >60 mL/min    Comment: (NOTE) Calculated using the CKD-EPI Creatinine Equation (2021)    Anion gap 6 5 - 15    Comment: Performed at [facility], [address] [place], [place] [address]  Glucose, capillary     Status: Abnormal   Collection Time: 09/24/22  7:24 AM  Result Value Ref Range   Glucose-Capillary 119 (H) 70 - 99 mg/dL    Comment: Glucose reference range applies only to samples taken after fasting for at least 8 hours.  Type and screen [facility]     Status: None (Preliminary result)   Collection Time: 09/24/22  9:30 AM  Result Value Ref Range   ABO/RH(D) A POS    Antibody Screen NEG    Sample Expiration 09/27/2022,2359    Unit Number [id number]    Blood Component Type RED CELLS,LR    Unit division 00    Status of Unit ISSUED    Transfusion Status OK TO TRANSFUSE    Crossmatch Result      Compatible Performed at [facility], [address], [place], [place] [address]   Prepare RBC (crossmatch)     Status: None   Collection Time: 09/24/22  9:30 AM  Result Value Ref Range   Order Confirmation      ORDER PROCESSED BY BLOOD BANK Performed at [facility], [address] [place], [place] [address]   Glucose, capillary     Status: Abnormal   Collection Time: 09/24/22 11:25 AM  Result Value Ref Range   Glucose-Capillary 132 (H) 70 - 99 mg/dL    Comment: Glucose reference range applies only to samples taken after  fasting for at least 8 hours.   No results found.    Blood pressure  103/69, pulse (!) 109, temperature 98 F (36.7 C), temperature source Oral, resp. rate 20, height 5\' 10"  (1.778 m), weight 57.4 kg, SpO2 97%.  Medical Problem List and Plan: 1. Functional deficits secondary to right parietal lobe infarcts  -patient may  shower  -ELOS/Goals: 7-10+ days, supervision goals with PT, OT, SLP 2.  Antithrombotics: -DVT/anticoagulation:  Pharmaceutical: Lovenox  -antiplatelet therapy: ASA/ 3. Pain Management: Tylenol prn.  4. Mood/Behavior/Sleep: LCSW to follow for evaluation and support.   -antipsychotic agents: N/A  -wife is interested in neuropsych counseling for ?depression 5. Neuropsych/cognition: This patient is not fully capable of making decisions on his own behalf. 6. Skin/Wound Care: Air mattress for pressure relief and management of MASD/DTPI  --tube feeds and Prostat for malnutrition.   --Vitamin C added.  7. Fluids/Electrolytes/Nutrition: Monitor I/O. Continue tube feeds at nights  --monitor for signs of overload.  8. Aspiration PNA: Sputum positive for Staph Lugdunensis. Augmentin thru 09/14 to complete 7 day course. 9. ETOH abuse w/withdrawal seizures: wife interested in ETOH counseling.   -discussed that majority of etoh counseling would be after his rehab stay 10. Acute on chronic anemia: Hgb has trended down form mid 8's to 6.9 on 9/12-->transfused with 1 unit PRBC  --will order FOBT and monitor for other signs of bleeding.  11.  Sever thiamine deficiency @ 1.1: Supplemented parentally X 5 days. Continue oral supplement.  12. H/o Chronic HFrEF w/recovery. S/p BIV ICD: Continues to have soft BP limiting GDMT.  --strict I/O. Daily wts to monitor for signs of overload.  Continue ASA/Statin.  --continue to hold Lisinopril and metoprolol. Monitor HR/for symptoms with increase with activity.  13. PAD s/p stent/aortobifemoral BG: On ASA and statin. Off Plavix due to low platelets/anemia.  14. Macrocytic anemia/ Low normal B12 @ 258: Started on  supplement today. .   15. Dysphagia/chronic anorexia: Per family eats once a day.  Diet upgraded to regular  --continue on tube feeds at nights.  16. CKD?: SCr- 2.16 @ admission. Hyperkalemia today treated with lokelma --will change tube feeds to nephro due to worsening of renal status--BUN/SCr 44/1.4/K-5.2 --on water flushes 300 cc/QID. Strict I/O.   17. Magnesium/Phosphorous: trending down-->recheck in am      Jacquelynn Cree, PA-C 09/24/2022

## 2022-09-24 NOTE — Progress Notes (Signed)
Physical Therapy Treatment Patient Details Name: Derek Oconnell MRN: [id number] DOB: February 17, 1955 Today's Date: 09/24/2022   History of Present Illness 67 y.o. male admitted 9/3 with unresponsive episode at home with seizure like activity. Intubated 9/3-9/5. 9/4 EEG with encephalopathy. 9/6 acute cortical infarcts R parietal lobe.  PMhx: HTN, HFrEF with EF 25-30%, CAD, COPD, HTN, biventricular ICD, heavy EtOH use    PT Comments  Pt received in chair, agreeable to therapy session with emphasis on transfer/gait training at bedside and positioning for pressure relief. Pt mildly impulsive but with slightly improved standing tolerance for gait task at bedside with RW and minA this date and HR slightly less elevated with mobility today. Pt awaiting new IV to be placed to finish his blood transfusion so returned to supine so IV team assist him. Pt continues to benefit from PT services to progress toward functional mobility goals, Supervising PT notified pt agreeable to change in recommendation to post-acute PT >3 hours as he is now tolerating seated/standing activity more with improving VS.   If plan is discharge home, recommend the following: A lot of help with walking and/or transfers;A lot of help with bathing/dressing/bathroom;Assistance with cooking/housework;Direct supervision/assist for medications management;Assist for transportation;Supervision due to cognitive status;Assistance with feeding;Direct supervision/assist for financial management;Help with stairs or ramp for entrance   Can travel by private vehicle     Yes  Equipment Recommendations  Rolling walker (2 wheels);BSC/3in1;Wheelchair (measurements PT)    Recommendations for Other Services       Precautions / Restrictions Precautions Precautions: Fall;Other (comment) Precaution Comments: cortrak, watch HR Restrictions Weight Bearing Restrictions: No     Mobility  Bed Mobility Overal bed mobility: Needs Assistance Bed Mobility: Sit  to Sidelying         Sit to sidelying: Min assist, HOB elevated, Used rails General bed mobility comments: assisting with rolling and elevating BLE over bed. Pt requesting to remain sidelying toward his L, pillow behind back/hips to offload pressure    Transfers Overall transfer level: Needs assistance Equipment used: Rolling walker (2 wheels) Transfers: Sit to/from Stand, Bed to chair/wheelchair/BSC Sit to Stand: Min assist, From elevated surface   Step pivot transfers: Mod assist, From elevated surface       General transfer comment: mod assist to steady and assistance with RW with cues for hand placement and safety. chair>BSC and BSC> EOB    Ambulation/Gait Ambulation/Gait assistance: Min Chemical engineer (Feet): 6 Feet Assistive device: Rolling walker (2 wheels) Gait Pattern/deviations: Step-to pattern, Trunk flexed, Narrow base of support       General Gait Details: HR 130's bpm; a few feet to BSC from chair then after clean-up, ~6-27ft back from [facility] to EOB. Mod cues for posture, proximity to RW and manual assist needed to manage AD.   Stairs             Wheelchair Mobility     Tilt Bed    Modified Rankin (Stroke Patients Only) Modified Rankin (Stroke Patients Only) Pre-Morbid Rankin Score: No significant disability Modified Rankin: Moderately severe disability     Balance Overall balance assessment: Needs assistance Sitting-balance support: Feet supported, No upper extremity supported Sitting balance-Leahy Scale: Fair Sitting balance - Comments: seated EOB   Standing balance support: Bilateral upper extremity supported, During functional activity, Reliant on assistive device for balance Standing balance-Leahy Scale: Poor Standing balance comment: reliant on UE support  Cognition Arousal: Alert Behavior During Therapy: WFL for tasks assessed/performed, Impulsive Overall Cognitive Status: Impaired/Different  from baseline Area of Impairment: Memory, Following commands, Safety/judgement, Awareness, Problem solving                   Current Attention Level: Sustained Memory: Decreased recall of precautions, Decreased short-term memory Following Commands: Follows one step commands consistently Safety/Judgement: Decreased awareness of safety, Decreased awareness of deficits Awareness: Emergent Problem Solving: Slow processing, Difficulty sequencing, Requires verbal cues General Comments: Decreased safety with cords/lines, impulsive, good participation, following directions.        Exercises General Exercises - Lower Extremity Ankle Circles/Pumps: AROM, Both, 10 reps, Supine Long Arc Quad: AROM, Both, 5 reps, Seated Hip Flexion/Marching: AROM, Both, 5 reps, Standing    General Comments General comments (skin integrity, edema, etc.): HR 130's bpm; SpO2 WFL on RA; small reddish area over mid-back, RN notified, foam dressing placed for comfort; pillow to offload pressure once returned to bed; HOB >30* due to tube feeds running      Pertinent Vitals/Pain Pain Assessment Pain Assessment: Faces Faces Pain Scale: Hurts little more Pain Location: bottom from chair Pain Descriptors / Indicators: Discomfort, Grimacing Pain Intervention(s): Limited activity within patient's tolerance, Monitored during session, Repositioned, Other (comment) (foam dressing over reddish spot on mid-back (irritation per spouse from chafing/curved back in this area))    Home Living                          Prior Function            PT Goals (current goals can now be found in the care plan section) Acute Rehab PT Goals Patient Stated Goal: return home PT Goal Formulation: With patient Time For Goal Achievement: 10/02/22 Progress towards PT goals: Progressing toward goals    Frequency    Min 1X/week      PT Plan      Co-evaluation              AM-PAC PT "6 Clicks" Mobility    Outcome Measure  Help needed turning from your back to your side while in a flat bed without using bedrails?: A Little Help needed moving from lying on your back to sitting on the side of a flat bed without using bedrails?: A Lot Help needed moving to and from a bed to a chair (including a wheelchair)?: A Lot Help needed standing up from a chair using your arms (e.g., wheelchair or bedside chair)?: A Little Help needed to walk in hospital room?: Total Help needed climbing 3-5 steps with a railing? : Total 6 Click Score: 12    End of Session Equipment Utilized During Treatment: Gait belt Activity Tolerance: Patient tolerated treatment well;Treatment limited secondary to medical complications (Comment);Other (comment) (bowel urgency limiting standing tolerance) Patient left: in bed;with call bell/phone within reach;with bed alarm set;with family/visitor present;Other (comment) (pillows between legs and behind mid-back for comfort, semi-sidelying) Nurse Communication: Mobility status PT Visit Diagnosis: Other abnormalities of gait and mobility (R26.89);Muscle weakness (generalized) (M62.81);Difficulty in walking, not elsewhere classified (R26.2)     Time: 4098-1191 PT Time Calculation (min) (ACUTE ONLY): 26 min  Charges:    $Gait Training: 8-22 mins $Therapeutic Activity: 8-22 mins PT General Charges $$ ACUTE PT VISIT: 1 Visit                     Somalia Segler P., PTA Acute Rehabilitation Services Secure Chat Preferred 9a-5:30pm  Office: [phone]    Dorathy Kinsman Lynora Dymond 09/24/2022, 5:40 PM

## 2022-09-24 NOTE — H&P (Signed)
Physical Medicine and Rehabilitation Admission H&P     CC: Functional deficits      HPI: Derek Oconnell is a 67 year old male with history of CAD, PAD s/p stents, ETOH abuse (drinks 1.5 L bottle of bourbon/4-5 days) who was admitted on 09/15/22 via [facility] with MS changes and seizure type activity. . Family reported  3 day history of illness with decrease intake, diarrhea and decrease in ETOH use. He was felt to have status epilepticus due to ETOH withdrawal. He had seizure activity in ED, was loaded with Keppra, intubated and transferred to [facility] for management.  He was placed on pressors for hypotension, IV antibiotics added due to concerns of sepsis with elevated WBC and metabolic acidosis. He was also noted to have significant hypocalcemia Ca-5.1, hypomagnesemia Mg-1.2 and hypokalemia K-2.7 and low Mg 1.2 which was supplemented.  CT head negative for acute changes. LTM-EEG showed severe diffuse encephalopathy. He was weaned off versed and anti-seizure meds held to monitor for ictal-interictal abnormality.  He was weaned off vent and no seizure type events noted. Anemia and thrombocytopenia felt to be ETOH related. Vitamin B12 low normal and extremely low thiamine level @ 1.1 supplemented parentally X 5 days and transitioned to oral supplement.    MRI brain performed once stable and showed right parietal lobe infarcts. CTA head/neck was negative for LVO and showed incidental finding of R>L pleural effusions with RUL ground glass opacity concerning for infectious inflammatory process.  He was started on Unasyn for aspiration PNA on 09/07 due to low grade fevers with rise in LA. He was treated with CIWA protocol for ETOH withdrawal and weaned off pressors and hypotension felt to be at baseline with SBP 90-100 range.  2D echo showed EF 60-65% with G2DD. He was on DAPT PTA and changed to ASA daily due to low platelets. Dr. Pearlean Brownie felt that stroke likely cardioembolic  but not the best candidate due to heavy ETOH use  and fall risk. This was discussed with wife and PCCM and 30 day cardiac monitor recommended at discharge. He was kept NPO with tube feeds due to ongoing bouts of lethargy with dysphonia and signs of dysphagia. MBS 09/10 with prolonged mastication but without signs of aspiration and diet advanced to regular today with tube feeds at nights for severe malnutrition.  He is noted to be severely deconditioned with HR upto 140s with minimal activity. He is limited by weakness, cognitive deficits with delay in processing and requires min assist for pregait activity. CIR recommended due to functional decline.      Review of Systems  Constitutional:  Negative for chills and fever.  HENT:  Negative for hearing loss and tinnitus.   Eyes:  Negative for blurred vision and double vision.  Respiratory:  Negative for cough and sputum production.   Cardiovascular:  Negative for chest pain and palpitations.  Gastrointestinal:  Negative for abdominal pain.  Genitourinary:  Negative for dysuria.  Musculoskeletal:  Positive for falls. Negative for myalgias.  Neurological:  Positive for weakness. Negative for dizziness and headaches.  Psychiatric/Behavioral:  Positive for memory loss. The patient does not have insomnia.             Past Medical History:  Diagnosis Date   Myocardial infarction [facility]) 1989               Past Surgical History:  Procedure Laterality Date   FEMORAL-POPLITEAL BYPASS GRAFT Bilateral 2021   GALLBLADDER SURGERY   2019   VASECTOMY  1992               Family History  Problem Relation Age of Onset   Dementia Mother     Congestive Heart Failure Father     Emphysema Father     Bladder Cancer Paternal Grandfather            Social History:  Married. 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 He  reports that he has been smoking cigarettes about 1/2 PPD.  He uses smokeless tobacco. He reports current alcohol use. No history on file for drug use.      Allergies:  Allergies  No Known Allergies             Medications Prior to Admission  Medication Sig Dispense Refill   aspirin EC 81 MG tablet Take 81 mg by mouth daily. Swallow whole.       atorvastatin (LIPITOR) 80 MG tablet Take 80 mg by mouth daily.       buPROPion (WELLBUTRIN XL) 150 MG 24 hr tablet Take 150 mg by mouth daily.       clopidogrel (PLAVIX) 75 MG tablet Take 75 mg by mouth daily.       folic acid (FOLVITE) 1 MG tablet Take 1 mg by mouth daily.       loperamide (IMODIUM A-D) 2 MG tablet Take 2 mg by mouth as needed for diarrhea or loose stools.       MAGNESIUM PO Take 1 tablet by mouth daily.       metoprolol succinate (TOPROL-XL) 25 MG 24 hr tablet Take 12.5 mg by mouth daily.       mirtazapine (REMERON) 15 MG tablet Take 30 mg by mouth at bedtime.       nitroGLYCERIN (NITROSTAT) 0.4 MG SL tablet Place 0.4 mg under the tongue every 5 (five) minutes as needed for chest pain.       pantoprazole (PROTONIX) 40 MG tablet Take 40 mg by mouth daily.       potassium chloride SA (KLOR-CON M) 20 MEQ tablet Take 20 mEq by mouth daily.       thiamine (VITAMIN B1) 100 MG tablet Take 100 mg by mouth daily.       lisinopril (ZESTRIL) 2.5 MG tablet Take 2.5 mg by mouth daily. (Patient not taking: Reported on 09/16/2022)                  Home: Home Living Family/patient expects to be discharged to:: Private residence Living Arrangements: Spouse/significant other Available Help at Discharge: Family, Available 24 hours/day Type of Home: House Home Access: Stairs to enter [employer] of Steps: 7 Home Layout: Multi-level, Bed/bath upstairs Alternate Level Stairs-Number of Steps: flight Bathroom Shower/Tub: Associate Professor: Yes Home Equipment: None  Lives With: Spouse   Functional History: Prior Function Prior Level of Function : Independent/Modified Independent Mobility Comments: no AD ADLs Comments: retired,  drives   Functional Status:  Mobility: Bed Mobility Overal bed mobility: Needs Assistance Bed Mobility: Rolling, Sidelying to Sit Rolling: Min assist Sidelying to sit: Mod assist, Used rails, HOB elevated Supine to sit: Mod assist Sit to supine: Mod assist Sit to sidelying: Min assist, HOB elevated, Used rails General bed mobility comments: assisting with rolling and raising trunk Transfers Overall transfer level: Needs assistance Equipment used: Rolling walker (2 wheels) Transfers: Sit to/from Stand, Bed to chair/wheelchair/BSC Sit to Stand: Min assist, From elevated surface Bed to/from chair/wheelchair/BSC transfer type:: Step  pivot Step pivot transfers: Mod assist, From elevated surface General transfer comment: mod assist to steady and assistance with RW with cues for hand placement and safety Ambulation/Gait Ambulation/Gait assistance: Min assist Gait Distance (Feet): 4 Feet Assistive device: Rolling walker (2 wheels) Gait Pattern/deviations: Step-to pattern, Trunk flexed, Narrow base of support General Gait Details: HR 143 bpm with dynamic standing tasks so defer longer distance, pt also c/o fatigue after recently getting up ~10 mins prior to NT/RN assist for toileting. Gait velocity interpretation: <1.8 ft/sec, indicate of risk for recurrent falls Pre-gait activities: standing hip flexion x10 reps ea with high knees at RW   ADL: ADL Overall ADL's : Needs assistance/impaired Eating/Feeding: NPO Eating/Feeding Details (indicate cue type and reason): cortrak Grooming: Wash/dry hands, Wash/dry face, Brushing hair, Set up, Sitting Grooming Details (indicate cue type and reason): in recliner Upper Body Bathing: Set up, Sitting Lower Body Bathing: Maximal assistance, Sit to/from stand Lower Body Bathing Details (indicate cue type and reason): assist with cleaning following bowel incontenence in bed Upper Body Dressing : Set up, Sitting Upper Body Dressing Details (indicate  cue type and reason): gown change Lower Body Dressing: Moderate assistance, Sitting/lateral leans Lower Body Dressing Details (indicate cue type and reason): change socks Toilet Transfer: Minimal assistance, Stand-pivot, Rolling walker (2 wheels), BSC/3in1 Toileting- Clothing Manipulation and Hygiene: Maximal assistance, Sit to/from stand Toileting - Clothing Manipulation Details (indicate cue type and reason): requires BUE support Functional mobility during ADLs: Minimal assistance, Rolling walker (2 wheels) General ADL Comments: poor activity tolerance, weakness, poor insight, incontinent stool   Cognition: Cognition Overall Cognitive Status: Impaired/Different from baseline Arousal/Alertness: Awake/alert Orientation Level: Oriented to person, Oriented to place, Disoriented to time Attention: Sustained Sustained Attention: Impaired Sustained Attention Impairment: Verbal basic Memory: Impaired Memory Impairment: Storage deficit Awareness: Impaired Awareness Impairment: Intellectual impairment Safety/Judgment: Impaired Cognition Arousal: Alert Behavior During Therapy: WFL for tasks assessed/performed Overall Cognitive Status: Impaired/Different from baseline Area of Impairment: Memory, Following commands, Safety/judgement, Awareness, Problem solving Orientation Level: Disoriented to, Time Current Attention Level: Sustained Memory: Decreased recall of precautions, Decreased short-term memory Following Commands: Follows one step commands consistently Safety/Judgement: Decreased awareness of safety, Decreased awareness of deficits Awareness: Emergent Problem Solving: Slow processing, Difficulty sequencing, Requires verbal cues General Comments: appeared unaware he had bowel incontenence in bed, good participation, following directions     Blood pressure 103/69, pulse (!) 109, temperature 98 F (36.7 C), temperature source Oral, resp. rate 20, height 5\' 10"  (1.778 m), weight 57.4  kg, SpO2 97%. Physical Exam Vitals and nursing note reviewed.  Constitutional:      General: He is not in acute distress.    Comments: Frail appearing with occasional congested cough.   HENT:     Head: Normocephalic.     Nose: Nose normal.     Comments: NGT in place Eyes:     Extraocular Movements: Extraocular movements intact.     Pupils: Pupils are equal, round, and reactive to light.  Cardiovascular:     Rate and Rhythm: Normal rate and regular rhythm.     Heart sounds: No murmur heard.    No gallop.  Pulmonary:     Effort: Pulmonary effort is normal. No respiratory distress.     Breath sounds: Rhonchi present.  Abdominal:     General: Bowel sounds are normal. There is no distension.     Palpations: Abdomen is soft.  Genitourinary:    Comments: Incontinent of stool Musculoskeletal:        General: No  swelling or tenderness. Normal range of motion.     Cervical back: Normal range of motion.  Skin:    Findings: Bruising (BUE) present.     Comments: Healed excoriated areas bilateral knees, areas of breakdown back and buttocks, clean  Neurological:     Mental Status: He is alert and oriented to person, place, and time.     Comments: Alert and oriented to name, place, month, year, date, missed day by one. Reasonable insight and awareness. Mild dysphonia. He was able to follow simple commands without difficulty. Slow to process and easily distracted. RUE 4+/5. LUE 4/5 prox to distal. RLE 4-/5 prox to 4/5 distally. LLE 3+/5 prox to 4/5 distally. No focal sensory findings. Normal muscle tone.  Psychiatric:     Comments: Pleasant, a little eccentric but cooperative        Lab Results Last 48 Hours        Results for orders placed or performed during the hospital encounter of 09/15/22 (from the past 48 hour(s))  Glucose, capillary     Status: Abnormal    Collection Time: 09/22/22  4:24 PM  Result Value Ref Range    Glucose-Capillary 131 (H) 70 - 99 mg/dL      Comment: Glucose  reference range applies only to samples taken after fasting for at least 8 hours.  Glucose, capillary     Status: Abnormal    Collection Time: 09/22/22  9:07 PM  Result Value Ref Range    Glucose-Capillary 114 (H) 70 - 99 mg/dL      Comment: Glucose reference range applies only to samples taken after fasting for at least 8 hours.  Basic metabolic panel     Status: Abnormal    Collection Time: 09/23/22  6:47 AM  Result Value Ref Range    Sodium 140 135 - 145 mmol/L    Potassium 4.7 3.5 - 5.1 mmol/L    Chloride 110 98 - 111 mmol/L    CO2 21 (L) 22 - 32 mmol/L    Glucose, Bld 129 (H) 70 - 99 mg/dL      Comment: Glucose reference range applies only to samples taken after fasting for at least 8 hours.    BUN 43 (H) 8 - 23 mg/dL    Creatinine, Ser 1.61 (H) 0.61 - 1.24 mg/dL    Calcium 8.2 (L) 8.9 - 10.3 mg/dL    GFR, Estimated >09 >60 mL/min      Comment: (NOTE) Calculated using the CKD-EPI Creatinine Equation (2021)      Anion gap 9 5 - 15      Comment: Performed at [facility], [address] [place], [place] [address]  CBC with Differential/Platelet     Status: Abnormal    Collection Time: 09/23/22  6:47 AM  Result Value Ref Range    WBC 7.6 4.0 - 10.5 K/uL    RBC 2.03 (L) 4.22 - 5.81 MIL/uL    Hemoglobin 7.0 (L) 13.0 - 17.0 g/dL    HCT 81.1 (L) 91.4 - 52.0 %    MCV 103.9 (H) 80.0 - 100.0 fL    MCH 34.5 (H) 26.0 - 34.0 pg    MCHC 33.2 30.0 - 36.0 g/dL    RDW 78.2 (H) 95.6 - 15.5 %    Platelets 190 150 - 400 K/uL    nRBC 0.0 0.0 - 0.2 %    Neutrophils Relative % 76 %    Neutro Abs 5.8 1.7 - 7.7 K/uL  Lymphocytes Relative 9 %    Lymphs Abs 0.7 0.7 - 4.0 K/uL    Monocytes Relative 8 %    Monocytes Absolute 0.6 0.1 - 1.0 K/uL    Eosinophils Relative 6 %    Eosinophils Absolute 0.4 0.0 - 0.5 K/uL    Basophils Relative 0 %    Basophils Absolute 0.0 0.0 - 0.1 K/uL    Immature Granulocytes 1 %    Abs Immature Granulocytes 0.04 0.00 - 0.07 K/uL      Comment: Performed  at [facility], [address] [place], [place] [address]  Magnesium     Status: None    Collection Time: 09/23/22  6:47 AM  Result Value Ref Range    Magnesium 1.7 1.7 - 2.4 mg/dL      Comment: Performed at [facility], [address], [place], [place] [address]  ABO/Rh     Status: None    Collection Time: 09/23/22  6:47 AM  Result Value Ref Range    ABO/RH(D)          A POS Performed at [facility], [address] [place], [place] [address]    Glucose, capillary     Status: Abnormal    Collection Time: 09/23/22  8:51 AM  Result Value Ref Range    Glucose-Capillary 135 (H) 70 - 99 mg/dL      Comment: Glucose reference range applies only to samples taken after fasting for at least 8 hours.  Glucose, capillary     Status: Abnormal    Collection Time: 09/23/22 12:18 PM  Result Value Ref Range    Glucose-Capillary 108 (H) 70 - 99 mg/dL      Comment: Glucose reference range applies only to samples taken after fasting for at least 8 hours.  Glucose, capillary     Status: Abnormal    Collection Time: 09/23/22  4:21 PM  Result Value Ref Range    Glucose-Capillary 138 (H) 70 - 99 mg/dL      Comment: Glucose reference range applies only to samples taken after fasting for at least 8 hours.  Glucose, capillary     Status: Abnormal    Collection Time: 09/23/22  8:36 PM  Result Value Ref Range    Glucose-Capillary 117 (H) 70 - 99 mg/dL      Comment: Glucose reference range applies only to samples taken after fasting for at least 8 hours.  Glucose, capillary     Status: Abnormal    Collection Time: 09/24/22 12:41 AM  Result Value Ref Range    Glucose-Capillary 127 (H) 70 - 99 mg/dL      Comment: Glucose reference range applies only to samples taken after fasting for at least 8 hours.  Phosphorus     Status: Abnormal    Collection Time: 09/24/22  3:58 AM  Result Value Ref Range    Phosphorus 1.4 (L) 2.5 - 4.6 mg/dL      Comment: Performed at [facility],  [address], [place], [place] [address]  Vitamin B12     Status: None    Collection Time: 09/24/22  3:58 AM  Result Value Ref Range    Vitamin B-12 258 180 - 914 pg/mL      Comment: (NOTE) This assay is not validated for testing neonatal or myeloproliferative syndrome specimens for Vitamin B12 levels. Performed at [facility], [address], [place], [place] [address]    Folate  Status: None    Collection Time: 09/24/22  3:58 AM  Result Value Ref Range    Folate 13.4 >5.9 ng/mL      Comment: Performed at [facility], [address], [place], [place] [address]  Iron and TIBC     Status: Abnormal    Collection Time: 09/24/22  3:58 AM  Result Value Ref Range    Iron 28 (L) 45 - 182 ug/dL    TIBC 914 (L) 782 - 956 ug/dL    Saturation Ratios 19 17.9 - 39.5 %    UIBC 119 ug/dL      Comment: Performed at [facility], [address], [place], [place] [address]  Ferritin     Status: Abnormal    Collection Time: 09/24/22  3:58 AM  Result Value Ref Range    Ferritin 2,896 (H) 24 - 336 ng/mL      Comment: Performed at [facility], [address] [place], [place] [address]  Reticulocytes     Status: Abnormal    Collection Time: 09/24/22  3:58 AM  Result Value Ref Range    Retic Ct Pct 2.3 0.4 - 3.1 %    RBC. 2.04 (L) 4.22 - 5.81 MIL/uL    Retic Count, Absolute 46.7 19.0 - 186.0 K/uL    Immature Retic Fract 20.7 (H) 2.3 - 15.9 %      Comment: Performed at [facility], [address] [place], [place] [address]  CBC     Status: Abnormal    Collection Time: 09/24/22  3:58 AM  Result Value Ref Range    WBC 7.4 4.0 - 10.5 K/uL    RBC 2.03 (L) 4.22 - 5.81 MIL/uL    Hemoglobin 6.9 (LL) 13.0 - 17.0 g/dL      Comment: REPEATED TO VERIFY THIS CRITICAL RESULT HAS VERIFIED AND BEEN CALLED TO R CAIOLISTA RN BY JALEESA WHITE ON 09 12 2024 AT 0438, AND HAS BEEN READ BACK.       HCT 21.3 (L) 39.0 - 52.0 %    MCV 104.9 (H) 80.0 - 100.0 fL    MCH 34.0 26.0 -  34.0 pg    MCHC 32.4 30.0 - 36.0 g/dL    RDW 52.8 (H) 41.3 - 15.5 %    Platelets 244 150 - 400 K/uL    nRBC 0.0 0.0 - 0.2 %      Comment: Performed at [facility], [address], [place], [place] [address]  Basic metabolic panel     Status: Abnormal    Collection Time: 09/24/22  3:58 AM  Result Value Ref Range    Sodium 139 135 - 145 mmol/L    Potassium 5.2 (H) 3.5 - 5.1 mmol/L    Chloride 112 (H) 98 - 111 mmol/L    CO2 21 (L) 22 - 32 mmol/L    Glucose, Bld 133 (H) 70 - 99 mg/dL      Comment: Glucose reference range applies only to samples taken after fasting for at least 8 hours.    BUN 44 (H) 8 - 23 mg/dL    Creatinine, Ser 0.27 (H) 0.61 - 1.24 mg/dL    Calcium 8.3 (L) 8.9 - 10.3 mg/dL    GFR, Estimated 55 (L) >60 mL/min      Comment: (NOTE) Calculated using the CKD-EPI Creatinine Equation (2021)      Anion gap 6 5 - 15      Comment: Performed at  [facility], [address], [place], [place] [address]  Glucose, capillary     Status: Abnormal    Collection Time: 09/24/22  7:24 AM  Result Value Ref Range    Glucose-Capillary 119 (H) 70 - 99 mg/dL      Comment: Glucose reference range applies only to samples taken after fasting for at least 8 hours.  Type and screen [facility]     Status: None (Preliminary result)    Collection Time: 09/24/22  9:30 AM  Result Value Ref Range    ABO/RH(D) A POS      Antibody Screen NEG      Sample Expiration 09/27/2022,2359      Unit Number [id number]      Blood Component Type RED CELLS,LR      Unit division 00      Status of Unit ISSUED      Transfusion Status OK TO TRANSFUSE      Crossmatch Result          Compatible Performed at [facility], [address], [place], [place] [address]    Prepare RBC (crossmatch)     Status: None    Collection Time: 09/24/22  9:30 AM  Result Value Ref Range    Order Confirmation          ORDER PROCESSED BY BLOOD BANK Performed at [facility], [address], [place], [place] [address]    Glucose, capillary     Status: Abnormal    Collection Time: 09/24/22 11:25 AM  Result Value Ref Range    Glucose-Capillary 132 (H) 70 - 99 mg/dL      Comment: Glucose reference range applies only to samples taken after fasting for at least 8 hours.      Imaging Results (Last 48 hours)  No results found.         Blood pressure 103/69, pulse (!) 109, temperature 98 F (36.7 C), temperature source Oral, resp. rate 20, height 5\' 10"  (1.778 m), weight 57.4 kg, SpO2 97%.   Medical Problem List and Plan: 1. 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  Antithrombotics: -DVT/anticoagulation:  Pharmaceutical: Lovenox             -antiplatelet therapy: ASA/ 3. Pain Management: Tylenol prn.  4. Mood/Behavior/Sleep: LCSW to follow for evaluation and support.              -antipsychotic agents: N/A             -wife is interested in neuropsych counseling for ?depression 5. Neuropsych/cognition: This patient is not fully capable of making decisions on his own behalf. 6. Skin/Wound Care: Air mattress for pressure relief and management of MASD/DTPI             --tube feeds and Prostat for malnutrition.              --Vitamin C added.  7. Fluids/Electrolytes/Nutrition: Monitor I/O. Continue tube feeds at nights             --monitor for signs of overload.  8. Aspiration PNA: Sputum positive for Staph Lugdunensis. Augmentin thru 09/14 to complete 7 day course. 9. ETOH abuse w/withdrawal seizures: wife interested in ETOH counseling.              -  discussed that majority of etoh counseling would be after his rehab stay 10. Acute on chronic anemia: Hgb has trended down form mid 8's to 6.9 on 9/12-->transfused with 1 unit PRBC             --will order FOBT and monitor for other signs of bleeding.  11.  Sever thiamine deficiency @ 1.1:  Supplemented parentally X 5 days. Continue oral supplement.  12. H/o Chronic HFrEF w/recovery. S/p BIV ICD: Continues to have soft BP limiting GDMT.             --strict I/O. Daily wts to monitor for signs of overload.  Continue ASA/Statin.             --continue to hold Lisinopril and metoprolol. Monitor HR/for symptoms with increase with activity.  13. PAD s/p stent/aortobifemoral BG: On ASA and statin. Off Plavix due to low platelets/anemia.  14. Macrocytic anemia/ Low normal B12 @ 258: Started on supplement today. .   15. Dysphagia/chronic anorexia: Per family eats once a day.  Diet upgraded to regular             --continue on tube feeds at nights.  16. CKD?: SCr- 2.16 @ admission. Hyperkalemia today treated with lokelma --will change tube feeds to nephro due to worsening of renal status--BUN/SCr 44/1.4/K-5.2 --on water flushes 300 cc/QID. Strict I/O.   17. Magnesium/Phosphorous: trending down-->recheck in am         Jacquelynn Cree, PA-C 09/24/2022  I have personally performed a face to face diagnostic evaluation of this patient and formulated the key components of the plan.  Additionally, I have personally reviewed laboratory data, imaging studies, as well as relevant notes and concur with the physician assistant's documentation above.  The patient's status has not changed from the original H&P.  Any changes in documentation from the acute care chart have been noted above.  Ranelle Oyster, MD, [place]

## 2022-09-24 NOTE — Plan of Care (Signed)
Wound Plan   Wounds present:  MASD thoracic spine  Pressure Injury 09/15/22 Vertebral column Mid Stage 1 -  Intact skin with non-blanchable redness of a localized area usually over a bony prominence. stage 1 mid back (Active)    Pressure Injury 09/15/22 Buttocks Right;Left;Lower Deep Tissue Pressure Injury - Purple or maroon localized area of discolored intact skin or blood-filled blister due to damage of underlying soft tissue from pressure and/or shear. DTI sacrum (Active)   Interventions:   Wound care  Daily      Comments: Clean thoracic spine DTPI with soap and water, dry and apply a layer of Xerforom gauze [place] [phone]) to purple maroon discoloration daily.  Cover with silicone foam.  May lift foam daily to replace Xeroform.  Change foam q3 days and prn soiling                          Wound care  3 times daily      Comments: Clean buttocks/sacrum/coccyx with soap and water, dry and apply Gerhardt's Butt Cream to entire area 3 times a day and prn soiling.  Additional interventions: Juven twice a day Prostat MVI Vitamin B12 daily Vitamin B1 daily  Ensure Enlive twice a day HH diet;  Check Vitamin A, Zinc, Vitamin B6 levels  Air mattress Air redistribution chair pad  Braden Score: 14 Sensory: 3 Moisture: 3 Activity: 2 Mobility: 3 Nutrition: 2 Friction: 1  Contributors: Heather Bullins MSN, RN-BC, CWOCN Kendell Bane., RD, LDN, CNSC  Chana Bode, RN-BC, CRRN

## 2022-09-24 NOTE — Care Management Important Message (Signed)
Important Message  Patient Details  Name: Derek Oconnell MRN: [id number] Date of Birth: 09-30-1955   Medicare Important Message Given:  Yes     Madai Nuccio Stefan Church 09/24/2022, 9:45 AM

## 2022-09-24 NOTE — Plan of Care (Signed)
  Problem: Clinical Measurements: Goal: Cardiovascular complication will be avoided Outcome: Progressing   Problem: Nutrition: Goal: Adequate nutrition will be maintained Outcome: Progressing   Problem: Safety: Goal: Ability to remain free from injury will improve Outcome: Progressing   Problem: Respiratory: Goal: Ability to maintain a clear airway and adequate ventilation will improve Outcome: Progressing

## 2022-09-24 NOTE — TOC Transition Note (Signed)
Transition of Care [facility]) - CM/SW Discharge Note   Patient Details  Name: Derek Oconnell MRN: [id number] Date of Birth: 1955/05/21  [facility] of [facility]) CM/SW Contact:  Lockie Pares, RN Phone Number: 09/24/2022, 10:24 AM   Clinical Narrative:    Patient will be DC after blood transfusion to CIR  NO further needs Identified at this time   Final next level of care: IP Rehab Facility Barriers to Discharge: No Barriers Identified   Patient Goals and CMS Choice      Discharge Placement                         Discharge Plan and Services Additional resources added to the After Visit Summary for                                       Social Determinants of Health (SDOH) Interventions SDOH Screenings   Food Insecurity: No Food Insecurity (02/01/2022)   Received from [facility]  Transportation Needs: No Transportation Needs (02/01/2022)   Received from [facility]  Financial Resource Strain: Low Risk  (02/01/2022)   Received from [facility]  Physical Activity: Inactive (05/20/2022)   Received from [facility]  Social Connections: Moderately Isolated (01/11/2018)   Received from [facility]  Stress: No Stress Concern Present (07/17/2021)   Received from [facility]  Tobacco Use: High Risk (09/15/2022)  Health Literacy: Low Risk  (04/21/2020)   Received from [facility]     Readmission Risk Interventions    09/17/2022   10:42 AM  Readmission Risk Prevention Plan  Post Dischage Appt Complete  Medication Screening Complete  Transportation Screening Complete

## 2022-09-24 NOTE — Progress Notes (Signed)
Speech Language Pathology Treatment: Dysphagia  Patient Details Name: Daxon Greason MRN: [id number] DOB: June 29, 1955 Today's Date: 09/24/2022 Time: 1250-1310 SLP Time Calculation (min) (ACUTE ONLY): 20 min  Assessment / Plan / Recommendation Clinical Impression  Pt seen by SLP for meal tray upgraded PO trials. Pt had requested cheeseburger and fries on 09/11. Pt wife was in the room during tx. Pt drank thin liquid (apple juice) via straw with no s/sx of dysphagia. To begin eating, pt required multiple verbal and visual cues. He appeared distractible throughout the session. Pt was prompted to take a bite of fry. He took a small bite and lightly masticated (<3 three full chews) before spitting it out, stating he was having difficulty chewing. After SLP prompt to attempt cheeseburger, pt again lightly masticating before spitting it out and stating it was "sour" tasting. No improvement with bite-size piece cut out by SLP. Pt appears to present with a cognitive dysphagia at this time, given no s/sx dysphagia were otherwise observed. Long hx of difficulty with appetite/motivation to eat per pt's spouse. Pt seems to do better with consuming liquids. SLP recommendation of regular diet and thin liquid, with minimization of environmental distractions to improve pt attention to POs. SLP provided pt and spouse education on making dietary choices that are easy for him to chew and meet his preferences. No further dysphagia tx recommended at this time. Appetite/motivation to eat may improve as pt moves closer to baseline.   HPI HPI: Pt is a 67 y.o. male who presented to [facility] on 09/15/22 with AMS and seizure-like activity. Pt was sedated and intubated then transferred to [facility] for LTM. 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 He has since weaned off norepinephrine and was transferred out of ICU on 9/9. PMH is significant for HTN, CHF, CAD s/p stenting and PPM, COPD, EtOH  dependence.      SLP Plan  Discharge SLP treatment due to (comment) (Pt d/c to CIR, SLP services to continue there for cognition)      Recommendations for follow up therapy are one component of a multi-disciplinary discharge planning process, led by the attending physician.  Recommendations may be updated based on patient status, additional functional criteria and insurance authorization.    Recommendations  Diet recommendations: Regular;Thin liquid Liquids provided via: Straw Medication Administration: Whole meds with liquid Supervision: Full supervision/cueing for compensatory strategies;Patient able to self feed Compensations: Minimize environmental distractions;Slow rate;Small sips/bites Postural Changes and/or Swallow Maneuvers: Seated upright 90 degrees                  Oral care BID;Staff/trained caregiver to provide oral care   Frequent or constant Supervision/Assistance Dysphagia, oral phase (R13.11)     Discharge SLP treatment due to (comment) (Pt d/c to CIR, SLP services to continue there for cognition)     Marline Backbone, B.S., Speech Therapy Student

## 2022-09-24 NOTE — Plan of Care (Signed)

## 2022-09-24 NOTE — Progress Notes (Signed)
Occupational Therapy Treatment Patient Details Name: Derek Oconnell MRN: [id number] DOB: [birth year] Today's Date: 09/24/2022   History of present illness 67 y.o. male admitted 9/3 with unresponsive episode at home with seizure like activity. Intubated 9/3-9/5. 9/4 EEG with encephalopathy. 9/6 acute cortical infarcts R parietal lobe.  PMhx: HTN, HFrEF with EF 25-30%, CAD, COPD, HTN, biventricular ICD, heavy EtOH use   OT comments  Patient received in supine and agreeable to OT treatment. Patient had soiled bed and appeared unaware. Patient stood from EOB to allow for cleaning with 2 stands to complete. Patient assisted with completing LB bathing and UB/LB dressing from recliner. Patient is demonstrating good gains with OT treatment with transfers and self care. Patient will benefit from intensive inpatient follow up therapy, >3 hours/day.       If plan is discharge home, recommend the following:  A lot of help with bathing/dressing/bathroom;Assistance with cooking/housework;Direct supervision/assist for medications management;Direct supervision/assist for financial management;Assist for transportation;A little help with walking and/or transfers   Equipment Recommendations  None recommended by OT    Recommendations for Other Services      Precautions / Restrictions Precautions Precautions: Fall;Other (comment) Precaution Comments: cortrak, watch HR Restrictions Weight Bearing Restrictions: No       Mobility Bed Mobility Overal bed mobility: Needs Assistance Bed Mobility: Rolling, Sidelying to Sit Rolling: Min assist Sidelying to sit: Mod assist, Used rails, HOB elevated       General bed mobility comments: assisting with rolling and raising trunk    Transfers Overall transfer level: Needs assistance Equipment used: Rolling walker (2 wheels) Transfers: Sit to/from Stand, Bed to chair/wheelchair/BSC Sit to Stand: Min assist, From elevated surface     Step pivot transfers: Mod  assist, From elevated surface     General transfer comment: mod assist to steady and assistance with RW with cues for hand placement and safety     Balance Overall balance assessment: Needs assistance Sitting-balance support: Feet supported, No upper extremity supported Sitting balance-Leahy Scale: Fair Sitting balance - Comments: seated EOB   Standing balance support: Bilateral upper extremity supported, During functional activity, Reliant on assistive device for balance Standing balance-Leahy Scale: Poor Standing balance comment: reliant on UE support                           ADL either performed or assessed with clinical judgement   ADL Overall ADL's : Needs assistance/impaired     Grooming: Wash/dry hands;Wash/dry face;Brushing hair;Set up;Sitting Grooming Details (indicate cue type and reason): in recliner     Lower Body Bathing: Maximal assistance;Sit to/from stand Lower Body Bathing Details (indicate cue type and reason): assist with cleaning following bowel incontenence in bed Upper Body Dressing : Set up;Sitting Upper Body Dressing Details (indicate cue type and reason): gown change Lower Body Dressing: Moderate assistance;Sitting/lateral leans Lower Body Dressing Details (indicate cue type and reason): change socks                    Extremity/Trunk Assessment              Vision       Perception     Praxis      Cognition Arousal: Alert Behavior During Therapy: WFL for tasks assessed/performed Overall Cognitive Status: Impaired/Different from baseline Area of Impairment: Memory, Following commands, Safety/judgement, Awareness, Problem solving                   Current Attention  Level: Sustained Memory: Decreased recall of precautions, Decreased short-term memory Following Commands: Follows one step commands consistently Safety/Judgement: Decreased awareness of safety, Decreased awareness of deficits Awareness:  Emergent Problem Solving: Slow processing, Difficulty sequencing, Requires verbal cues General Comments: appeared unaware he had bowel incontenence in bed, good participation, following directions        Exercises      Shoulder Instructions       General Comments      Pertinent Vitals/ Pain       Pain Assessment Pain Assessment: Faces Faces Pain Scale: Hurts a little bit Pain Location: generalized with movement Pain Descriptors / Indicators: Discomfort, Grimacing Pain Intervention(s): Monitored during session, Repositioned  Home Living                                          Prior Functioning/Environment              Frequency  Min 1X/week        Progress Toward Goals  OT Goals(current goals can now be found in the care plan section)  Progress towards OT goals: Progressing toward goals  Acute Rehab OT Goals Patient Stated Goal: none stated OT Goal Formulation: With patient Time For Goal Achievement: 10/05/22 Potential to Achieve Goals: Good ADL Goals Pt Will Perform Grooming: with supervision;standing Pt Will Perform Upper Body Dressing: with modified independence Pt Will Perform Lower Body Dressing: with contact guard assist;sit to/from stand Pt Will Transfer to Toilet: with contact guard assist;ambulating  Plan      Co-evaluation                 AM-PAC OT "6 Clicks" Daily Activity     Outcome Measure   Help from another person eating meals?: Total Help from another person taking care of personal grooming?: A Little Help from another person toileting, which includes using toliet, bedpan, or urinal?: A Lot Help from another person bathing (including washing, rinsing, drying)?: A Lot Help from another person to put on and taking off regular upper body clothing?: A Little Help from another person to put on and taking off regular lower body clothing?: A Lot 6 Click Score: 13    End of Session Equipment Utilized During  Treatment: Gait belt;Rolling walker (2 wheels)  OT Visit Diagnosis: Unsteadiness on feet (R26.81);Other abnormalities of gait and mobility (R26.89);Muscle weakness (generalized) (M62.81)   Activity Tolerance Patient tolerated treatment well   Patient Left in chair;with call bell/phone within reach;with chair alarm set   Nurse Communication Mobility status        Time: [phone] OT Time Calculation (min): 27 min  Charges: OT General Charges $OT Visit: 1 Visit OT Treatments $Self Care/Home Management : 23-37 mins  Alfonse Flavors, OTA Acute Rehabilitation Services  Office [phone]   Dewain Penning 09/24/2022, 10:05 AM

## 2022-09-24 NOTE — Progress Notes (Signed)
PMR Admission Coordinator Pre-Admission Assessment   Patient: Derek Oconnell is an 67 y.o., male MRN: [id number] DOB: [birth year] Height: 5\' 10"  (177.8 cm) Weight: 57.4 kg   Insurance Information HMO:     PPO:      PCP:      IPA:      80/20: yes     OTHER:  PRIMARY: Medicare Parts A and B       Policy#: [id number]  Subscriber:  Phone#: Verified online    Fax#:  Pre-Cert#:       Employer:  Benefits:  Phone #:      Name:  Eff. Date: Parts A  and B effective 07/12/21 Deduct: $1632      Out of Pocket Max:  None      Life Max: N/A  CIR: 100%      SNF: 100 days Outpatient: 80%     Co-Pay: 20% Home Health: 100%      Co-Pay: none DME: 80%     Co-Pay: 20% Providers: patient's choice   SECONDARY: Tricare for Life      Policy#: [id number]      Phone#:    Financial Counselor:       Phone#:    The "Data Collection Information Summary" for patients in Inpatient Rehabilitation Facilities with attached "Privacy Act Statement-Health Care Records" was provided and verbally reviewed with: Patient   Emergency Contact Information Contact Information       Name Relation Home Work Mobile    [name],[name] Spouse     [phone]         Other Contacts   None on File        Current Medical History  Patient Admitting Diagnosis: R CVA,Encephalopathy, Alcohol Withdrawal   History of Present Illness: Pt is a 67 year old male  with h/o HTN, CAD, pacemaker placement, COPD, and ETOH use d/o who presented to [facility] on 09/15/22 with AMS and seizure-like activity.  He was intubated and transferred to [facility] for LTM on 09/15/22.  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 CTA head & neck No LVO. 2D Echo 9/4: EF 60 to 65% ventricle with grade 2 diastolic dysfunction.  Per neurology, aspirin 81 mg daily and clopidogrel 75 mg daily prior to admission, now on aspirin 81 mg daily alone due to thrombocytopenia.Pt. also with alcohol withdrawal symptoms, placed on CIWA protocol  on admission.  He required pressors and developed low-grade fever with GGO in the RUL and CTA and so was started on Unasyn for aspiration PNA.  He has since weaned off norepinephrine and was transferred out of ICU on 9/9. Pt. With coretrak in place, SLP recommending puree diet, thin liquids. PT/OT/SLP saw Pt. And recommended CIR to assist return to PLOF.  HgB 6.9 on 09/24/22 and currently receiving a unit of RBCs.   Complete NIHSS TOTAL: 0   Patient's medical record from [facility]  has been reviewed by the rehabilitation admission coordinator and physician.   Past Medical History      Past Medical History:  Diagnosis Date   Myocardial infarction [facility]) 1989          Has the patient had major surgery during 100 days prior to admission? No   Family History   family history is not on file.   Current Medications  Current Medications    Current Facility-Administered Medications:    0.9 %  sodium chloride infusion, 250 mL, Intravenous,  Continuous, Paliwal, Aditya, MD, Last Rate: 10 mL/hr at 09/23/22 0540, 250 mL at 09/23/22 0540   albuterol (PROVENTIL) (2.5 MG/3ML) 0.083% nebulizer solution 2.5 mg, 2.5 mg, Nebulization, Q3H PRN, Celine Mans, Rahul P, PA-C   amoxicillin-clavulanate (AUGMENTIN) 875-125 MG per tablet 1 tablet, 1 tablet, Per Tube, Q12H, Desai, Rahul P, PA-C, 1 tablet at 09/24/22 0850   aspirin chewable tablet 81 mg, 81 mg, Per Tube, Daily, Tiffany Kocher, DO, 81 mg at 09/24/22 0852   atorvastatin (LIPITOR) tablet 80 mg, 80 mg, Per Tube, Daily, Tiffany Kocher, DO, 80 mg at 09/24/22 0850   Chlorhexidine Gluconate Cloth 2 % PADS 6 each, 6 each, Topical, Daily, Agarwala, Daleen Bo, MD, 6 each at 09/23/22 2132   cyanocobalamin (VITAMIN B12) injection 1,000 mcg, 1,000 mcg, Subcutaneous, Q2000, Ghimire, Shanker M, MD, 1,000 mcg at 09/24/22 0850   docusate (COLACE) 50 MG/5ML liquid 100 mg, 100 mg, Per Tube, BID PRN, Hunsucker, Lesia Sago, MD   feeding supplement (PROSource  TF20) liquid 60 mL, 60 mL, Per Tube, Daily, Agarwala, Ravi, MD, 60 mL at 09/24/22 0850   feeding supplement (VITAL 1.5 CAL) liquid 1,000 mL, 1,000 mL, Per Tube, Continuous, Agarwala, Ravi, MD, Last Rate: 50 mL/hr at 09/23/22 0453, Infusion Verify at 09/23/22 0453   folic acid (FOLVITE) tablet 1 mg, 1 mg, Per Tube, Daily, Agarwala, Ravi, MD, 1 mg at 09/24/22 0850   free water 300 mL, 300 mL, Per Tube, Q6H, Ghimire, Werner Lean, MD   Gerhardt's butt cream, , Topical, TID, Lynnell Catalan, MD, Given at 09/24/22 0852   guaiFENesin (ROBITUSSIN) 100 MG/5ML liquid 15 mL, 15 mL, Per Tube, Q6H PRN, Leslye Peer, MD, 15 mL at 09/24/22 0850   heparin injection 5,000 Units, 5,000 Units, Subcutaneous, Q8H, Desai, Rahul P, PA-C, 5,000 Units at 09/24/22 0523   mirtazapine (REMERON) tablet 30 mg, 30 mg, Per Tube, QHS, Bowser, Grace E, NP, 30 mg at 09/23/22 2131   multivitamin with minerals tablet 1 tablet, 1 tablet, Per Tube, Daily, Agarwala, Daleen Bo, MD, 1 tablet at 09/24/22 0850   nutrition supplement (JUVEN) (JUVEN) powder packet 1 packet, 1 packet, Per Tube, BID BM, Lynnell Catalan, MD, 1 packet at 09/24/22 0850   Oral care mouth rinse, 15 mL, Mouth Rinse, 4 times per day, Lynnell Catalan, MD, 15 mL at 09/24/22 0848   Oral care mouth rinse, 15 mL, Mouth Rinse, PRN, Agarwala, Ravi, MD   pantoprazole (PROTONIX) injection 40 mg, 40 mg, Intravenous, QHS, Desai, Rahul P, PA-C, 40 mg at 09/23/22 2303   polyethylene glycol (MIRALAX / GLYCOLAX) packet 17 g, 17 g, Per Tube, Daily PRN, Desai, Rahul P, PA-C   sodium chloride flush (NS) 0.9 % injection 10-40 mL, 10-40 mL, Intracatheter, Q12H, Byrum, Robert S, MD, 10 mL at 09/24/22 0954   sodium chloride flush (NS) 0.9 % injection 10-40 mL, 10-40 mL, Intracatheter, PRN, Byrum, Les Pou, MD   sodium phosphate 30 mmol in dextrose 5 % 250 mL infusion, 30 mmol, Intravenous, Once, Ghimire, Werner Lean, MD, Last Rate: 43 mL/hr at 09/24/22 1000, 30 mmol at 09/24/22 1000   thiamine  (VITAMIN B1) tablet 100 mg, 100 mg, Per Tube, Daily, Bell, Lorin C, RPH, 100 mg at 09/24/22 0850     Patients Current Diet:  Diet Order                  Diet regular Fluid consistency: Thin  Diet effective 1000  Precautions / Restrictions Precautions Precautions: Fall, Other (comment) Precaution Comments: cortrak, watch HR Restrictions Weight Bearing Restrictions: No    Has the patient had 2 or more falls or a fall with injury in the past year? No   Prior Activity Level Limited Community (1-2x/wk): Pt. went out once a week usually   Prior Functional Level Self Care: Did the patient need help bathing, dressing, using the toilet or eating? Independent   Indoor Mobility: Did the patient need assistance with walking from room to room (with or without device)? Independent   Stairs: Did the patient need assistance with internal or external stairs (with or without device)? Independent   Functional Cognition: Did the patient need help planning regular tasks such as shopping or remembering to take medications? Needed some help   Patient Information Are you of Hispanic, Latino/a,or Spanish origin?: A. No, not of Hispanic, Latino/a, or Spanish origin What is your race?: A. White Do you need or want an interpreter to communicate with a doctor or health care staff?: 0. No   Patient's Response To:  Health Literacy and Transportation Is the patient able to respond to health literacy and transportation needs?: Yes Health Literacy - How often do you need to have someone help you when you read instructions, pamphlets, or other written material from your doctor or pharmacy?: Never In the past 12 months, has lack of transportation kept you from medical appointments or from getting medications?: No In the past 12 months, has lack of transportation kept you from meetings, work, or from getting things needed for daily living?: No   Home Assistive Devices /  Equipment Home Equipment: None   Prior Device Use: Indicate devices/aids used by the patient prior to current illness, exacerbation or injury? Walker   Current Functional Level Cognition   Arousal/Alertness: Awake/alert Overall Cognitive Status: Impaired/Different from baseline Current Attention Level: Sustained Orientation Level: Oriented to person, Oriented to place, Disoriented to time, Disoriented to situation Following Commands: Follows one step commands consistently Safety/Judgement: Decreased awareness of safety, Decreased awareness of deficits General Comments: appeared unaware he had bowel incontenence in bed, good participation, following directions Attention: Sustained Sustained Attention: Impaired Sustained Attention Impairment: Verbal basic Memory: Impaired Memory Impairment: Storage deficit Awareness: Impaired Awareness Impairment: Intellectual impairment Safety/Judgment: Impaired    Extremity Assessment (includes Sensation/Coordination)   Upper Extremity Assessment: RUE deficits/detail, LUE deficits/detail RUE Deficits / Details: ROM is WFL, generalized weakness RUE Sensation: WNL RUE Coordination: WNL LUE Deficits / Details: globally 4/5; denies paraesthesias. poor coordination LUE Sensation: WNL LUE Coordination: decreased fine motor  Lower Extremity Assessment: Defer to PT evaluation     ADLs   Overall ADL's : Needs assistance/impaired Eating/Feeding: NPO Eating/Feeding Details (indicate cue type and reason): cortrak Grooming: Wash/dry hands, Wash/dry face, Brushing hair, Set up, Sitting Grooming Details (indicate cue type and reason): in recliner Upper Body Bathing: Set up, Sitting Lower Body Bathing: Maximal assistance, Sit to/from stand Lower Body Bathing Details (indicate cue type and reason): assist with cleaning following bowel incontenence in bed Upper Body Dressing : Set up, Sitting Upper Body Dressing Details (indicate cue type and reason): gown  change Lower Body Dressing: Moderate assistance, Sitting/lateral leans Lower Body Dressing Details (indicate cue type and reason): change socks Toilet Transfer: Minimal assistance, Stand-pivot, Rolling walker (2 wheels), BSC/3in1 Toileting- Clothing Manipulation and Hygiene: Maximal assistance, Sit to/from stand Toileting - Clothing Manipulation Details (indicate cue type and reason): requires BUE support Functional mobility during ADLs: Minimal assistance, Rolling walker (2 wheels) General  ADL Comments: poor activity tolerance, weakness, poor insight, incontinent stool     Mobility   Overal bed mobility: Needs Assistance Bed Mobility: Rolling, Sidelying to Sit Rolling: Min assist Sidelying to sit: Mod assist, Used rails, HOB elevated Supine to sit: Mod assist Sit to supine: Mod assist Sit to sidelying: Min assist, HOB elevated, Used rails General bed mobility comments: assisting with rolling and raising trunk     Transfers   Overall transfer level: Needs assistance Equipment used: Rolling walker (2 wheels) Transfers: Sit to/from Stand, Bed to chair/wheelchair/BSC Sit to Stand: Min assist, From elevated surface Bed to/from chair/wheelchair/BSC transfer type:: Step pivot Step pivot transfers: Mod assist, From elevated surface General transfer comment: mod assist to steady and assistance with RW with cues for hand placement and safety     Ambulation / Gait / Stairs / Wheelchair Mobility   Ambulation/Gait Ambulation/Gait assistance: Editor, commissioning (Feet): 4 Feet Assistive device: Rolling walker (2 wheels) Gait Pattern/deviations: Step-to pattern, Trunk flexed, Narrow base of support General Gait Details: HR 143 bpm with dynamic standing tasks so defer longer distance, pt also c/o fatigue after recently getting up ~10 mins prior to NT/RN assist for toileting. Gait velocity interpretation: <1.8 ft/sec, indicate of risk for recurrent falls Pre-gait activities: standing hip  flexion x10 reps ea with high knees at RW     Posture / Balance Dynamic Sitting Balance Sitting balance - Comments: seated EOB Balance Overall balance assessment: Needs assistance Sitting-balance support: Feet supported, No upper extremity supported Sitting balance-Leahy Scale: Fair Sitting balance - Comments: seated EOB Standing balance support: Bilateral upper extremity supported, During functional activity, Reliant on assistive device for balance Standing balance-Leahy Scale: Poor Standing balance comment: reliant on UE support     Special needs/care consideration Special service needs coretrak    Previous Home Environment (from acute therapy documentation) Living Arrangements: Spouse/significant other  Lives With: Spouse Available Help at Discharge: Family, Available 24 hours/day Type of Home: House Home Layout: Multi-level, Bed/bath upstairs Alternate Level Stairs-Number of Steps: flight Home Access: Stairs to enter Secretary/administrator of Steps: 7 Bathroom Shower/Tub: Engineer, manufacturing systems: Standard Bathroom Accessibility: Yes How Accessible: Accessible via walker Home Care Services: Yes   Discharge Living Setting Plans for Discharge Living Setting: Patient's home Type of Home at Discharge: House Discharge Home Layout: Multi-level, Able to live on main level with bedroom/bathroom Alternate Level Stairs-Rails: Right, Left Alternate Level Stairs-Number of Steps: flight Discharge Home Access: Stairs to enter Entrance Stairs-Rails: Right Entrance Stairs-Number of Steps: 4 Discharge Bathroom Shower/Tub: Walk-in shower, Tub/shower unit Discharge Bathroom Toilet: Standard Discharge Bathroom Accessibility: Yes How Accessible: Accessible via walker   Social/Family/Support Systems Patient Roles: Spouse Contact Information: [phone] Anticipated Caregiver: 24/7 Anticipated Caregiver's Contact Information: Wife [facility] and can provide 24/7 Supervision to min  A Caregiver Availability: 24/7 Discharge Plan Discussed with Primary Caregiver: Yes Is Caregiver In Agreement with Plan?: Yes Does Caregiver/Family have Issues with Lodging/Transportation while Pt is in Rehab?: No   Goals Patient/Family Goal for Rehab: PT/OT/SLP supervision Expected length of stay: 7-10 days Pt/Family Agrees to Admission and willing to participate: Yes Program Orientation Provided & Reviewed with Pt/Caregiver Including Roles  & Responsibilities: Yes   Decrease burden of Care through IP rehab admission: not anticipated   Possible need for SNF placement upon discharge: not anticipated   Patient Condition: I have reviewed medical records from [facility] , spoken with CM, and patient. I met with patient at the bedside for inpatient rehabilitation  assessment.  Patient will benefit from ongoing PT, OT, and SLP, can actively participate in 3 hours of therapy a day 5 days of the week, and can make measurable gains during the admission.  Patient will also benefit from the coordinated team approach during an Inpatient Acute Rehabilitation admission.  The patient will receive intensive therapy as well as Rehabilitation physician, nursing, social worker, and care management interventions.  Due to safety, skin/wound care, disease management, medication administration, pain management, and patient education the patient requires 24 hour a day rehabilitation nursing.  The patient is currently min to mod assist with mobility and up to max assist with basic ADLs.  Discharge setting and therapy post discharge at home with home health is anticipated.  Patient has agreed to participate in the Acute Inpatient Rehabilitation Program and will admit today.   Preadmission Screen Completed By:  Trish Mage, 09/24/2022 10:32 AM ______________________________________________________________________   Discussed status with Dr. Riley Kill on 09/24/22 at 0945 and received approval for admission  today.   Admission Coordinator:  Trish Mage, RN, time 1035/Date 09/24/22    Assessment/Plan: Diagnosis: Right CVA, etoh encephalopathy Does the need for close, 24 hr/day Medical supervision in concert with the patient's rehab needs make it unreasonable for this patient to be served in a less intensive setting? Yes Co-Morbidities requiring supervision/potential complications: COPD, CAD, HTN, ETOH abuse Due to bladder management, bowel management, safety, skin/wound care, disease management, medication administration, pain management, and patient education, does the patient require 24 hr/day rehab nursing? Yes Does the patient require coordinated care of a physician, rehab nurse, PT, OT, and SLP to address physical and functional deficits in the context of the above medical diagnosis(es)? Yes Addressing deficits in the following areas: balance, endurance, locomotion, strength, transferring, bowel/bladder control, bathing, dressing, feeding, grooming, toileting, cognition, speech, and psychosocial support Can the patient actively participate in an intensive therapy program of at least 3 hrs of therapy 5 days a week? Yes The potential for patient to make measurable gains while on inpatient rehab is excellent Anticipated functional outcomes upon discharge from inpatient rehab: supervision PT, supervision OT, supervision SLP Estimated rehab length of stay to reach the above functional goals is: 7-10 days Anticipated discharge destination: Home 10. Overall Rehab/Functional Prognosis: excellent     MD Signature: Ranelle Oyster, MD, [facility] Physical Medicine & Rehabilitation Medical Director Rehabilitation Services 09/24/2022           Revision History

## 2022-09-24 NOTE — Discharge Summary (Signed)
PATIENT DETAILS Name: Derek Oconnell Age: 67 y.o. Sex: male Date of Birth: May 22, 1955 MRN: [id number]. Admitting Physician: Lorin Glass, MD WUJ:[id number], Josephine Igo, MD  Admit Date: 09/15/2022 Discharge date: 09/24/2022  Recommendations for Outpatient Follow-up:  Follow up with PCP in 1-2 weeks Please follow electrolytes/CBC closely-see below. Repeat vitamin B12 levels in 6 weeks Ensure follow-up with neurology  Admitted From:  Home  Disposition: CIR   Discharge Condition: fair  CODE STATUS:   Code Status: Full Code   Diet recommendation:  Diet Order             Diet regular Fluid consistency: Thin  Diet effective 1000           Diet - low sodium heart healthy                    Brief Summary: Patient is a 67 y.o.  male with history of EtOH use, chronic HFrEF with recovered EF, CAD/PAD who presented to [facility] with AMS/seizure-like activity-he was intubated-and transferred to [facility] ICU for LTM.  He was stabilized-underwent neurology evaluation including LTM EEG-he was subsequently extubated and transferred to [facility] on 9/10.   Significant events: 9/3>> admitted and intubated at outside hospital  9/5>> extubated 9/6 MRI brain w acute cortical infarcts R parietal lobe 9/7>> norepinephrine-low-grade fever-ground glass opacity on CT chest-started Unasyn. 9/10>>Transfer to [facility]   Significant studies: 9/3>> CT head: No acute intracranial abnormality 9/3>> CT C-spine: No fracture/subluxation 9/3-9/4>> LTM EEG: No seizures 9/4>> Echo: EF 60-65% 9/6>> MRI brain: Acute cortical infarcts right parietal lobe 9/7>> CTA head/neck: No LVO-no significant stenosis in neck/intracranially 9/7>> A1c: 5.1 9/8>> LDL: 47   Significant microbiology data: 9/7>> blood culture: Negative 9/7>> sputum culture: Staph lugdunensis   Procedures: 9/3-9/5>> ETT 9/4>>Cortrak   Consults: None  Brief Hospital Course: Acute metabolic encephalopathy Alcohol withdrawal seizures EtOH  abuse Mentation has improved-completely awake and alert Seizures in the setting of alcohol use-no AED recommended LTM/neuroimaging as above   Septic shock with acute hypoxic respiratory failure due to aspiration pneumonia Sepsis physiology and hypoxia have resolved Sputum culture positive for staph lugdunensis-likely a contamination but already on Augmentin-plan to continue for-2 additional days-to complete a course of 7 days of total antibiotics.   Acute CVA Workup as above Per neurology note-ICD interrogation negative for A-fib Neurology recommending only aspirin for stroke-due to thrombocytopenia/fall risk   History of chronic HFrEF with recovered EF-s/p BiV ICD April 2024 PAD-s/p stent/aortofemoral bypass CAD s/p PCI to LM/LAD July 2023 Euvolemic on exam No anginal symptoms Continue aspirin/statin Beta-blocker/lisinopril held due to soft blood pressure   AKI Likely hemodynamically  mediated AKI  Appears very mild-creatinine around 1.4 this morning Continue tube feeds-add free water through NG tube Follow electrolytes closely at CIR Avoid nephrotoxic agents  Hyperkalemia Very mild Lokelma x 1 Repeat electrolytes tomorrow at CIR   HTN BP stable Lisinopril/metoprolol held for now   HLD Statin   Macrocytic anemia Likely combination of EtOH use/critical illness No evidence of GI bleeding Getting 1 unit of PRBC transfusion on 9/12 Please follow CBC closely at CIR   Thrombocytopenia Secondary to sepsis Resolved Follow CBC periodically  Vitamin B-12 deficiency Starting supplementation on 9/12-continue with CIR Repeat B12 levels in 6 weeks   Oropharyngeal dysphagia Secondary to acute illness/debility/deconditioning Change to nocturnal feeds Encourage oral intake-SLP following-SLP 1 diet. Once oral intake is further improved-please remove NG tube while at CIR.   Debility/deconditioning Secondary to critical illness PT/OT-CIR planned.  Nutrition  Status: Nutrition Problem: Severe Malnutrition Etiology: social / environmental circumstances Signs/Symptoms: severe fat depletion, severe muscle depletion Interventions: Prostat, MVI, Juven, Tube feeding  Pressure Ulcer: Agree with assessment and plan as outlined below Pressure Injury 09/15/22 Vertebral column Mid Stage 1 -  Intact skin with non-blanchable redness of a localized area usually over a bony prominence. stage 1 mid back (Active)  09/15/22 2130  Location: Vertebral column  Location Orientation: Mid  Staging: Stage 1 -  Intact skin with non-blanchable redness of a localized area usually over a bony prominence.  Wound Description (Comments): stage 1 mid back  Present on Admission: Yes  Dressing Type Foam - Lift dressing to assess site every shift 09/23/22 2000     Pressure Injury 09/15/22 Buttocks Right;Left;Lower Deep Tissue Pressure Injury - Purple or maroon localized area of discolored intact skin or blood-filled blister due to damage of underlying soft tissue from pressure and/or shear. DTI sacrum (Active)  09/15/22 2130  Location: Buttocks  Location Orientation: Right;Left;Lower  Staging: Deep Tissue Pressure Injury - Purple or maroon localized area of discolored intact skin or blood-filled blister due to damage of underlying soft tissue from pressure and/or shear.  Wound Description (Comments): DTI sacrum  Present on Admission: Yes  Dressing Type None 09/23/22 2000    Nutrition Status: Nutrition Problem: Severe Malnutrition Etiology: social / environmental circumstances Signs/Symptoms: severe fat depletion, severe muscle depletion Interventions: Prostat, MVI, Juven, Tube feeding    Obesity: Estimated body mass index is 18.16 kg/m as calculated from the following:   Height as of this encounter: 5\' 10"  (1.778 m).   Weight as of this encounter: 57.4 kg.   Discharge Diagnoses:  Principal Problem:   Status epilepticus (HCC) Active Problems:   Protein-calorie  malnutrition, severe   Pressure injury of sacral region, stage 1   Hypotension   Acute ischemic stroke [facility])   Physical deconditioning   Moderate protein-calorie malnutrition (HCC)   Acute encephalopathy   Aspiration pneumonia of right upper lobe (HCC)   Septic shock [facility])   Discharge Instructions:  Activity:  As tolerated with Full fall precautions use walker/cane & assistance as needed  Discharge Instructions     Ambulatory referral to Neurology   Complete by: As directed    An appointment is requested in approximately: 8 weeks   Diet - low sodium heart healthy   Complete by: As directed    Discharge instructions   Complete by: As directed    Follow with Primary MD  Grant Fontana, MD in 1-2 weeks  Please ensure follow-up with the stroke clinic  Please get a complete blood count and chemistry panel checked by your Primary MD at your next visit, and again as instructed by your Primary MD.  Get Medicines reviewed and adjusted: Please take all your medications with you for your next visit with your Primary MD  Laboratory/radiological data: Please request your Primary MD to go over all hospital tests and procedure/radiological results at the follow up, please ask your Primary MD to get all Hospital records sent to his/her office.  In some cases, they will be blood work, cultures and biopsy results pending at the time of your discharge. Please request that your primary care M.D. follows up on these results.  Also Note the following: If you experience worsening of your admission symptoms, develop shortness of breath, life threatening emergency, suicidal or homicidal thoughts you must seek medical attention immediately by calling 911 or calling your MD immediately  if symptoms  less severe.  You must read complete instructions/literature along with all the possible adverse reactions/side effects for all the Medicines you take and that have been prescribed to you. Take any new  Medicines after you have completely understood and accpet all the possible adverse reactions/side effects.   Do not drive when taking Pain medications or sleeping medications (Benzodaizepines)  Do not take more than prescribed Pain, Sleep and Anxiety Medications. It is not advisable to combine anxiety,sleep and pain medications without talking with your primary care practitioner  Special Instructions: If you have smoked or chewed Tobacco  in the last 2 yrs please stop smoking, stop any regular Alcohol  and or any Recreational drug use.  Wear Seat belts while driving.  Please note: You were cared for by a hospitalist during your hospital stay. Once you are discharged, your primary care physician will handle any further medical issues. Please note that NO REFILLS for any discharge medications will be authorized once you are discharged, as it is imperative that you return to your primary care physician (or establish a relationship with a primary care physician if you do not have one) for your post hospital discharge needs so that they can reassess your need for medications and monitor your lab values.   Discharge wound care:   Complete by: As directed    Wound care  Daily      Comments: Clean thoracic spine DTPI with soap and water, dry and apply a layer of Xerforom gauze [place] [phone]) to purple maroon discoloration daily.  Cover with silicone foam.  May lift foam daily to replace Xeroform.  Change foam q3 days and prn soiling    Wound care  3 times daily      Comments: Clean buttocks/sacrum/coccyx with soap and water, dry and apply Gerhardt's Butt Cream to entire area 3 times a day and prn soiling.   Increase activity slowly   Complete by: As directed       Allergies as of 09/24/2022   No Known Allergies      Medication List     STOP taking these medications    aspirin EC 81 MG tablet Replaced by: aspirin 81 MG chewable tablet   buPROPion 150 MG 24 hr tablet Commonly known as:  WELLBUTRIN XL   clopidogrel 75 MG tablet Commonly known as: PLAVIX   lisinopril 2.5 MG tablet Commonly known as: ZESTRIL   loperamide 2 MG tablet Commonly known as: IMODIUM A-D   MAGNESIUM PO   metoprolol succinate 25 MG 24 hr tablet Commonly known as: TOPROL-XL   nitroGLYCERIN 0.4 MG SL tablet Commonly known as: NITROSTAT   potassium chloride SA 20 MEQ tablet Commonly known as: KLOR-CON M   thiamine 100 MG tablet Commonly known as: VITAMIN B1       TAKE these medications    amoxicillin-clavulanate 875-125 MG tablet Commonly known as: AUGMENTIN Place 1 tablet into feeding tube every 12 (twelve) hours for 2 days.   aspirin 81 MG chewable tablet Place 1 tablet (81 mg total) into feeding tube daily. Start taking on: September 25, 2022 Replaces: aspirin EC 81 MG tablet   atorvastatin 80 MG tablet Commonly known as: LIPITOR Place 1 tablet (80 mg total) into feeding tube daily. Start taking on: September 25, 2022 What changed: how to take this   cyanocobalamin 1000 MCG/ML injection Commonly known as: VITAMIN B12 Inject 1 mL (1,000 mcg total) into the skin daily at 8 pm.   feeding supplement (PROSource TF20) liquid Place  60 mLs into feeding tube daily. Start taking on: September 25, 2022   feeding supplement (VITAL 1.5 CAL) Liqd Place 1,000 mLs into feeding tube continuous.   nutrition supplement (JUVEN) Pack Place 1 packet into feeding tube 2 (two) times daily between meals.   folic acid 1 MG tablet Commonly known as: FOLVITE Place 1 tablet (1 mg total) into feeding tube daily. Start taking on: September 25, 2022 What changed: how to take this   free water Soln Place 300 mLs into feeding tube every 6 (six) hours.   Gerhardt's butt cream Crea Apply 1 Application topically 3 (three) times daily.   mirtazapine 30 MG tablet Commonly known as: REMERON Place 1 tablet (30 mg total) into feeding tube at bedtime. What changed:  medication strength how to  take this   pantoprazole 40 MG tablet Commonly known as: Protonix Take 1 tablet (40 mg total) by mouth daily.   thiamine 100 MG tablet Commonly known as: Vitamin B-1 Place 1 tablet (100 mg total) into feeding tube daily. Start taking on: September 25, 2022               Discharge Care Instructions  (From admission, onward)           Start     Ordered   09/24/22 0000  Discharge wound care:       Comments: Wound care  Daily      Comments: Clean thoracic spine DTPI with soap and water, dry and apply a layer of Xerforom gauze [place] [phone]) to purple maroon discoloration daily.  Cover with silicone foam.  May lift foam daily to replace Xeroform.  Change foam q3 days and prn soiling    Wound care  3 times daily      Comments: Clean buttocks/sacrum/coccyx with soap and water, dry and apply Gerhardt's Butt Cream to entire area 3 times a day and prn soiling.   09/24/22 1035            No Known Allergies   Other Procedures/Studies: DG Swallowing Func-Speech Pathology  Result Date: 09/22/2022 Table formatting from the original result was not included. Modified Barium Swallow Study Patient Details Name: Gad Emge MRN: [id number] Date of Birth: 1955-11-02 Today's Date: 09/22/2022 HPI/PMH: HPI: Pt is a 67 y.o. male who presented to [facility] on 09/15/22 with AMS and seizure-like activity. Pt was sedated and intubated then transferred to [facility] for LTM. 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 NPO diet ordered by ST on 09/07. He has since weaned off norepinephrine and was transferred out of ICU on 9/9.  PMH is significant for HTN, CHF, CAD s/p stenting and PPM, COPD, EtOH dependence. Clinical Impression: Clinical Impression: Pt presents with an oral dysphagia primarily characterized by poor bolus awareness, control, holding, and mastication. No s/sx of aspiration or penetration were observed during this evaluation. Oral manipulation of bolus  was prolonged and disorganized with all consistencies administered, but was most impaired with solid. Mastication was incredibly prolonged. Pt appeared distractable during mastication and required one verbal cue to redirect attention back to swallow during solid administration. One instance of a delayed cough with no asipiration or penetration present. Trace residue observed on both oral cavity and pharyngeal structures. Airway protection was overall functional. Swallow was initiated at the level of the vallecula and epiglottic inversion wad complete. SLP reccommendation of Dys 1 (puree) and thin liquids with full supervsion to ensure pt does not become distracted.  SLP will continue to follow for diet toleration and upgraded PO trials. Factors that may increase risk of adverse event in presence of aspiration Rubye Oaks & Clearance Coots 2021): Factors that may increase risk of adverse event in presence of aspiration Rubye Oaks & Clearance Coots 2021): Reduced cognitive function Recommendations/Plan: Swallowing Evaluation Recommendations Swallowing Evaluation Recommendations Recommendations: PO diet PO Diet Recommendation: Dysphagia 1 (Pureed); Thin liquids (Level 0) Liquid Administration via: Cup; Straw; Spoon Medication Administration: Crushed with puree Supervision: Full supervision/cueing for swallowing strategies Swallowing strategies  : Slow rate; Small bites/sips Postural changes: Position pt fully upright for meals Oral care recommendations: Oral care BID (2x/day); Staff/trained caregiver to provide oral care Treatment Plan Treatment Plan Treatment recommendations: Therapy as outlined in treatment plan below Follow-up recommendations: Acute inpatient rehab (3 hours/day) Functional status assessment: Patient has had a recent decline in their functional status and demonstrates the ability to make significant improvements in function in a reasonable and predictable amount of time. Treatment frequency: Min 2x/week Treatment duration:  2 weeks Interventions: Aspiration precaution training; Diet toleration management by SLP; Patient/family education; Trials of upgraded texture/liquids Recommendations Recommendations for follow up therapy are one component of a multi-disciplinary discharge planning process, led by the attending physician.  Recommendations may be updated based on patient status, additional functional criteria and insurance authorization. Assessment: Orofacial Exam: Orofacial Exam Oral Cavity: Oral Hygiene: WFL Oral Cavity - Dentition: Edentulous Orofacial Anatomy: WFL Oral Motor/Sensory Function: WFL Anatomy: Anatomy: WFL Boluses Administered: Boluses Administered Boluses Administered: Thin liquids (Level 0); Mildly thick liquids (Level 2, nectar thick); Moderately thick liquids (Level 3, honey thick); Puree; Solid  Oral Impairment Domain: Oral Impairment Domain Bolus preparation/mastication: Slow prolonged chewing/mashing with complete recollection Bolus transport/lingual motion: Delayed initiation of tongue motion (oral holding); Slow tongue motion Oral residue: Complete oral clearance Initiation of pharyngeal swallow : Valleculae  Pharyngeal Impairment Domain: Pharyngeal Impairment Domain Soft palate elevation: No bolus between soft palate (SP)/pharyngeal wall (PW) Laryngeal elevation: Complete superior movement of thyroid cartilage with complete approximation of arytenoids to epiglottic petiole Anterior hyoid excursion: Complete anterior movement Epiglottic movement: Complete inversion Laryngeal vestibule closure: Complete, no air/contrast in laryngeal vestibule Pharyngeal stripping wave : Present - complete Pharyngeal contraction (A/P view only): N/A Pharyngoesophageal segment opening: Complete distension and complete duration, no obstruction of flow Tongue base retraction: No contrast between tongue base and posterior pharyngeal wall (PPW) Pharyngeal residue: Trace residue within or on pharyngeal structures Location of  pharyngeal residue: Valleculae; Pharyngeal wall  Esophageal Impairment Domain: Esophageal Impairment Domain Esophageal clearance upright position: Complete clearance, esophageal coating Pill: No data recorded Penetration/Aspiration Scale Score: Penetration/Aspiration Scale Score 1.  Material does not enter airway: Thin liquids (Level 0); Mildly thick liquids (Level 2, nectar thick); Solid; Puree; Moderately thick liquids (Level 3, honey thick) Compensatory Strategies: Compensatory Strategies Compensatory strategies: Yes Straw: Ineffective Ineffective Straw: Thin liquid (Level 0)   General Information: Caregiver present: No  Diet Prior to this Study: NPO; Cortrak/Small bore NG tube   Temperature : Normal   Respiratory Status: WFL   Supplemental O2: Nasal cannula   History of Recent Intubation: Yes  Behavior/Cognition: Requires cueing; Cooperative; Alert Self-Feeding Abilities: Needs assist with self-feeding Baseline vocal quality/speech: Normal No data recorded Volitional Swallow: Able to elicit Exam Limitations: Poor positioning Goal Planning: Prognosis for improved oropharyngeal function: Good No data recorded No data recorded Patient/Family Stated Goal: not stated Consulted and agree with results and recommendations: Pt unable/family or caregiver not available Pain: Pain Assessment Pain Assessment: No/denies pain Faces Pain Scale: 0  Facial Expression: 0 Body Movements: 0 Muscle Tension: 0 Compliance with ventilator (intubated pts.): N/A Vocalization (extubated pts.): 0 CPOT Total: 0 Pain Location: tingling in LLE Pain Descriptors / Indicators: Discomfort Pain Intervention(s): Limited activity within patient's tolerance; Monitored during session End of Session: Start Time:SLP Start Time (ACUTE ONLY): 1330 Stop Time: SLP Stop Time (ACUTE ONLY): 1340 Time Calculation:SLP Time Calculation (min) (ACUTE ONLY): 10 min Charges: SLP Evaluations $ SLP Speech Visit: 1 Visit SLP Evaluations $MBS Swallow: 1 Procedure $ SLP  EVAL LANGUAGE/SOUND PRODUCTION: 1 Procedure $Swallowing Treatment: 1 Procedure SLP visit diagnosis: SLP Visit Diagnosis: Dysphagia, oral phase (R13.11) Past Medical History: Past Medical History: Diagnosis Date  Myocardial infarction (HCC) 1989 Past Surgical History: Past Surgical History: Procedure Laterality Date  FEMORAL-POPLITEAL BYPASS GRAFT Bilateral 2021  GALLBLADDER SURGERY  2019  VASECTOMY  1992 Angela Nevin, MA, CCC-SLP Speech Therapy   DG CHEST PORT 1 VIEW  Result Date: 09/20/2022 CLINICAL DATA:  Aspiration pneumonia. EXAM: PORTABLE CHEST 1 VIEW COMPARISON:  September 16, 2022. FINDINGS: Stable cardiomediastinal silhouette. Feeding tube is seen entering stomach. Left-sided defibrillator is unchanged. Increased bibasilar atelectasis or infiltrates are noted with possible minimal left pleural effusion. Bony thorax is unremarkable. IMPRESSION: Increased bibasilar atelectasis or infiltrates are noted with possible minimal left pleural effusion. Electronically Signed   By: Lupita Raider M.D.   On: 09/20/2022 09:49   [place] EKG SITE RITE  Result Date: 09/19/2022 If Site Rite image not attached, placement could not be confirmed due to current cardiac rhythm.  CT ANGIO HEAD NECK W WO CM  Result Date: 09/19/2022 CLINICAL DATA:  Stroke/TIA, determine embolic source EXAM: CT ANGIOGRAPHY HEAD AND NECK WITH AND WITHOUT CONTRAST TECHNIQUE: Multidetector CT imaging of the head and neck was performed using the standard protocol during bolus administration of intravenous contrast. Multiplanar CT image reconstructions and MIPs were obtained to evaluate the vascular anatomy. Carotid stenosis measurements (when applicable) are obtained utilizing NASCET criteria, using the distal internal carotid diameter as the denominator. RADIATION DOSE REDUCTION: This exam was performed according to the departmental dose-optimization program which includes automated exposure control, adjustment of the mA and/or kV according to  patient size and/or use of iterative reconstruction technique. CONTRAST:  75mL OMNIPAQUE IOHEXOL 350 MG/ML SOLN COMPARISON:  Brain MR 09/18/2022 FINDINGS: CT HEAD FINDINGS Brain: No evidence of acute infarction, hemorrhage, hydrocephalus, extra-axial collection or mass lesion/mass effect. Previously seen infarcts in the right parietal lobe are not visualized on this exam due to CT technique. Vascular: See below Skull: Normal. Negative for fracture or focal lesion. Sinuses/Orbits: No middle ear or mastoid effusion. Mucosal thickening left maxillary sinus with osseous changes suggestive of chronic left maxillary sinusitis. Orbits are unremarkable. Other: None. Review of the MIP images confirms the above findings CTA NECK FINDINGS Aortic arch: Standard branching. Imaged portion shows no evidence of aneurysm or dissection. No significant stenosis of the major arch vessel origins. Right carotid system: No evidence of dissection, stenosis (50% or greater), or occlusion. Mild narrowing of the origin of the ICA secondary to calcified atherosclerotic plaque. Left carotid system: No evidence of dissection, stenosis (50% or greater), or occlusion. Mild narrowing of the origin of the ICA secondary to calcified atherosclerotic plaque. Vertebral arteries: Codominant. No evidence of dissection, stenosis (50% or greater), or occlusion. Skeleton: Degenerative endplate changes of the inferior and superior endplates of C6 on C7. Patient is edentulous. Other neck: Negative. Upper chest: Small bilateral pleural effusions, right-greater-than-left. An area of ground-glass opacity in the right upper  lobe, which is suspicious for an underlying infectious or inflammatory process. Partially imaged enteric tube in place. Left-sided cardiac device in place. No pneumothorax. Review of the MIP images confirms the above findings CTA HEAD FINDINGS Anterior circulation: No significant stenosis, proximal occlusion, aneurysm, or vascular malformation.  Mild narrowing of the cavernous segments of bilateral ICAs. Posterior circulation: No significant stenosis, proximal occlusion, aneurysm, or vascular malformation. Venous sinuses: As permitted by contrast timing, patent. Anatomic variants: None Review of the MIP images confirms the above findings IMPRESSION: 1. No acute intracranial abnormality. Known infarcts in the right parietal lobe are not visualized on this exam due to CT technique. 2. No intracranial large vessel occlusion or significant stenosis. 3. No hemodynamically significant stenosis in the neck. 4. Small bilateral pleural effusions, right-greater-than-left. An area of ground-glass opacity in the right upper lobe is suspicious for an underlying infectious or inflammatory process. Electronically Signed   By: Lorenza Cambridge M.D.   On: 09/19/2022 17:30   MR BRAIN WO CONTRAST  Result Date: 09/18/2022 CLINICAL DATA:  Seizure disorder, clinical change EXAM: MRI HEAD WITHOUT CONTRAST TECHNIQUE: Multiplanar, multiecho pulse sequences of the brain and surrounding structures were obtained without intravenous contrast. COMPARISON:  CT Head and C spine 09/15/22 FINDINGS: Motion degraded exam no definite evidence of hemorrhage when accounting for the degree of motion artifact. There is sequela of mild chronic microvascular ischemic change. Age advanced generalized volume loss. 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 Vascular: Normal flow voids. Skull and upper cervical spine: Normal marrow signal. Sinuses/Orbits: No middle ear or mastoid effusion. Mucosal thickening left maxillary sinus. Orbits are unremarkable. Other: None. IMPRESSION: Motion degraded exam. 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 No definite evidence of hemorrhage. These results will be called to the ordering clinician or representative by the Radiologist Assistant, and communication documented in the PACS or [employer]. Electronically Signed   By: Lorenza Cambridge  M.D.   On: 09/18/2022 18:26   ECHOCARDIOGRAM COMPLETE  Result Date: 09/16/2022    ECHOCARDIOGRAM REPORT   Patient Name:   EMRICK SPIRES Date of Exam: 09/16/2022 Medical Rec #:  [id number]    Height:       70.0 in Accession #:    [id number]   Weight:       122.4 lb Date of Birth:  23-Feb-1955    BSA:          1.694 m Patient Age:    67 years     BP:           111/60 mmHg Patient Gender: M            HR:           75 bpm. Exam Location:  Inpatient Procedure: 2D Echo, Cardiac Doppler, Color Doppler and Intracardiac            Opacification Agent Indications:    Elevated Troponin  History:        Patient has no prior history of Echocardiogram examinations.                 Previous Myocardial Infarction.  Sonographer:    Darlys Gales Referring Phys: [id number] RAVI AGARWALA  Sonographer Comments: Echo performed with patient supine and on artificial respirator. Image acquisition challenging due to patient body habitus. IMPRESSIONS  1. Left ventricular ejection fraction, by estimation, is 60 to 65%. The left ventricle has normal function. The left ventricle has no regional wall motion abnormalities. Left ventricular diastolic parameters  are consistent with Grade II diastolic dysfunction (pseudonormalization).  2. Right ventricular systolic function was not well visualized. The right ventricular size is not well visualized. Tricuspid regurgitation signal is inadequate for assessing PA pressure.  3. The mitral valve was not well visualized. No evidence of mitral valve regurgitation.  4. The aortic valve was not well visualized. Aortic valve regurgitation is not visualized.  5. Aortic not well visualized.  6. The inferior vena cava IVC not well visualized. FINDINGS  Left Ventricle: Left ventricular ejection fraction, by estimation, is 60 to 65%. The left ventricle has normal function. The left ventricle has no regional wall motion abnormalities. Definity contrast agent was given IV to delineate the left ventricular  endocardial  borders. The left ventricular internal cavity size was normal in size. There is no left ventricular hypertrophy. Left ventricular diastolic parameters are consistent with Grade II diastolic dysfunction (pseudonormalization). Right Ventricle: The right ventricular size is not well visualized. Right ventricular systolic function was not well visualized. Tricuspid regurgitation signal is inadequate for assessing PA pressure. Left Atrium: Left atrial size was normal in size. Right Atrium: Right atrial size was not well visualized. Pericardium: There is no evidence of pericardial effusion. Mitral Valve: The mitral valve was not well visualized. No evidence of mitral valve regurgitation. Tricuspid Valve: Tricuspid valve regurgitation is not demonstrated. Aortic Valve: The aortic valve was not well visualized. Aortic valve regurgitation is not visualized. Aortic valve peak gradient measures 2.5 mmHg. Pulmonic Valve: Pulmonic valve regurgitation is not visualized. Aorta: Not well visualized. Venous: The inferior vena cava IVC not well visualized. IAS/Shunts: The interatrial septum was not well visualized.  LEFT VENTRICLE PLAX 2D LVOT diam:     2.00 cm   Diastology LVOT Area:     3.14 cm  LV e' medial:    4.13 cm/s                          LV E/e' medial:  20.4                          LV e' lateral:   7.94 cm/s                          LV E/e' lateral: 10.6  LEFT ATRIUM           Index LA Vol (A4C): 44.5 ml 26.27 ml/m  AORTIC VALVE AV Vmax:      78.40 cm/s AV Peak Grad: 2.5 mmHg MITRAL VALVE MV Area (PHT): 3.28 cm    SHUNTS MV Decel Time: 231 msec    Systemic Diam: 2.00 cm MV E velocity: 84.40 cm/s MV A velocity: 83.80 cm/s MV E/A ratio:  1.01 Halford Decamp signed by Carolan Clines Signature Date/Time: 09/16/2022/3:52:05 PM    Final    DG Abd Portable 1V  Result Date: 09/16/2022 CLINICAL DATA:  Feeding tube placement. EXAM: PORTABLE ABDOMEN - 1 VIEW COMPARISON:  None Available. FINDINGS: Distal tip of feeding  tube is seen in expected position of proximal stomach. IMPRESSION: Distal tip of feeding tube seen in expected position of proximal stomach. Electronically Signed   By: Lupita Raider M.D.   On: 09/16/2022 13:42   Overnight EEG with video  Result Date: 09/16/2022 Charlsie Quest, MD     09/17/2022  9:01 AM Patient Name: Encarnacion Degruy MRN: [id number] Epilepsy Attending: Charlsie Quest Referring Physician/Provider:  Rejeana Brock, MD Duration: 09/15/2022 2306 to 09/16/2022 2306 Patient history: 67 year old male with a history of heavy EtOH use, presenting to the ED after recurrent spells of tonic flexor posturing of BUE with unresponsiveness. The posturing spells were preceded by a 3 day history of malaise, decreased PO intake and diarrhea. Last drink of Bourbon, per wife, was estimated to have been on Friday. EEG to evaluate for seizure Level of alertness: comatose AEDs during EEG study: LEV, Versed Technical aspects: This EEG study was done with scalp electrodes positioned according to the 10-20 International system of electrode placement. Electrical activity was reviewed with band pass filter of 1-70Hz , sensitivity of 7 uV/mm, display speed of 52mm/sec with a 60Hz  notched filter applied as appropriate. EEG data were recorded continuously and digitally stored.  Video monitoring was available and reviewed as appropriate. Description: EEG showed continuous generalized 3 to 6 Hz theta-delta slowing admixed with 15 to 18 Hz beta activity distributed symmetrically and diffusely. Hyperventilation and photic stimulation were not performed.   ABNORMALITY - Continuous slow, generalized IMPRESSION: This study is suggestive of severe diffuse encephalopathy likely related to sedation. No seizures or epileptiform discharges were seen throughout the recording. Charlsie Quest   DG Chest Port 1 View  Result Date: 09/16/2022 CLINICAL DATA:  67 year old male with history of respiratory failure. EXAM: PORTABLE CHEST 1  VIEW COMPARISON:  Chest x-ray 09/15/2022. FINDINGS: An endotracheal tube is in place with tip 4.4 cm above the carina. A nasogastric tube is seen extending into the stomach,, with the tip in the antral pre-pyloric region of the stomach Lung volumes are low. No consolidative airspace disease. No pleural effusions. No pneumothorax. No pulmonary nodule or mass noted. Pulmonary vasculature and the cardiomediastinal silhouette are within normal limits. Atherosclerotic calcifications are noted in the thoracic aorta. Left-sided biventricular pacemaker/AICD with lead tips projecting over the expected location of the right atrium, right ventricle and overlying the left ventricle via the coronary sinus and coronary veins. IMPRESSION: 1. Support apparatus, as above. 2. Low lung volumes without radiographic evidence of acute cardiopulmonary disease. 3. Aortic atherosclerosis. Electronically Signed   By: Trudie Reed M.D.   On: 09/16/2022 07:15   DG CHEST PORT 1 VIEW  Result Date: 09/15/2022 CLINICAL DATA:  Endotracheal tube placement. EXAM: PORTABLE CHEST 1 VIEW COMPARISON:  Radiograph earlier today FINDINGS: Endotracheal tube tip is 4.2 cm above the carina. Enteric tube tip below the diaphragm, not included in the field of view. Enteric tube is been advanced from earlier today. Multi lead left-sided pacemaker remains in place. Anti lordotic positioning. Increasing streaky opacities at the left lung base. Stable heart size and mediastinal contours. No pleural fluid or pneumothorax. No acute osseous findings. IMPRESSION: 1. Endotracheal tube tip 4.2 cm above the carina. Enteric tube tip below the diaphragm, not included in the field of view. 2. Increasing streaky opacities at the left lung base, favor atelectasis. Electronically Signed   By: Narda Rutherford M.D.   On: 09/15/2022 23:20   DG Chest Port 1 View  Result Date: 09/15/2022 CLINICAL DATA:  Airway intubation performed without difficulty. Tube placement  verification. EXAM: PORTABLE CHEST 1 VIEW COMPARISON:  07/01/2021 FINDINGS: Endotracheal tube is present with tip measuring 7 cm above the carina. Enteric tube is present with tip projecting over the left upper quadrant consistent with location in the upper stomach. Proximal side hole projects over the lower esophageal region. Advancement is suggested. Heart size and pulmonary vascularity are normal. Lungs are clear. No  pleural effusions. No pneumothorax. Mediastinal contours appear intact. Cardiac pacemaker. Calcification of the aorta. Degenerative changes in the spine and shoulders. IMPRESSION: 1. Endotracheal tube tip is above the carina. 2. Enteric tube is somewhat shallow with tip over the upper stomach but proximal side hole over the distal esophagus. Consider advancement. 3. Lungs are clear. Electronically Signed   By: Burman Nieves M.D.   On: 09/15/2022 15:40   CT HEAD WO CONTRAST ( )  Result Date: 09/15/2022 CLINICAL DATA:  Neck trauma (Age >= 65y); Head trauma, minor (Age >= 65y) EXAM: CT HEAD WITHOUT CONTRAST CT CERVICAL SPINE WITHOUT CONTRAST TECHNIQUE: Multidetector CT imaging of the head and cervical spine was performed following the standard protocol without intravenous contrast. Multiplanar CT image reconstructions of the cervical spine were also generated. RADIATION DOSE REDUCTION: This exam was performed according to the departmental dose-optimization program which includes automated exposure control, adjustment of the mA and/or kV according to patient size and/or use of iterative reconstruction technique. COMPARISON:  None Available. FINDINGS: CT HEAD FINDINGS Brain: No evidence of acute infarction, hemorrhage, hydrocephalus, extra-axial collection or mass lesion/mass effect. Vascular: No hyperdense vessel or unexpected calcification. Skull: Mild soft tissue swelling along the left frontal scalp. No evidence of an underlying calvarial fracture. Sinuses/Orbits: No middle ear or mastoid  effusion. Complete opacification of the left maxillary sinus with osseous changes suggestive of chronic left maxillary sinusitis. Orbits are unremarkable. Other: None. CT CERVICAL SPINE FINDINGS Alignment: Grade 1 anterolisthesis of C7 on T1. Skull base and vertebrae: No acute fracture. No primary bone lesion or focal pathologic process. Soft tissues and spinal canal: No prevertebral fluid or swelling. No visible canal hematoma. Disc levels:  No evidence of high-grade spinal canal stenosis. Upper chest: Negative. Other: None IMPRESSION: 1. No acute intracranial abnormality. 2. Mild soft tissue swelling along the left frontal scalp. No evidence of an underlying calvarial fracture. 3. No acute fracture or traumatic subluxation of the cervical spine. Electronically Signed   By: Lorenza Cambridge M.D.   On: 09/15/2022 15:26   CT Cervical Spine Wo Contrast  Result Date: 09/15/2022 CLINICAL DATA:  Neck trauma (Age >= 65y); Head trauma, minor (Age >= 65y) EXAM: CT HEAD WITHOUT CONTRAST CT CERVICAL SPINE WITHOUT CONTRAST TECHNIQUE: Multidetector CT imaging of the head and cervical spine was performed following the standard protocol without intravenous contrast. Multiplanar CT image reconstructions of the cervical spine were also generated. RADIATION DOSE REDUCTION: This exam was performed according to the departmental dose-optimization program which includes automated exposure control, adjustment of the mA and/or kV according to patient size and/or use of iterative reconstruction technique. COMPARISON:  None Available. FINDINGS: CT HEAD FINDINGS Brain: No evidence of acute infarction, hemorrhage, hydrocephalus, extra-axial collection or mass lesion/mass effect. Vascular: No hyperdense vessel or unexpected calcification. Skull: Mild soft tissue swelling along the left frontal scalp. No evidence of an underlying calvarial fracture. Sinuses/Orbits: No middle ear or mastoid effusion. Complete opacification of the left maxillary  sinus with osseous changes suggestive of chronic left maxillary sinusitis. Orbits are unremarkable. Other: None. CT CERVICAL SPINE FINDINGS Alignment: Grade 1 anterolisthesis of C7 on T1. Skull base and vertebrae: No acute fracture. No primary bone lesion or focal pathologic process. Soft tissues and spinal canal: No prevertebral fluid or swelling. No visible canal hematoma. Disc levels:  No evidence of high-grade spinal canal stenosis. Upper chest: Negative. Other: None IMPRESSION: 1. No acute intracranial abnormality. 2. Mild soft tissue swelling along the left frontal scalp. No evidence of an underlying calvarial  fracture. 3. No acute fracture or traumatic subluxation of the cervical spine. Electronically Signed   By: Lorenza Cambridge M.D.   On: 09/15/2022 15:26     TODAY-DAY OF DISCHARGE:  Subjective:   Howell Pringle today has no headache,no chest abdominal pain,no new weakness tingling or numbness, feels much better wants to go home today.   Objective:   Blood pressure 104/68, pulse (!) 107, temperature 98.4 F (36.9 C), temperature source Oral, resp. rate 20, height 5\' 10"  (1.778 m), weight 57.4 kg, SpO2 97%.  Intake/Output Summary (Last 24 hours) at 09/24/2022 1036 Last data filed at 09/24/2022 0954 Gross per 24 hour  Intake 3 ml  Output 1350 ml  Net -1347 ml   Filed Weights   09/22/22 0341 09/23/22 0400 09/24/22 0500  Weight: 65.4 kg 59.6 kg 57.4 kg    Exam: Awake Alert, Oriented *3, No new F.N deficits, Normal affect Felicity.AT,PERRAL Supple Neck,No JVD, No cervical lymphadenopathy appriciated.  Symmetrical Chest wall movement, Good air movement bilaterally, CTAB RRR,No Gallops,Rubs or new Murmurs, No Parasternal Heave +ve B.Sounds, Abd Soft, Non tender, No organomegaly appriciated, No rebound -guarding or rigidity. No Cyanosis, Clubbing or edema, No new Rash or bruise   PERTINENT RADIOLOGIC STUDIES: DG Swallowing Func-Speech Pathology  Result Date: 09/22/2022 Table formatting  from the original result was not included. Modified Barium Swallow Study Patient Details Name: Mchale Heinzmann MRN: [id number] Date of Birth: November 02, 1955 Today's Date: 09/22/2022 HPI/PMH: HPI: Pt is a 67 y.o. male who presented to [facility] on 09/15/22 with AMS and seizure-like activity. Pt was sedated and intubated then transferred to [facility] for LTM. 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 NPO diet ordered by ST on 09/07. He has since weaned off norepinephrine and was transferred out of ICU on 9/9.  PMH is significant for HTN, CHF, CAD s/p stenting and PPM, COPD, EtOH dependence. Clinical Impression: Clinical Impression: Pt presents with an oral dysphagia primarily characterized by poor bolus awareness, control, holding, and mastication. No s/sx of aspiration or penetration were observed during this evaluation. Oral manipulation of bolus was prolonged and disorganized with all consistencies administered, but was most impaired with solid. Mastication was incredibly prolonged. Pt appeared distractable during mastication and required one verbal cue to redirect attention back to swallow during solid administration. One instance of a delayed cough with no asipiration or penetration present. Trace residue observed on both oral cavity and pharyngeal structures. Airway protection was overall functional. Swallow was initiated at the level of the vallecula and epiglottic inversion wad complete. SLP reccommendation of Dys 1 (puree) and thin liquids with full supervsion to ensure pt does not become distracted. SLP will continue to follow for diet toleration and upgraded PO trials. Factors that may increase risk of adverse event in presence of aspiration Rubye Oaks & Clearance Coots 2021): Factors that may increase risk of adverse event in presence of aspiration Rubye Oaks & Clearance Coots 2021): Reduced cognitive function Recommendations/Plan: Swallowing Evaluation Recommendations Swallowing Evaluation  Recommendations Recommendations: PO diet PO Diet Recommendation: Dysphagia 1 (Pureed); Thin liquids (Level 0) Liquid Administration via: Cup; Straw; Spoon Medication Administration: Crushed with puree Supervision: Full supervision/cueing for swallowing strategies Swallowing strategies  : Slow rate; Small bites/sips Postural changes: Position pt fully upright for meals Oral care recommendations: Oral care BID (2x/day); Staff/trained caregiver to provide oral care Treatment Plan Treatment Plan Treatment recommendations: Therapy as outlined in treatment plan below Follow-up recommendations: Acute inpatient rehab (3 hours/day) Functional status assessment: Patient  has had a recent decline in their functional status and demonstrates the ability to make significant improvements in function in a reasonable and predictable amount of time. Treatment frequency: Min 2x/week Treatment duration: 2 weeks Interventions: Aspiration precaution training; Diet toleration management by SLP; Patient/family education; Trials of upgraded texture/liquids Recommendations Recommendations for follow up therapy are one component of a multi-disciplinary discharge planning process, led by the attending physician.  Recommendations may be updated based on patient status, additional functional criteria and insurance authorization. Assessment: Orofacial Exam: Orofacial Exam Oral Cavity: Oral Hygiene: WFL Oral Cavity - Dentition: Edentulous Orofacial Anatomy: WFL Oral Motor/Sensory Function: WFL Anatomy: Anatomy: WFL Boluses Administered: Boluses Administered Boluses Administered: Thin liquids (Level 0); Mildly thick liquids (Level 2, nectar thick); Moderately thick liquids (Level 3, honey thick); Puree; Solid  Oral Impairment Domain: Oral Impairment Domain Bolus preparation/mastication: Slow prolonged chewing/mashing with complete recollection Bolus transport/lingual motion: Delayed initiation of tongue motion (oral holding); Slow tongue motion Oral  residue: Complete oral clearance Initiation of pharyngeal swallow : Valleculae  Pharyngeal Impairment Domain: Pharyngeal Impairment Domain Soft palate elevation: No bolus between soft palate (SP)/pharyngeal wall (PW) Laryngeal elevation: Complete superior movement of thyroid cartilage with complete approximation of arytenoids to epiglottic petiole Anterior hyoid excursion: Complete anterior movement Epiglottic movement: Complete inversion Laryngeal vestibule closure: Complete, no air/contrast in laryngeal vestibule Pharyngeal stripping wave : Present - complete Pharyngeal contraction (A/P view only): N/A Pharyngoesophageal segment opening: Complete distension and complete duration, no obstruction of flow Tongue base retraction: No contrast between tongue base and posterior pharyngeal wall (PPW) Pharyngeal residue: Trace residue within or on pharyngeal structures Location of pharyngeal residue: Valleculae; Pharyngeal wall  Esophageal Impairment Domain: Esophageal Impairment Domain Esophageal clearance upright position: Complete clearance, esophageal coating Pill: No data recorded Penetration/Aspiration Scale Score: Penetration/Aspiration Scale Score 1.  Material does not enter airway: Thin liquids (Level 0); Mildly thick liquids (Level 2, nectar thick); Solid; Puree; Moderately thick liquids (Level 3, honey thick) Compensatory Strategies: Compensatory Strategies Compensatory strategies: Yes Straw: Ineffective Ineffective Straw: Thin liquid (Level 0)   General Information: Caregiver present: No  Diet Prior to this Study: NPO; Cortrak/Small bore NG tube   Temperature : Normal   Respiratory Status: WFL   Supplemental O2: Nasal cannula   History of Recent Intubation: Yes  Behavior/Cognition: Requires cueing; Cooperative; Alert Self-Feeding Abilities: Needs assist with self-feeding Baseline vocal quality/speech: Normal No data recorded Volitional Swallow: Able to elicit Exam Limitations: Poor positioning Goal Planning:  Prognosis for improved oropharyngeal function: Good No data recorded No data recorded Patient/Family Stated Goal: not stated Consulted and agree with results and recommendations: Pt unable/family or caregiver not available Pain: Pain Assessment Pain Assessment: No/denies pain Faces Pain Scale: 0 Facial Expression: 0 Body Movements: 0 Muscle Tension: 0 Compliance with ventilator (intubated pts.): N/A Vocalization (extubated pts.): 0 CPOT Total: 0 Pain Location: tingling in LLE Pain Descriptors / Indicators: Discomfort Pain Intervention(s): Limited activity within patient's tolerance; Monitored during session End of Session: Start Time:SLP Start Time (ACUTE ONLY): 1330 Stop Time: SLP Stop Time (ACUTE ONLY): 1340 Time Calculation:SLP Time Calculation (min) (ACUTE ONLY): 10 min Charges: SLP Evaluations $ SLP Speech Visit: 1 Visit SLP Evaluations $MBS Swallow: 1 Procedure $ SLP EVAL LANGUAGE/SOUND PRODUCTION: 1 Procedure $Swallowing Treatment: 1 Procedure SLP visit diagnosis: SLP Visit Diagnosis: Dysphagia, oral phase (R13.11) Past Medical History: Past Medical History: Diagnosis Date  Myocardial infarction (HCC) 1989 Past Surgical History: Past Surgical History: Procedure Laterality Date  FEMORAL-POPLITEAL BYPASS GRAFT Bilateral 2021  GALLBLADDER SURGERY  2019  VASECTOMY  1992 Angela Nevin, MA, CCC-SLP Speech Therapy     PERTINENT LAB RESULTS: CBC: Recent Labs    09/23/22 0647 09/24/22 0358  WBC 7.6 7.4  HGB 7.0* 6.9*  HCT 21.1* 21.3*  PLT 190 244   CMET CMP     Component Value Date/Time   NA 139 09/24/2022 0358   K 5.2 (H) 09/24/2022 0358   CL 112 (H) 09/24/2022 0358   CO2 21 (L) 09/24/2022 0358   GLUCOSE 133 (H) 09/24/2022 0358   BUN 44 (H) 09/24/2022 0358   CREATININE 1.40 (H) 09/24/2022 0358   CALCIUM 8.3 (L) 09/24/2022 0358   PROT 5.5 (L) 09/19/2022 1351   ALBUMIN 2.0 (L) 09/19/2022 1351   AST 19 09/19/2022 1351   ALT 11 09/19/2022 1351   ALKPHOS 79 09/19/2022 1351   BILITOT 0.7  09/19/2022 1351   GFRNONAA 55 (L) 09/24/2022 0358    GFR Estimated Creatinine Clearance: 42.1 mL/min (A) (by C-G formula based on SCr of 1.4 mg/dL (H)). No results for input(s): "LIPASE", "AMYLASE" in the last 72 hours. No results for input(s): "CKTOTAL", "CKMB", "CKMBINDEX", "TROPONINI" in the last 72 hours. Invalid input(s): "POCBNP" No results for input(s): "DDIMER" in the last 72 hours. No results for input(s): "HGBA1C" in the last 72 hours. No results for input(s): "CHOL", "HDL", "LDLCALC", "TRIG", "CHOLHDL", "LDLDIRECT" in the last 72 hours. No results for input(s): "TSH", "T4TOTAL", "T3FREE", "THYROIDAB" in the last 72 hours.  Invalid input(s): "FREET3" Recent Labs    09/24/22 0358  VITAMINB12 258  FOLATE 13.4  FERRITIN 2,896*  TIBC 147*  IRON 28*  RETICCTPCT 2.3   Coags: No results for input(s): "INR" in the last 72 hours.  Invalid input(s): "PT" Microbiology: Recent Results (from the past 240 hour(s))  Resp panel by RT-PCR (RSV, Flu A&B, Covid) Anterior Nasal Swab     Status: None   Collection Time: 09/15/22  3:00 PM   Specimen: Anterior Nasal Swab  Result Value Ref Range Status   SARS Coronavirus 2 by RT PCR NEGATIVE NEGATIVE Final    Comment: (NOTE) SARS-CoV-2 target nucleic acids are NOT DETECTED.  The SARS-CoV-2 RNA is generally detectable in upper respiratory specimens during the acute phase of infection. The lowest concentration of SARS-CoV-2 viral copies this assay can detect is 138 copies/mL. A negative result does not preclude SARS-Cov-2 infection and should not be used as the sole basis for treatment or other patient management decisions. A negative result may occur with  improper specimen collection/handling, submission of specimen other than nasopharyngeal swab, presence of viral mutation(s) within the areas targeted by this assay, and inadequate number of viral copies(<138 copies/mL). A negative result must be combined with clinical observations,  patient history, and epidemiological information. The expected result is Negative.  Fact Sheet for Patients:  BloggerCourse.com  Fact Sheet for Healthcare Providers:  SeriousBroker.it  This test is no t yet approved or cleared by the [place] FDA and  has been authorized for detection and/or diagnosis of SARS-CoV-2 by FDA under an Emergency Use Authorization (EUA). This EUA will remain  in effect (meaning this test can be used) for the duration of the COVID-19 declaration under Section 564(b)(1) of the Act, 21 U.S.C.section 360bbb-3(b)(1), unless the authorization is terminated  or revoked sooner.       Influenza A by PCR NEGATIVE NEGATIVE Final   Influenza B by PCR NEGATIVE NEGATIVE Final    Comment: (NOTE) The Xpert Xpress SARS-CoV-2/FLU/RSV plus assay is intended as  an aid in the diagnosis of influenza from Nasopharyngeal swab specimens and should not be used as a sole basis for treatment. Nasal washings and aspirates are unacceptable for Xpert Xpress SARS-CoV-2/FLU/RSV testing.  Fact Sheet for Patients: BloggerCourse.com  Fact Sheet for Healthcare Providers: SeriousBroker.it  This test is not yet approved or cleared by the [place] FDA and has been authorized for detection and/or diagnosis of SARS-CoV-2 by FDA under an Emergency Use Authorization (EUA). This EUA will remain in effect (meaning this test can be used) for the duration of the COVID-19 declaration under Section 564(b)(1) of the Act, 21 U.S.C. section 360bbb-3(b)(1), unless the authorization is terminated or revoked.     Resp Syncytial Virus by PCR NEGATIVE NEGATIVE Final    Comment: (NOTE) Fact Sheet for Patients: BloggerCourse.com  Fact Sheet for Healthcare Providers: SeriousBroker.it  This test is not yet approved or cleared by the [place] FDA and has been authorized for detection and/or diagnosis of SARS-CoV-2 by FDA under an Emergency Use Authorization (EUA). This EUA will remain in effect (meaning this test can be used) for the duration of the COVID-19 declaration under Section 564(b)(1) of the Act, 21 U.S.C. section 360bbb-3(b)(1), unless the authorization is terminated or revoked.  Performed at [facility], [address] [place], [place] [address]   MRSA Next Gen by PCR, Nasal     Status: None   Collection Time: 09/15/22  8:10 PM   Specimen: Nasal Mucosa; Nasal Swab  Result Value Ref Range Status   MRSA by PCR Next Gen NOT DETECTED NOT DETECTED Final    Comment: (NOTE) The GeneXpert MRSA Assay (FDA approved for NASAL specimens only), is one component of a comprehensive MRSA colonization surveillance program. It is not intended to diagnose MRSA infection nor to guide or monitor treatment for MRSA infections. Test performance is not FDA approved in patients less than 54 years old. Performed at [facility], [address] [place], [place] [address]   Culture, blood (Routine X 2) w Reflex to ID Panel     Status: None   Collection Time: 09/19/22  7:12 PM   Specimen: BLOOD RIGHT HAND  Result Value Ref Range Status   Specimen Description BLOOD RIGHT HAND  Final   Special Requests   Final    BOTTLES DRAWN AEROBIC AND ANAEROBIC Blood Culture adequate volume   Culture   Final    NO GROWTH 5 DAYS Performed at [facility], [address] [place], [place] [address]    Report Status 09/24/2022 FINAL  Final  Culture, blood (Routine X 2) w Reflex to ID Panel     Status: None   Collection Time: 09/19/22  7:14 PM   Specimen: BLOOD RIGHT ARM  Result Value Ref Range Status   Specimen Description BLOOD RIGHT ARM  Final   Special Requests   Final    BOTTLES DRAWN AEROBIC ONLY Blood Culture adequate volume   Culture   Final    NO GROWTH 5 DAYS Performed at [facility], [address], [place], [place] [address]    Report Status 09/24/2022 FINAL  Final  Expectorated Sputum Assessment w Gram Stain, Rflx to Resp Cult     Status: None   Collection Time: 09/19/22 10:19 PM   Specimen: Sputum  Result Value Ref Range Status   Specimen Description SPUTUM  Final   Special Requests NONE  Final   Sputum evaluation   Final    THIS  SPECIMEN IS ACCEPTABLE FOR SPUTUM CULTURE Performed at [facility], [address] [place], [place] [address]    Report Status 09/19/2022 FINAL  Final  Culture, Respiratory w Gram Stain     Status: None   Collection Time: 09/19/22 10:19 PM   Specimen: SPU  Result Value Ref Range Status   Specimen Description SPUTUM  Final   Special Requests NONE Reflexed from [id number]  Final   Gram Stain   Final    RARE WBC PRESENT, PREDOMINANTLY PMN RARE GRAM POSITIVE COCCI Performed at [facility], [address] [place], [place] [address]    Culture ABUNDANT STAPHYLOCOCCUS LUGDUNENSIS  Final   Report Status 09/22/2022 FINAL  Final   Organism ID, Bacteria STAPHYLOCOCCUS LUGDUNENSIS  Final      Susceptibility   Staphylococcus lugdunensis - MIC*    CIPROFLOXACIN <=0.5 SENSITIVE Sensitive     ERYTHROMYCIN <=0.25 SENSITIVE Sensitive     GENTAMICIN <=0.5 SENSITIVE Sensitive     OXACILLIN 2 SENSITIVE Sensitive     TETRACYCLINE <=1 SENSITIVE Sensitive     VANCOMYCIN <=0.5 SENSITIVE Sensitive     TRIMETH/SULFA <=10 SENSITIVE Sensitive     CLINDAMYCIN <=0.25 SENSITIVE Sensitive     RIFAMPIN <=0.5 SENSITIVE Sensitive     Inducible Clindamycin NEGATIVE Sensitive     * ABUNDANT STAPHYLOCOCCUS LUGDUNENSIS    FURTHER DISCHARGE INSTRUCTIONS:  Get Medicines reviewed and adjusted: Please take all your medications with you for your next visit with your Primary MD  Laboratory/radiological data: Please request your Primary MD to go over all hospital tests and procedure/radiological results at the follow up, please ask your Primary MD to get all  Hospital records sent to his/her office.  In some cases, they will be blood work, cultures and biopsy results pending at the time of your discharge. Please request that your primary care M.D. goes through all the records of your hospital data and follows up on these results.  Also Note the following: If you experience worsening of your admission symptoms, develop shortness of breath, life threatening emergency, suicidal or homicidal thoughts you must seek medical attention immediately by calling 911 or calling your MD immediately  if symptoms less severe.  You must read complete instructions/literature along with all the possible adverse reactions/side effects for all the Medicines you take and that have been prescribed to you. Take any new Medicines after you have completely understood and accpet all the possible adverse reactions/side effects.   Do not drive when taking Pain medications or sleeping medications (Benzodaizepines)  Do not take more than prescribed Pain, Sleep and Anxiety Medications. It is not advisable to combine anxiety,sleep and pain medications without talking with your primary care practitioner  Special Instructions: If you have smoked or chewed Tobacco  in the last 2 yrs please stop smoking, stop any regular Alcohol  and or any Recreational drug use.  Wear Seat belts while driving.  Please note: You were cared for by a hospitalist during your hospital stay. Once you are discharged, your primary care physician will handle any further medical issues. Please note that NO REFILLS for any discharge medications will be authorized once you are discharged, as it is imperative that you return to your primary care physician (or establish a relationship with a primary care physician if you do not have one) for your post hospital discharge needs so that they can reassess your need for medications and monitor your lab values.  Total Time spent coordinating discharge including counseling,  education and face to face time equals greater than 30 minutes.  SignedJeoffrey Massed 09/24/2022 10:36 AM

## 2022-09-25 ENCOUNTER — Other Ambulatory Visit: Payer: Self-pay

## 2022-09-25 ENCOUNTER — Encounter (HOSPITAL_COMMUNITY): Payer: Self-pay | Admitting: Physical Medicine and Rehabilitation

## 2022-09-25 LAB — TYPE AND SCREEN
ABO/RH(D): A POS
Antibody Screen: NEGATIVE
Unit division: 0

## 2022-09-25 LAB — BPAM RBC
Blood Product Expiration Date: 202409282359
ISSUE DATE / TIME: 202409121115
Unit Type and Rh: 6200

## 2022-09-25 LAB — COMPREHENSIVE METABOLIC PANEL
ALT: 105 U/L — ABNORMAL HIGH (ref 0–44)
AST: 85 U/L — ABNORMAL HIGH (ref 15–41)
Albumin: 2.1 g/dL — ABNORMAL LOW (ref 3.5–5.0)
Alkaline Phosphatase: 77 U/L (ref 38–126)
Anion gap: 11 (ref 5–15)
BUN: 37 mg/dL — ABNORMAL HIGH (ref 8–23)
CO2: 21 mmol/L — ABNORMAL LOW (ref 22–32)
Calcium: 8.8 mg/dL — ABNORMAL LOW (ref 8.9–10.3)
Chloride: 106 mmol/L (ref 98–111)
Creatinine, Ser: 1.36 mg/dL — ABNORMAL HIGH (ref 0.61–1.24)
GFR, Estimated: 57 mL/min — ABNORMAL LOW (ref >60)
Glucose, Bld: 120 mg/dL — ABNORMAL HIGH (ref 70–99)
Potassium: 5.2 mmol/L — ABNORMAL HIGH (ref 3.5–5.1)
Sodium: 138 mmol/L (ref 135–145)
Total Bilirubin: 0.5 mg/dL (ref 0.3–1.2)
Total Protein: 5.9 g/dL — ABNORMAL LOW (ref 6.5–8.1)

## 2022-09-25 LAB — CBC WITH DIFFERENTIAL/PLATELET
Abs Immature Granulocytes: 0.04 10*3/uL (ref 0.00–0.07)
Basophils Absolute: 0 10*3/uL (ref 0.0–0.1)
Basophils Relative: 0 %
Eosinophils Absolute: 0.2 10*3/uL (ref 0.0–0.5)
Eosinophils Relative: 3 %
HCT: 26.9 % — ABNORMAL LOW (ref 39.0–52.0)
Hemoglobin: 8.9 g/dL — ABNORMAL LOW (ref 13.0–17.0)
Immature Granulocytes: 0 %
Lymphocytes Relative: 9 %
Lymphs Abs: 0.9 10*3/uL (ref 0.7–4.0)
MCH: 32.5 pg (ref 26.0–34.0)
MCHC: 33.1 g/dL (ref 30.0–36.0)
MCV: 98.2 fL (ref 80.0–100.0)
Monocytes Absolute: 0.5 10*3/uL (ref 0.1–1.0)
Monocytes Relative: 5 %
Neutro Abs: 7.6 10*3/uL (ref 1.7–7.7)
Neutrophils Relative %: 83 %
Platelets: 317 10*3/uL (ref 150–400)
RBC: 2.74 MIL/uL — ABNORMAL LOW (ref 4.22–5.81)
RDW: 19.3 % — ABNORMAL HIGH (ref 11.5–15.5)
WBC: 9.2 10*3/uL (ref 4.0–10.5)
nRBC: 0 % (ref 0.0–0.2)

## 2022-09-25 LAB — GLUCOSE, CAPILLARY
Glucose-Capillary: 102 mg/dL — ABNORMAL HIGH (ref 70–99)
Glucose-Capillary: 106 mg/dL — ABNORMAL HIGH (ref 70–99)
Glucose-Capillary: 109 mg/dL — ABNORMAL HIGH (ref 70–99)
Glucose-Capillary: 111 mg/dL — ABNORMAL HIGH (ref 70–99)
Glucose-Capillary: 117 mg/dL — ABNORMAL HIGH (ref 70–99)
Glucose-Capillary: 117 mg/dL — ABNORMAL HIGH (ref 70–99)

## 2022-09-25 LAB — MAGNESIUM: Magnesium: 1.3 mg/dL — ABNORMAL LOW (ref 1.7–2.4)

## 2022-09-25 LAB — VITAMIN D 25 HYDROXY (VIT D DEFICIENCY, FRACTURES): Vit D, 25-Hydroxy: 25.77 ng/mL — ABNORMAL LOW (ref 30–100)

## 2022-09-25 LAB — PHOSPHORUS: Phosphorus: 2.3 mg/dL — ABNORMAL LOW (ref 2.5–4.6)

## 2022-09-25 MED ORDER — SODIUM ZIRCONIUM CYCLOSILICATE 5 G PO PACK
5.0000 g | PACK | Freq: Once | ORAL | Status: AC
  Administered 2022-09-25: 5 g via ORAL
  Filled 2022-09-25 (×2): qty 1

## 2022-09-25 MED ORDER — AMOXICILLIN-POT CLAVULANATE 875-125 MG PO TABS
1.0000 | ORAL_TABLET | Freq: Two times a day (BID) | ORAL | Status: AC
  Administered 2022-09-25 – 2022-09-26 (×4): 1 via ORAL
  Filled 2022-09-25 (×4): qty 1

## 2022-09-25 MED ORDER — MAGNESIUM SULFATE 2 GM/50ML IV SOLN
2.0000 g | Freq: Once | INTRAVENOUS | Status: AC
  Administered 2022-09-25: 2 g via INTRAVENOUS
  Filled 2022-09-25: qty 50

## 2022-09-25 MED ORDER — VITAL AF 1.2 CAL PO LIQD
1000.0000 mL | ORAL | Status: DC
  Administered 2022-09-25: 1000 mL
  Filled 2022-09-25 (×2): qty 1000

## 2022-09-25 NOTE — Plan of Care (Signed)
  Problem: RH Balance Goal: LTG Patient will maintain dynamic standing balance (PT) Description: LTG:  Patient will maintain dynamic standing balance with assistance during mobility activities (PT) Flowsheets (Taken 09/25/2022 1416) LTG: Pt will maintain dynamic standing balance during mobility activities with:: Supervision/Verbal cueing   Problem: Sit to Stand Goal: LTG:  Patient will perform sit to stand with assistance level (PT) Description: LTG:  Patient will perform sit to stand with assistance level (PT) Flowsheets (Taken 09/25/2022 1416) LTG: PT will perform sit to stand in preparation for functional mobility with assistance level: Supervision/Verbal cueing   Problem: RH Bed Mobility Goal: LTG Patient will perform bed mobility with assist (PT) Description: LTG: Patient will perform bed mobility with assistance, with/without cues (PT). Flowsheets (Taken 09/25/2022 1416) LTG: Pt will perform bed mobility with assistance level of: Supervision/Verbal cueing   Problem: RH Bed to Chair Transfers Goal: LTG Patient will perform bed/chair transfers w/assist (PT) Description: LTG: Patient will perform bed to chair transfers with assistance (PT). Flowsheets (Taken 09/25/2022 1416) LTG: Pt will perform Bed to Chair Transfers with assistance level: Supervision/Verbal cueing   Problem: RH Car Transfers Goal: LTG Patient will perform car transfers with assist (PT) Description: LTG: Patient will perform car transfers with assistance (PT). Flowsheets (Taken 09/25/2022 1416) LTG: Pt will perform car transfers with assist:: Supervision/Verbal cueing   Problem: RH Ambulation Goal: LTG Patient will ambulate in controlled environment (PT) Description: LTG: Patient will ambulate in a controlled environment, # of feet with assistance (PT). Flowsheets (Taken 09/25/2022 1416) LTG: Pt will ambulate in controlled environ  assist needed:: Supervision/Verbal cueing LTG: Ambulation distance in controlled  environment: 166ft Goal: LTG Patient will ambulate in home environment (PT) Description: LTG: Patient will ambulate in home environment, # of feet with assistance (PT). Flowsheets (Taken 09/25/2022 1416) LTG: Pt will ambulate in home environ  assist needed:: Supervision/Verbal cueing LTG: Ambulation distance in home environment: 63ft   Problem: RH Stairs Goal: LTG Patient will ambulate up and down stairs w/assist (PT) Description: LTG: Patient will ambulate up and down # of stairs with assistance (PT) Flowsheets (Taken 09/25/2022 1416) LTG: Pt will ambulate up/down stairs assist needed:: Contact Guard/Touching assist LTG: Pt will  ambulate up and down number of stairs: at least 4 with 1 railing per home setup

## 2022-09-25 NOTE — Progress Notes (Signed)
Inpatient Rehabilitation Center Individual Statement of Services  Patient Name:  Derek Oconnell  Date:  09/25/2022  Welcome to the Inpatient Rehabilitation Center.  Our goal is to provide you with an individualized program based on your diagnosis and situation, designed to meet your specific needs.  With this comprehensive rehabilitation program, you will be expected to participate in at least 3 hours of rehabilitation therapies Monday-Friday, with modified therapy programming on the weekends.  Your rehabilitation program will include the following services:  Physical Therapy (PT), Occupational Therapy (OT), Speech Therapy (ST), 24 hour per day rehabilitation nursing, Therapeutic Recreaction (TR), Neuropsychology, Care Coordinator, Rehabilitation Medicine, Nutrition Services, Pharmacy Services, and Other  Weekly team conferences will be held on Wesnedays to discuss your progress.  Your Inpatient Rehabilitation Care Coordinator will talk with you frequently to get your input and to update you on team discussions.  Team conferences with you and your family in attendance may also be held.  Expected length of stay: 7-10 Days  Overall anticipated outcome:  Supervision  Depending on your progress and recovery, your program may change. Your Inpatient Rehabilitation Care Coordinator will coordinate services and will keep you informed of any changes. Your Inpatient Rehabilitation Care Coordinator's name and contact numbers are listed  below.  The following services may also be recommended but are not provided by the Inpatient Rehabilitation Center:   Home Health Rehabiltiation Services Outpatient Rehabilitation Services    Arrangements will be made to provide these services after discharge if needed.  Arrangements include referral to agencies that provide these services.  Your insurance has been verified to be:  Medicare A & B Your primary doctor is:  Modena Slater, MD  Pertinent information will be  shared with your doctor and your insurance company.  Inpatient Rehabilitation Care Coordinator:  Dossie Der, [place] [phone] or [phone]  Information discussed with and copy given to patient by: Andria Rhein, 09/25/2022, 8:30 AM

## 2022-09-25 NOTE — Plan of Care (Signed)
  Problem: RH Balance Goal: LTG Patient will maintain dynamic standing with ADLs (OT) Description: LTG:  Patient will maintain dynamic standing balance with assist during activities of daily living (OT)  Flowsheets (Taken 09/25/2022 1326) LTG: Pt will maintain dynamic standing balance during ADLs with: Supervision/Verbal cueing   Problem: Sit to Stand Goal: LTG:  Patient will perform sit to stand in prep for activites of daily living with assistance level (OT) Description: LTG:  Patient will perform sit to stand in prep for activites of daily living with assistance level (OT) Flowsheets (Taken 09/25/2022 1326) LTG: PT will perform sit to stand in prep for activites of daily living with assistance level: Supervision/Verbal cueing   Problem: RH Bathing Goal: LTG Patient will bathe all body parts with assist levels (OT) Description: LTG: Patient will bathe all body parts with assist levels (OT) Flowsheets (Taken 09/25/2022 1326) LTG: Pt will perform bathing with assistance level/cueing: Supervision/Verbal cueing   Problem: RH Dressing Goal: LTG Patient will perform upper body dressing (OT) Description: LTG Patient will perform upper body dressing with assist, with/without cues (OT). Flowsheets (Taken 09/25/2022 1326) LTG: Pt will perform upper body dressing with assistance level of: Set up assist Goal: LTG Patient will perform lower body dressing w/assist (OT) Description: LTG: Patient will perform lower body dressing with assist, with/without cues in positioning using equipment (OT) Flowsheets (Taken 09/25/2022 1326) LTG: Pt will perform lower body dressing with assistance level of: Supervision/Verbal cueing   Problem: RH Toileting Goal: LTG Patient will perform toileting task (3/3 steps) with assistance level (OT) Description: LTG: Patient will perform toileting task (3/3 steps) with assistance level (OT)  Flowsheets (Taken 09/25/2022 1326) LTG: Pt will perform toileting task (3/3 steps)  with assistance level: Supervision/Verbal cueing   Problem: RH Toilet Transfers Goal: LTG Patient will perform toilet transfers w/assist (OT) Description: LTG: Patient will perform toilet transfers with assist, with/without cues using equipment (OT) Flowsheets (Taken 09/25/2022 1326) LTG: Pt will perform toilet transfers with assistance level of: Supervision/Verbal cueing   Problem: RH Tub/Shower Transfers Goal: LTG Patient will perform tub/shower transfers w/assist (OT) Description: LTG: Patient will perform tub/shower transfers with assist, with/without cues using equipment (OT) Flowsheets (Taken 09/25/2022 1326) LTG: Pt will perform tub/shower stall transfers with assistance level of: Supervision/Verbal cueing LTG: Pt will perform tub/shower transfers from: Tub/shower combination   Problem: RH Attention Goal: LTG Patient will demonstrate this level of attention during functional activites (OT) Description: LTG:  Patient will demonstrate this level of attention during functional activites  (OT) Flowsheets (Taken 09/25/2022 1326) Patient will demonstrate this level of attention during functional activites: Selective Patient will demonstrate above attention level in the following environment: Home LTG: Patient will demonstrate this level of attention during functional activites (OT): Minimal Assistance - Patient > 75%   Problem: RH Awareness Goal: LTG: Patient will demonstrate awareness during functional activites type of (OT) Description: LTG: Patient will demonstrate awareness during functional activites type of (OT) Flowsheets (Taken 09/25/2022 1326) Patient will demonstrate awareness during functional activites type of: Emergent LTG: Patient will demonstrate awareness during functional activites type of (OT): Minimal Assistance - Patient > 75%

## 2022-09-25 NOTE — Progress Notes (Signed)
Initial Nutrition Assessment  DOCUMENTATION CODES:   Severe malnutrition in context of social or environmental circumstances  INTERVENTION:   -72 hour Calorie Count ordered per MD   -Advised RN/NT to document in flowsheets   - Please document % intake of all foods, drinks, and nutrition supplements patient consumes on meal tickets and place in envelope on patient's door.   - If patient skips/refuses meals please document 0% for that meal.  -Magic cup BID with meals, each supplement provides 290 kcal and 9 grams of protein   Recommend repletion of the following vitamins as labs returned low: Vitamin A, Zinc and Vitamin B6. -Recommend 100,000 units Vitamin A x 3 days followed by 50,000 units x 14 days. -Recommend 100 mg Vitamin B6 daily x 30 days -Recommend 220 mg Zinc sulfate daily x 14 days -Reached out to MD regarding repletion  -D/c Nepro via tube, not warranted, appropriate for those receiving hemodialysis. Monitor K levels. Consider potassium lowering medication if continues to remain high.   -D/c Vital 1.5 and Prosource TF 20  -Switch to Vital AF 1.2 @ 50 ml/hr -Provides 1440 kcals, 90g protein and 973 ml H2O  -1 packet Juven BID via tube, each packet provides 95 calories, 2.5 grams of protein (collagen), and 9.8 grams of carbohydrate (3 grams sugar); also contains 7 grams of L-arginine and L-glutamine, 300 mg vitamin C, 15 mg vitamin E, 1.2 mcg vitamin B-12, 9.5 mg zinc, 200 mg calcium, and 1.5 g  Calcium Beta-hydroxy-Beta-methylbutyrate to support wound healing   -Multivitamin with minerals daily   NUTRITION DIAGNOSIS:   Severe Malnutrition related to social / environmental circumstances as evidenced by severe fat depletion, severe muscle depletion.  GOAL:   Patient will meet greater than or equal to 90% of their needs  MONITOR:   PO intake, Supplement acceptance, Labs, Weight trends, I & O's, TF tolerance  REASON FOR ASSESSMENT:   Consult Calorie  Count  ASSESSMENT:   Pt with PMH of alcohol abuse, CHF with ICH, and COPD admitted from home after witnessed seizure. Admitted to [facility] tx to [facility] for LTM. Discharged 9/12 to CIR.  Patient followed by dietitians in previous admission.  9/12: discharged from CIR, Cortrak in place  Patient continues to receive Vital 1.5 @ 50 ml/hr which is what he was tolerating during previous admission. Will switch to Vital AF 1.2 @ 50 ml/hr, decreases some calories but still meets protein needs while trying to transition to oral diet. On regular diet. 72 hour Calorie Count order to start this morning. Messaged RN regarding calorie count, agreed to document intakes in flowsheets. So far pt has consumed 10% of breakfast, really not eating much.   Multiple vitamin labs returned since being ordered on 9/4. Vitamin A, Zinc and Vitamin B-6 labs were all low. This was relayed to MD via secure chat. Recommendations for repletion above.  Last weight recorded: 126 lbs. Weighed 132 lbs on 9/3. Continue to monitor weights.  Medications: Vitamin C, Vitamin B-12  Labs reviewed: CBGs: 102-132 No updated labs this morning.  NUTRITION - FOCUSED PHYSICAL EXAM:  NFPE performed 9/4, severe muscle and fat depletions likely still present. This RD working remotely. NFPE can be repeated if warranted at follow-up.   Diet Order:   Diet Order             Diet regular Fluid consistency: Thin  Diet effective 1000                   EDUCATION  NEEDS:   No education needs have been identified at this time  Skin:  Skin Integrity Issues:: Stage I, DTI DTI: sacrum Stage I: vertebral column  Last BM:  9/13 -type 7  Height:   Ht Readings from Last 1 Encounters:  09/15/22 5\' 10"  (1.778 m)    Weight:   Wt Readings from Last 1 Encounters:  09/24/22 57.4 kg    BMI:  There is no height or weight on file to calculate BMI.  Estimated Nutritional Needs:   Kcal:  1800-2000  Protein:  90-110g  Fluid:   1.8L/day  Tilda Franco, MS, RD, LDN Inpatient Clinical Dietitian Contact information available via Amion

## 2022-09-25 NOTE — Plan of Care (Signed)
  Problem: RH Swallowing Goal: LTG Patient will consume least restrictive diet using compensatory strategies with assistance (SLP) Description: LTG:  Patient will consume least restrictive diet using compensatory strategies with assistance (SLP) Flowsheets (Taken 09/25/2022 1246) LTG: Pt Patient will consume least restrictive diet using compensatory strategies with assistance of (SLP): Modified Independent   Problem: RH Cognition - SLP Goal: RH LTG Patient will demonstrate orientation with cues Description:  LTG:  Patient will demonstrate orientation to person/place/time/situation with cues (SLP)   Flowsheets (Taken 09/25/2022 1246) LTG Patient will demonstrate orientation to:  Person  Place  Time  Situation LTG: Patient will demonstrate orientation using cueing (SLP): Supervision   Problem: RH Problem Solving Goal: LTG Patient will demonstrate problem solving for (SLP) Description: LTG:  Patient will demonstrate problem solving for basic/complex daily situations with cues  (SLP) Flowsheets (Taken 09/25/2022 1246) LTG: Patient will demonstrate problem solving for (SLP): (mildy complex) Other (comment) LTG Patient will demonstrate problem solving for: Supervision   Problem: RH Memory Goal: LTG Patient will use memory compensatory aids to (SLP) Description: LTG:  Patient will use memory compensatory aids to recall biographical/new, daily complex information with cues (SLP) Flowsheets (Taken 09/25/2022 1246) LTG: Patient will use memory compensatory aids to (SLP): Supervision   Problem: RH Awareness Goal: LTG: Patient will demonstrate awareness during functional activites type of (SLP) Description: LTG: Patient will demonstrate awareness during functional activites type of (SLP) Flowsheets (Taken 09/25/2022 1246) Patient will demonstrate during cognitive/linguistic activities awareness type of: Emergent LTG: Patient will demonstrate awareness during cognitive/linguistic activities with  assistance of (SLP): Supervision

## 2022-09-25 NOTE — Progress Notes (Deleted)
Inpatient Rehabilitation Center Individual Statement of Services  Patient Name:  Derek Oconnell  Date:  09/25/2022  Welcome to the Inpatient Rehabilitation Center.  Our goal is to provide you with an individualized program based on your diagnosis and situation, designed to meet your specific needs.  With this comprehensive rehabilitation program, you will be expected to participate in at least 3 hours of rehabilitation therapies Monday-Friday, with modified therapy programming on the weekends.  Your rehabilitation program will include the following services:  Physical Therapy (PT), Occupational Therapy (OT), Speech Therapy (ST), 24 hour per day rehabilitation nursing, Therapeutic Recreaction (TR), Neuropsychology, Care Coordinator, Rehabilitation Medicine, Nutrition Services, Pharmacy Services, and Other  Weekly team conferences will be held on Wednesdays to discuss your progress.  Your Inpatient Rehabilitation Care Coordinator will talk with you frequently to get your input and to update you on team discussions.  Team conferences with you and your family in attendance may also be held.  Expected length of stay: 7-10 Days  Overall anticipated outcome:  Supervision  Depending on your progress and recovery, your program may change. Your Inpatient Rehabilitation Care Coordinator will coordinate services and will keep you informed of any changes. Your Inpatient Rehabilitation Care Coordinator's name and contact numbers are listed  below.  The following services may also be recommended but are not provided by the Inpatient Rehabilitation Center:   Home Health Rehabiltiation Services Outpatient Rehabilitation Services    Arrangements will be made to provide these services after discharge if needed.  Arrangements include referral to agencies that provide these services.  Your insurance has been verified to be:  Medicare A & B Your primary doctor is:  Modena Slater, MD  Pertinent information will be  shared with your doctor and your insurance company.  Inpatient Rehabilitation Care Coordinator:  Lavera Guise, [place] [phone] or [phone]  Information discussed with and copy given to patient by: Andria Rhein, 09/25/2022, 8:29 AM

## 2022-09-25 NOTE — Progress Notes (Signed)
Inpatient Rehabilitation Care Coordinator Assessment and Plan Patient Details  Name: Derek Oconnell MRN: [id number] Date of Birth: [birth year]  Today's Date: 09/25/2022  Hospital Problems: Principal Problem:   CVA (cerebral vascular accident) [facility])  Past Medical History:  Past Medical History:  Diagnosis Date   Myocardial infarction (HCC) 1989   Past Surgical History:  Past Surgical History:  Procedure Laterality Date   FEMORAL-POPLITEAL BYPASS GRAFT Bilateral 2021   GALLBLADDER SURGERY  2019   VASECTOMY  1992   Social History:  reports that he has been smoking cigarettes. He uses smokeless tobacco. He reports current alcohol use. No history on file for drug use.  Family / Support Systems Marital Status: Married How Long?: n/a Patient Roles: Spouse Spouse/Significant Other: Technical brewer Children: N/A Other Supports: n/a Anticipated Caregiver: Spouse, Derek Oconnell (SW has not confirmed) Ability/Limitations of Caregiver: none Caregiver Availability: 24/7 Family Dynamics: support from spouse who [facility]  Social History Preferred language: English Religion:  Cultural Background: indpendent going out about once a week Education: HS Health Literacy - How often do you need to have someone help you when you read instructions, pamphlets, or other written material from your doctor or pharmacy?: Never Legal History/Current Legal Issues: n/a Guardian/Conservator: n/a   Abuse/Neglect Abuse/Neglect Assessment Can Be Completed: Yes Physical Abuse: Denies Verbal Abuse: Denies Sexual Abuse: Denies Exploitation of patient/patient's resources: Denies Self-Neglect: Denies  Patient response to: Social Isolation - How often do you feel lonely or isolated from those around you?: Never  Emotional Status Pt's affect, behavior and adjustment status: Pleasant Recent Psychosocial Issues: Coping Psychiatric History: N/A Substance Abuse History: N/A  Patient / Family Perceptions, Expectations  & Goals Pt/Family understanding of illness & functional limitations: Sw will continue to FU with spouse. Premorbid pt/family roles/activities: Independent overalla bedsides assistance with cogntive tasks Anticipated changes in roles/activities/participation: PAtient anticpating supervision goal and continue assist with cogntive tasks. Pt/family expectations/goals: supervision  [employer]: None Premorbid Home Care/DME Agencies: None Transportation available at discharge: spouse Is the patient able to respond to transportation needs?: Yes In the past 12 months, has lack of transportation kept you from medical appointments or from getting medications?: No In the past 12 months, has lack of transportation kept you from meetings, work, or from getting things needed for daily living?: No Resource referrals recommended: Neuropsychology  Discharge Planning Living Arrangements: Spouse/significant other Support Systems: Spouse/significant other Type of Residence: Private residence Marine scientist level home, anticipated main level care; 4 steps to enter) Insurance Resources: Electrical engineer Resources: [employer] Screen Referred: No Living Expenses: Lives with family Money Management: Patient, Spouse Does the patient have any problems obtaining your medications?: No Home Management: Assist from spouse prior Patient/Family Preliminary Plans: Spouse will continue Care Coordinator Barriers to Discharge: Lack of/limited family support, Decreased caregiver support Care Coordinator Anticipated Follow Up Needs: HH/OP Expected length of stay: 7-10 Days  Clinical Impression SW met with patient to introduced self, explain role and address questions and concerns. Patient plans to discharge home with spouse to provide 24/7 supervision. Spouse WFH. Multilevel home with patient planning to set up on the main level. 4 steps to enter the home. SW left detailed.  VM with pt's spouse,  Derek Oconnell. No additional questions or concerns.  Andria Rhein 09/25/2022, 12:28 PM

## 2022-09-25 NOTE — Evaluation (Signed)
Physical Therapy Assessment and Plan  Patient Details  Name: Derek Oconnell MRN: [id number] Date of Birth: 1955/03/16  PT Diagnosis: Abnormality of gait, Difficulty walking, Impaired cognition, and Muscle weakness Rehab Potential: Good ELOS: 7-9 days   Today's Date: 09/25/2022 PT Individual Time: 1300-1410 PT Individual Time Calculation (min): 70 min    Hospital Problem: Principal Problem:   CVA (cerebral vascular accident) [facility])   Past Medical History:  Past Medical History:  Diagnosis Date   Myocardial infarction (HCC) 1989   Past Surgical History:  Past Surgical History:  Procedure Laterality Date   FEMORAL-POPLITEAL BYPASS GRAFT Bilateral 2021   GALLBLADDER SURGERY  2019   VASECTOMY  1992    Assessment & Plan Clinical Impression: Patient is a 67 year old male with history of CAD, PAD s/p stents, ETOH abuse (drinks 1.5 L bottle of bourbon/4-5 days) who was admitted on 09/15/22 via [facility] with MS changes and seizure type activity. . Family reported  3 day history of illness with decrease intake, diarrhea and decrease in ETOH use. He was felt to have status epilepticus due to ETOH withdrawal. He had seizure activity in ED, was loaded with Keppra, intubated and transferred to [facility] for management.  He was placed on pressors for hypotension, IV antibiotics added due to concerns of sepsis with elevated WBC and metabolic acidosis. He was also noted to have significant hypocalcemia Ca-5.1, hypomagnesemia Mg-1.2 and hypokalemia K-2.7 and low Mg 1.2 which was supplemented.  CT head negative for acute changes. LTM-EEG showed severe diffuse encephalopathy. He was weaned off versed and anti-seizure meds held to monitor for ictal-interictal abnormality.  He was weaned off vent and no seizure type events noted. Anemia and thrombocytopenia felt to be ETOH related. Vitamin B12 low normal and extremely low thiamine level @ 1.1 supplemented parentally X 5 days and transitioned to oral supplement.    MRI  brain performed once stable and showed right parietal lobe infarcts. CTA head/neck was negative for LVO and showed incidental finding of R>L pleural effusions with RUL ground glass opacity concerning for infectious inflammatory process.  He was started on Unasyn for aspiration PNA on 09/07 due to low grade fevers with rise in LA. He was treated with CIWA protocol for ETOH withdrawal and weaned off pressors and hypotension felt to be at baseline with SBP 90-100 range.  2D echo showed EF 60-65% with G2DD. He was on DAPT PTA and changed to ASA daily due to low platelets. Dr. Pearlean Brownie felt that stroke likely cardioembolic  but not the best candidate due to heavy ETOH use and fall risk. This was discussed with wife and PCCM and 30 day cardiac monitor recommended at discharge. He was kept NPO with tube feeds due to ongoing bouts of lethargy with dysphonia and signs of dysphagia. MBS 09/10 with prolonged mastication but without signs of aspiration and diet advanced to regular today with tube feeds at nights for severe malnutrition.  He is noted to be severely deconditioned with HR upto 140s with minimal activity. He is limited by weakness, cognitive deficits with delay in processing and requires min assist for pregait activity. CIR recommended due to functional decline.   Patient transferred to CIR on 09/24/2022 .   Patient currently requires min with mobility secondary to muscle weakness and muscle joint tightness, decreased cardiorespiratoy endurance, decreased motor planning, decreased initiation, decreased attention, decreased awareness, decreased problem solving, decreased safety awareness, decreased memory, and delayed processing, and decreased sitting balance, decreased standing balance, and decreased balance strategies.  Prior to hospitalization, patient was independent  with mobility and lived with Spouse in a House home.  Home access is 4 frontStairs to enter.  Patient will benefit from skilled PT intervention  to maximize safe functional mobility, minimize fall risk, and decrease caregiver burden for planned discharge home with 24 hour supervision.  Anticipate patient will benefit from follow up HH at discharge.  PT - End of Session Activity Tolerance: Tolerates < 10 min activity with changes in vital signs Endurance Deficit: Yes Endurance Deficit Description: HR increase with limited activity (dressing) short of breath with ADL PT Assessment Rehab Potential (ACUTE/IP ONLY): Good PT Barriers to Discharge: Decreased caregiver support;Lack of/limited family support;Insurance for SNF coverage;Nutrition means PT Patient demonstrates impairments in the following area(s): Balance;Endurance;Motor;Safety;Skin Integrity PT Transfers Functional Problem(s): Bed Mobility;Bed to Chair;Car PT Locomotion Functional Problem(s): Ambulation;Stairs PT Plan PT Intensity: Minimum of 1-2 x/day ,45 to 90 minutes PT Frequency: 5 out of 7 days PT Duration Estimated Length of Stay: 7-9 days PT Treatment/Interventions: Ambulation/gait training;Discharge planning;Functional mobility training;Psychosocial support;Therapeutic Activities;Therapeutic Exercise;Wheelchair propulsion/positioning;Neuromuscular re-education;Disease management/prevention;Balance/vestibular training;Skin care/wound management;UE/LE Strength taining/ROM;Splinting/orthotics;Pain management;DME/adaptive equipment instruction;Cognitive remediation/compensation;Community reintegration;Functional electrical stimulation;Patient/family education;Stair training;UE/LE Coordination activities PT Transfers Anticipated Outcome(s): supervision PT Locomotion Anticipated Outcome(s): supervision PT Recommendation Recommendations for Other Services: Neuropsych consult Follow Up Recommendations: Home health PT;24 hour supervision/assistance Patient destination: Home Equipment Recommended: To be determined   PT  Evaluation Precautions/Restrictions Precautions Precautions: Fall Precaution Comments: cortrak, ETOH abuse, watch HR Restrictions Weight Bearing Restrictions: No Pain Pain Assessment Pain Score: 0-No pain Pain Interference Pain Interference Pain Effect on Sleep: 0. Does not apply - I have not had any pain or hurting in the past 5 days Pain Interference with Therapy Activities: 0. Does not apply - I have not received rehabilitationtherapy in the past 5 days Pain Interference with Day-to-Day Activities: 1. Rarely or not at all Home Living/Prior Functioning Home Living Living Arrangements: Spouse/significant other Available Help at Discharge: Family;Available 24 hours/day Type of Home: House Home Access: Stairs to enter [employer] of Steps: 4 front Entrance Stairs-Rails: Right Home Layout: Multi-level;Full bath on main level Alternate Level Stairs-Number of Steps: flight Bathroom Shower/Tub: Tub/shower unit;Curtain Bathroom Toilet: Standard Bathroom Accessibility: Yes Additional Comments: "I was given a walker for medical reasons."  'I don't usually use the walker."  Lives With: Spouse Prior Function Level of Independence: Independent with basic ADLs  Able to Take Stairs?: Yes Driving: Yes Vocation: Retired Vision/Perception  Vision - History Ability to See in Adequate Light: 0 Adequate Perception Perception: Within Functional Limits Praxis Praxis: WFL  Cognition Overall Cognitive Status: Impaired/Different from baseline Arousal/Alertness: Awake/alert Orientation Level: Oriented X4 Attention: Focused;Sustained Focused Attention: Appears intact Sustained Attention: Impaired Sustained Attention Impairment: Functional basic Memory: Impaired Memory Impairment: Decreased short term memory;Retrieval deficit;Storage deficit Decreased Short Term Memory: Functional basic;Functional complex Awareness: Impaired Awareness Impairment: Intellectual impairment Problem  Solving: Impaired Problem Solving Impairment: Functional basic Executive Function: Initiating Initiating: Impaired Initiating Impairment: Functional basic Behaviors: Other (comment) (flat affect) Safety/Judgment: Impaired Sensation Sensation Light Touch: Appears Intact Hot/Cold: Appears Intact Proprioception: Appears Intact Stereognosis: Not tested Coordination Gross Motor Movements are Fluid and Coordinated: No Fine Motor Movements are Fluid and Coordinated: Yes Coordination and Movement Description: General deconditioning Motor  Motor Motor: Other (comment) Motor - Skilled Clinical Observations: Generalized weakness and deconditioning   Trunk/Postural Assessment  Cervical Assessment Cervical Assessment: Within Functional Limits Thoracic Assessment Thoracic Assessment: Within Functional Limits Lumbar Assessment Lumbar Assessment: Within Functional Limits Postural Control Postural Control: Within Functional Limits  Balance Balance Balance Assessed: Yes Static Sitting Balance Static Sitting - Balance Support: Feet supported;No upper extremity supported Static Sitting - Level of Assistance: 7: Independent Dynamic Sitting Balance Dynamic Sitting - Balance Support: During functional activity;Feet supported Dynamic Sitting - Level of Assistance: 5: Stand by assistance Static Standing Balance Static Standing - Balance Support: Right upper extremity supported;Left upper extremity supported;During functional activity Static Standing - Level of Assistance: 4: Min assist Dynamic Standing Balance Dynamic Standing - Balance Support: Right upper extremity supported;Left upper extremity supported;During functional activity Dynamic Standing - Level of Assistance: 4: Min assist Extremity Assessment  RUE Assessment RUE Assessment: Within Functional Limits General Strength Comments: 4-/5 proximally LUE Assessment LUE Assessment: Within Functional Limits General Strength Comments:  4/5 proximally RLE Assessment RLE Assessment: Exceptions to [facility] General Strength Comments: Grossly 4/5 LLE Assessment LLE Assessment: Exceptions to [facility] General Strength Comments: Grossly 4/5  Care Tool Care Tool Bed Mobility Roll left and right activity   Roll left and right assist level: Minimal Assistance - Patient > 75%    Sit to lying activity   Sit to lying assist level: Minimal Assistance - Patient > 75%    Lying to sitting on side of bed activity   Lying to sitting on side of bed assist level: the ability to move from lying on the back to sitting on the side of the bed with no back support.: Minimal Assistance - Patient > 75%     Care Tool Transfers Sit to stand transfer   Sit to stand assist level: Minimal Assistance - Patient > 75%    Chair/bed transfer   Chair/bed transfer assist level: Minimal Assistance - Patient > 75%     Toilet transfer Toilet transfer activity did not occur: Veterinary surgeon transfer assist level: Minimal Assistance - Patient > 75%      Care Tool Locomotion Ambulation   Assist level: Minimal Assistance - Patient > 75% Assistive device: Walker-rolling Max distance: 6ft  Walk 10 feet activity   Assist level: Minimal Assistance - Patient > 75% Assistive device: Walker-rolling   Walk 50 feet with 2 turns activity Walk 50 feet with 2 turns activity did not occur: Safety/medical concerns (fatigue)      Walk 150 feet activity Walk 150 feet activity did not occur: Safety/medical concerns      Walk 10 feet on uneven surfaces activity Walk 10 feet on uneven surfaces activity did not occur: Safety/medical concerns      Stairs   Assist level: Minimal Assistance - Patient > 75% Stairs assistive device: 2 hand rails Max number of stairs: 4  Walk up/down 1 step activity   Walk up/down 1 step (curb) assist level: Minimal Assistance - Patient > 75% Walk up/down 1 step or curb assistive device: 2 hand rails  Walk up/down 4 steps  activity   Walk up/down 4 steps assist level: Minimal Assistance - Patient > 75% Walk up/down 4 steps assistive device: 2 hand rails  Walk up/down 12 steps activity Walk up/down 12 steps activity did not occur: Safety/medical concerns      Pick up small objects from floor Pick up small object from the floor (from standing position) activity did not occur: Safety/medical concerns      Wheelchair Is the patient using a wheelchair?: Yes Type of Wheelchair: Manual   Wheelchair assist level: Maximal Assistance - Patient 25 - 49% Max wheelchair distance: 25ft  Wheel 50 feet with 2 turns activity  Assist Level: Moderate Assistance - Patient 50 - 74%  Wheel 150 feet activity   Assist Level: Total Assistance - Patient < 25%    Refer to Care Plan for Long Term Goals  SHORT TERM GOAL WEEK 1 PT Short Term Goal 1 (Week 1): STG = LTG  Recommendations for other services: Neuropsych  Skilled Therapeutic Intervention Mobility Bed Mobility Bed Mobility: Rolling Right;Rolling Left;Supine to Sit Rolling Right: Minimal Assistance - Patient > 75% Rolling Left: Minimal Assistance - Patient > 75% Supine to Sit: Minimal Assistance - Patient > 75% Transfers Transfers: Sit to Stand;Stand to Sit;Stand Pivot Transfers Sit to Stand: Minimal Assistance - Patient > 75% Stand to Sit: Minimal Assistance - Patient > 75% Stand Pivot Transfers: Minimal Assistance - Patient > 75% Stand Pivot Transfer Details: Tactile cues for initiation;Tactile cues for placement;Verbal cues for technique;Tactile cues for sequencing;Tactile cues for posture;Verbal cues for sequencing;Verbal cues for precautions/safety Transfer (Assistive device): None Locomotion  Gait Ambulation: Yes Gait Assistance: Minimal Assistance - Patient > 75% Gait Distance (Feet): 40 Feet Assistive device: Rolling walker Gait Assistance Details: Verbal cues for gait pattern;Verbal cues for safe use of DME/AE;Verbal cues for  precautions/safety;Tactile cues for posture;Tactile cues for initiation Gait Gait: Yes Gait Pattern: Impaired Gait Pattern: Trunk flexed;Poor foot clearance - left;Poor foot clearance - right;Wide base of support;Step-through pattern;Decreased hip/knee flexion - right;Decreased hip/knee flexion - left Stairs / Additional Locomotion Stairs: Yes Stairs Assistance: Minimal Assistance - Patient > 75% Stair Management Technique: Two rails;Forwards;Step to pattern Number of Stairs: 4 Height of Stairs: 6 Wheelchair Mobility Wheelchair Mobility: No  Skilled Intervention: Pt presents sitting slumped in the recliner, flat affect and agreeable to PT evaluation. Pt oriented and alert, delayed processing present with poor sustained attention. Wife arriving during treatment and able to confirm PLOF and social factors. Pt initiated in functional mobility as outlined above. Pt presents with general weakness and deconditioning, cognitive deficits, and impaired balance. Session concluded with patient sitting with seat belt alarm on in the recliner, wife present, all needs met.   Discharge Criteria: Patient will be discharged from PT if patient refuses treatment 3 consecutive times without medical reason, if treatment goals not met, if there is a change in medical status, if patient makes no progress towards goals or if patient is discharged from hospital.  The above assessment, treatment plan, treatment alternatives and goals were discussed and mutually agreed upon: by patient and by family  [name]  PT, DPT, CSRS  09/25/2022, 2:06 PM

## 2022-09-25 NOTE — Progress Notes (Signed)
Inpatient Rehabilitation Admission Medication Review by a Pharmacist  A complete drug regimen review was completed for this patient to identify any potential clinically significant medication issues.  High Risk Drug Classes Is patient taking? Indication by Medication  Antipsychotic Yes Compazine PRN- n/v   Anticoagulant Yes Lovenox- DVT ppx   Antibiotic Yes Augmentin (EOT 9/14)   Opioid No   Antiplatelet Yes bASA- CVA, CAD    Hypoglycemics/insulin No   Vasoactive Medication No   Chemotherapy No   Other Yes Lipitor- HLD, CVA   Remeron PPI- Reflux  Trazodone- insomnia  Benadryl- itching  Vit C, folic acid, MVI, thiamine, Vit B12, free water- supplement      Type of Medication Issue Identified Description of Issue Recommendation(s)  Drug Interaction(s) (clinically significant)     Duplicate Therapy     Allergy     No Medication Administration End Date     Incorrect Dose     Additional Drug Therapy Needed     Significant med changes from prior encounter (inform family/care partners about these prior to discharge).    Other  PTA: Plavix discontinued d/t thrombocytopenia, bupropion, toprol, lisinopril  Resume bupropion if clinically indicated     Clinically significant medication issues were identified that warrant physician communication and completion of prescribed/recommended actions by midnight of the next day:  Yes  Name of provider notified for urgent issues identified: Augmentin planned to continue through 9/14 to complete 7d antibiotic course for aspiration PNA with TA growing Staph. Lugdunensis - OK to resume after discussion with MD   Provider Method of Notification: Secure Chat     Pharmacist comments:   Time spent performing this drug regimen review (minutes):  20   Jani Gravel, PharmD Clinical Pharmacist  09/25/2022 7:28 AM

## 2022-09-25 NOTE — Evaluation (Signed)
Occupational Therapy Assessment and Plan  Patient Details  Name: Derek Oconnell MRN: [id number] Date of Birth: 1955/04/23  OT Diagnosis: altered mental status, cognitive deficits, muscular wasting and disuse atrophy, and muscle weakness (generalized) Rehab Potential: Rehab Potential (ACUTE ONLY): Good ELOS: 7 days   Today's Date: 09/25/2022 OT Individual Time: 8413-2440 OT Individual Time Calculation (min): 73 min     Hospital Problem: Principal Problem:   CVA (cerebral vascular accident) [facility])   Past Medical History:  Past Medical History:  Diagnosis Date   Myocardial infarction (HCC) 1989   Past Surgical History:  Past Surgical History:  Procedure Laterality Date   FEMORAL-POPLITEAL BYPASS GRAFT Bilateral 2021   GALLBLADDER SURGERY  2019   VASECTOMY  1992    Assessment & Plan Clinical Impression: Derek Oconnell is a 67 year old male with history of CAD, PAD s/p stents, ETOH abuse who was admitted on 09/15/22 via [facility] with MS changes and seizure type activity. . Family reported  3 day history of illness with decrease intake, diarrhea and decrease in ETOH use. He was felt to have status epilepticus due to ETOH withdrawal. He had seizure activity in ED, was loaded with Keppra, intubated and transferred to [facility] for management.  He was placed on pressors for hypotension, IV antibiotics added due to concerns of sepsis with elevated WBC and metabolic acidosis. He was also noted to have significant hypocalcemia Ca-5.1, hypomagnesemia Mg-1.2 and hypokalemia K-2.7 and low Mg 1.2 which was supplemented.  CT head negative for acute changes. LTM-EEG showed severe diffuse encephalopathy. He was weaned off versed and anti-seizure meds held to monitor for ictal-interictal abnormality.  He was weaned off vent and no seizure type events noted.    MRI brain performed once stable and showed right parietal lobe infarcts. CTA head/neck was negative for LVO and showed incidental finding of R>L pleural  effusions with RUL ground glass opacity concerning for infectious inflammatory process.  He was started on Unasyn for aspiration PNA on 09/07 due to low grade fevers with rise in LA. He was treated with CIWA protocol for ETOH withdrawal and weaned off pressors and hypotension felt to be at baseline with SBP 90-100 range.  2D echo showed EF 60-65% with G2DD. He is noted to be severely deconditioned with HR upto 140s with minimal activity. He is limited by weakness, cognitive deficits with delay in processing and requires min assist for pregait activity. CIR recommended due to functional decline.   Patient transferred to CIR on 09/24/2022 .    Patient currently requires mod with basic self-care skills secondary to muscle weakness, decreased cardiorespiratoy endurance, and decreased standing balance and decreased balance strategies.  Prior to hospitalization, patient could complete BADL with independent .  Patient will benefit from skilled intervention to decrease level of assist with basic self-care skills and increase independence with basic self-care skills prior to discharge home with care partner.  Anticipate patient will require intermittent supervision and follow up home health.  OT - End of Session Activity Tolerance: Decreased this session Endurance Deficit: Yes Endurance Deficit Description: HR increase with limited activity (dressing) short of breath with ADL OT Assessment Rehab Potential (ACUTE ONLY): Good OT Patient demonstrates impairments in the following area(s): Balance;Behavior;Cognition;Endurance;Safety OT Basic ADL's Functional Problem(s): Bathing;Dressing;Toileting OT Transfers Functional Problem(s): Toilet;Tub/Shower OT Additional Impairment(s): None OT Plan OT Intensity: Minimum of 1-2 x/day, 45 to 90 minutes OT Frequency: 5 out of 7 days OT Duration/Estimated Length of Stay: 7 days OT Treatment/Interventions: Balance/vestibular training;Disease  mangement/prevention;Patient/family education;Self  Care/advanced ADL retraining;Therapeutic Exercise;UE/LE Coordination activities;UE/LE Strength taining/ROM;Therapeutic Activities;Skin care/wound managment;Psychosocial support;Functional mobility training;DME/adaptive equipment instruction;Discharge planning;Cognitive remediation/compensation OT Self Feeding Anticipated Outcome(s): Independent (hand to mouth) OT Basic Self-Care Anticipated Outcome(s): Supervision/ Set up OT Toileting Anticipated Outcome(s): Supervision OT Bathroom Transfers Anticipated Outcome(s): Supervision OT Recommendation Recommendations for Other Services: Neuropsych consult Patient destination: Home Follow Up Recommendations: Home health OT Equipment Recommended: To be determined   OT Evaluation Precautions/Restrictions  Precautions Precautions: Fall Precaution Comments: cortrak, watch HR Restrictions Weight Bearing Restrictions: No   Vital Signs Therapy Vitals Pulse Rate: (!) 120 BP: 105/69 Patient Position (if appropriate): Sitting Oxygen Therapy SpO2: 100 % O2 Device: Room Air Pain Pain Assessment Pain Score: 0-No pain Home Living/Prior Functioning Home Living Family/patient expects to be discharged to:: Private residence Living Arrangements: Spouse/significant other Available Help at Discharge: Family, Available 24 hours/day Type of Home: House Home Access: Stairs to enter [employer] of Steps: 4 front Entrance Stairs-Rails: Right Home Layout: Multi-level, Full bath on main level Alternate Level Stairs-Number of Steps: flight Bathroom Shower/Tub: Tub/shower unit, Engineer, building services: Standard Bathroom Accessibility: Yes Additional Comments: "I was given a walker for medical reasons."  'I don't usually use the walker."  Lives With: Spouse IADL History Homemaking Responsibilities: Yes Meal Prep Responsibility: Secondary Laundry Responsibility: No Cleaning Responsibility:  No Current License: Yes Mode of Transportation: Car Occupation: Retired Advertising account planner: at one time I enjoyed golf (not anymore) Prior Function Level of Independence: Independent with basic ADLs Driving: Yes Vocation: Retired Administrator, sports Baseline Vision/History: 1 Wears glasses Ability to See in Adequate Light: 0 Adequate Patient Visual Report: No change from baseline Vision Assessment?: No apparent visual deficits Perception  Perception: Within Functional Limits Praxis Praxis: WFL Cognition Cognition Overall Cognitive Status: Impaired/Different from baseline Arousal/Alertness: Awake/alert Orientation Level: Person;Place Memory: Impaired Memory Impairment: Decreased short term memory;Retrieval deficit;Storage deficit Decreased Short Term Memory: Functional basic;Functional complex Attention: Sustained Sustained Attention: Impaired Sustained Attention Impairment: Functional basic Awareness: Impaired Awareness Impairment: Intellectual impairment ("They say I have had a stroke.") Problem Solving: Impaired Problem Solving Impairment: Functional basic Executive Function: Initiating Initiating: Impaired Initiating Impairment: Functional basic Behaviors: Other (comment) (flat affect) Safety/Judgment: Impaired Brief Interview for Mental Status (BIMS) Repetition of Three Words (First Attempt): 2 Temporal Orientation: Year: Correct Temporal Orientation: Month: Accurate within 5 days Temporal Orientation: Day: Incorrect Recall: "Sock": Yes, after cueing ("something to wear") Recall: "Blue": No, could not recall Recall: "Bed": Yes, after cueing ("a piece of furniture") BIMS Summary Score: 9 Sensation Sensation Light Touch: Appears Intact Hot/Cold: Appears Intact Proprioception: Appears Intact Stereognosis: Not tested Coordination Gross Motor Movements are Fluid and Coordinated: Yes Fine Motor Movements are Fluid and Coordinated: Yes Motor  Motor Motor - Skilled Clinical  Observations: Mild proximal wekaness RUE, General deconditioning  Trunk/Postural Assessment  Cervical Assessment Cervical Assessment: Within Functional Limits Thoracic Assessment Thoracic Assessment: Within Functional Limits Lumbar Assessment Lumbar Assessment: Within Functional Limits Postural Control Postural Control: Within Functional Limits  Balance Balance Balance Assessed: Yes Static Sitting Balance Static Sitting - Balance Support: Feet supported;No upper extremity supported Static Sitting - Level of Assistance: 7: Independent Dynamic Sitting Balance Dynamic Sitting - Balance Support: During functional activity;Feet supported Dynamic Sitting - Level of Assistance: 5: Stand by assistance Static Standing Balance Static Standing - Balance Support: Right upper extremity supported;Left upper extremity supported;During functional activity Static Standing - Level of Assistance: 4: Min assist Dynamic Standing Balance Dynamic Standing - Balance Support: Right upper extremity supported;Left upper extremity supported;During functional activity Dynamic Standing -  Level of Assistance: 4: Min assist Extremity/Trunk Assessment RUE Assessment RUE Assessment: Within Functional Limits General Strength Comments: 4-/5 proximally LUE Assessment LUE Assessment: Within Functional Limits General Strength Comments: 4/5 proximally  Care Tool Care Tool Self Care Eating Eating activity did not occur: Safety/medical concerns      Oral Care    Oral Care Assist Level: Supervision/Verbal cueing    Bathing   Body parts bathed by patient: Right arm;Left arm;Chest;Abdomen;Front perineal area;Right upper leg;Left upper leg;Face Body parts bathed by helper: Buttocks;Left lower leg;Right lower leg   Assist Level: Moderate Assistance - Patient 50 - 74%    Upper Body Dressing(including orthotics)   What is the patient wearing?: Pull over shirt   Assist Level: Minimal Assistance - Patient > 75%     Lower Body Dressing (excluding footwear)   What is the patient wearing?: Incontinence brief;Pants Assist for lower body dressing: Moderate Assistance - Patient 50 - 74%    Putting on/Taking off footwear   What is the patient wearing?: Socks Assist for footwear: Maximal Assistance - Patient 25 - 49%       Care Tool Toileting Toileting activity   Assist for toileting: Maximal Assistance - Patient 25 - 49%     Care Tool Bed Mobility Roll left and right activity   Roll left and right assist level: Minimal Assistance - Patient > 75%    Sit to lying activity   Sit to lying assist level: Minimal Assistance - Patient > 75%    Lying to sitting on side of bed activity   Lying to sitting on side of bed assist level: the ability to move from lying on the back to sitting on the side of the bed with no back support.: Minimal Assistance - Patient > 75%     Care Tool Transfers Sit to stand transfer   Sit to stand assist level: Minimal Assistance - Patient > 75%    Chair/bed transfer   Chair/bed transfer assist level: Minimal Assistance - Patient > 75%     Toilet transfer Toilet transfer activity did not occur: Refused       Care Tool Cognition  Expression of Ideas and Wants Expression of Ideas and Wants: 3. Some difficulty - exhibits some difficulty with expressing needs and ideas (e.g, some words or finishing thoughts) or speech is not clear  Understanding Verbal and Non-Verbal Content Understanding Verbal and Non-Verbal Content: 3. Usually understands - understands most conversations, but misses some part/intent of message. Requires cues at times to understand   Memory/Recall Ability Memory/Recall Ability : That he or she is in a hospital/hospital unit   Refer to Care Plan for Long Term Goals  SHORT TERM GOAL WEEK 1 OT Short Term Goal 1 (Week 1): STG=LTG due to LOS  Recommendations for other services: Neuropsych   Skilled Therapeutic Intervention: Patient supine in bed upon OT  arrival.  Patient awake, slightly lethargic with flat affect, poor historian - but indicates - "they say I had a stroke."  Patient assisted to edge of bed then wheelchair for simple bathing and dressing.  Tube feeding currently running.  Patient unaware of incontinence and needing step by step cueing for bathing and dressing tasks.  Left up in recliner with safety belt in place and engaged and call bell/ personal items in reach.  ADL ADL Eating: Unable to assess Grooming: Minimal assistance Where Assessed-Grooming: Sitting at sink Upper Body Bathing: Minimal assistance Where Assessed-Upper Body Bathing: Sitting at sink Lower Body Bathing: Moderate assistance  Where Assessed-Lower Body Bathing: Sitting at sink;Standing at sink Upper Body Dressing: Minimal assistance Where Assessed-Upper Body Dressing: Sitting at sink Lower Body Dressing: Moderate assistance Where Assessed-Lower Body Dressing: Sitting at sink;Standing at sink Toileting: Maximal assistance Where Assessed-Toileting: Hydrologist: Minimal assistance Statistician Method: Stand pivot Tub/Shower Transfer: Unable to assess Tub/Shower Transfer Method: Unable to assess Film/video editor: Unable to assess ADL Comments: Poor historian related to prior level of activity Mobility  Bed Mobility Bed Mobility: Rolling Right;Rolling Left;Supine to Sit Rolling Right: Minimal Assistance - Patient > 75% Rolling Left: Minimal Assistance - Patient > 75% Supine to Sit: Minimal Assistance - Patient > 75% Transfers Sit to Stand: Minimal Assistance - Patient > 75% Stand to Sit: Minimal Assistance - Patient > 75%   Discharge Criteria: Patient will be discharged from OT if patient refuses treatment 3 consecutive times without medical reason, if treatment goals not met, if there is a change in medical status, if patient makes no progress towards goals or if patient is discharged from hospital.  The above assessment,  treatment plan, treatment alternatives and goals were discussed and mutually agreed upon: by patient  [name] 09/25/2022, 1:17 PM

## 2022-09-25 NOTE — Evaluation (Addendum)
Speech Language Pathology Assessment and Plan  Patient Details  Name: Derek Oconnell MRN: [id number] Date of Birth: January 18, 1955  SLP Diagnosis: Cognitive Impairments;Dysphagia  Rehab Potential: Excellent ELOS: 7-10 days    Today's Date: 09/25/2022 SLP Individual Time: 1100-1159 SLP Individual Time Calculation (min): 59 min   Hospital Problem: Principal Problem:   CVA (cerebral vascular accident) [facility])  Past Medical History:  Past Medical History:  Diagnosis Date   Myocardial infarction (HCC) 1989   Past Surgical History:  Past Surgical History:  Procedure Laterality Date   FEMORAL-POPLITEAL BYPASS GRAFT Bilateral 2021   GALLBLADDER SURGERY  2019   VASECTOMY  1992    Assessment / Plan / Recommendation Clinical Impression HPI: Pt is a 67 year old male  with h/o HTN, CAD, pacemaker placement, COPD, and ETOH use d/o who presented to [facility] on 09/15/22 with AMS and seizure-like activity.  He was intubated and transferred to [facility] for LTM on 09/15/22.  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 Pt also with alcohol withdrawal symptoms, placed on CIWA protocol on admission.  He required pressors and developed low-grade fever with GGO in the RUL and CTA and so was started on Unasyn for aspiration PNA.  He has since weaned off norepinephrine and was transferred out of ICU on 9/9. Pt. With coretrak in place.   Clinical Impression:  Bedside Swallow Evaluation: Patient was assessed via a bedside swallow evaluation to assess s/sx of oropharyngeal dysphagia. Oral mechanism exam WFL. PO's administered included thin liquids via cup and straw, puree and solids. Patient with no overt s/sx of aspiration throughout. Prolonged mastications times during solids, however patient is edentulous. Patient reports "sticking" in throat, however MBS reports only trace pharyngeal residuals. Recommend regular/thin liquid diet with intermittent supervision to cue for  standardized precautions. Medications may be administered whole, however large pills cut in half per patient request.  Cognition: Patient was evaluated via the Cognistat to assess cognitive linguistic functioning. Patient scored WFL on attention, registration, comprehension, repetition, and naming subtests. Mild impairments present in orientation as patient recalled incorrect date and age. Mild impairments present in memory task as patient unable to recall vocabulary words after a time delay, requiring min-mod cues. Mild-moderate problem solving deficits exhibited through difficulties during calculations, similarities, and judgement subtests. During evaluation, this SLP observed deficits in awareness as patient was often unaware of mistakes. Communication: Patients expressive and receptive communication WFL. Noted delayed initiation however likely due to reduced processing speed (cognitive deficit).  Pt would benefit from skilled ST services to maximize cognition in order to maximize functional independence at d/c. Anticipate patient will require 24 hour supervision at d/c and f/u SLP services.    Skilled Therapeutic Interventions          Patient evaluated using a standardized cognitive linguistic assessment and bedside swallow evaluation to assess current cognitive, communicative and swallowing function. See above for details.   SLP Assessment  Patient will need skilled Speech Lanaguage Pathology Services during CIR admission    Recommendations  SLP Diet Recommendations: Age appropriate regular solids;Thin Liquid Administration via: Cup;Straw Medication Administration: Whole meds with liquid (large pills cut in half) Supervision: Patient able to self feed;Intermittent supervision to cue for compensatory strategies Compensations: Minimize environmental distractions;Slow rate;Small sips/bites Postural Changes and/or Swallow Maneuvers: Seated upright 90 degrees Oral Care Recommendations: Oral care  BID Patient destination: Home Follow up Recommendations: Home Health SLP Equipment Recommended: None recommended by SLP    SLP  Frequency 3 to 5 out of 7 days   SLP Duration  SLP Intensity  SLP Treatment/Interventions 7-10 days  Minumum of 1-2 x/day, 30 to 90 minutes  Cognitive remediation/compensation;Dysphagia/aspiration precaution training;Internal/external aids;Cueing hierarchy;Environmental controls;Therapeutic Activities;Functional tasks;Patient/family education;Therapeutic Exercise    Pain Pain Assessment Pain Score: 0-No pain  Prior Functioning Type of Home: House  Lives With: Spouse Available Help at Discharge: Family;Available 24 hours/day  SLP Evaluation Cognition Overall Cognitive Status: Impaired/Different from baseline Arousal/Alertness: Awake/alert Orientation Level: Oriented X4 Memory: Impaired Memory Impairment: Decreased short term memory;Retrieval deficit;Storage deficit Decreased Short Term Memory: Verbal basic;Functional basic Awareness: Impaired Awareness Impairment: Emergent impairment Problem Solving: Impaired Problem Solving Impairment: Verbal basic;Functional basic Safety/Judgment: Impaired  Comprehension Auditory Comprehension Overall Auditory Comprehension: Appears within functional limits for tasks assessed Commands: Within Functional Limits Expression Expression Primary Mode of Expression: Verbal Verbal Expression Overall Verbal Expression: Appears within functional limits for tasks assessed Written Expression Dominant Hand: Right Oral Motor Oral Motor/Sensory Function Overall Oral Motor/Sensory Function: Within functional limits Motor Speech Overall Motor Speech: Appears within functional limits for tasks assessed  Care Tool Care Tool Cognition Ability to hear (with hearing aid or hearing appliances if normally used Ability to hear (with hearing aid or hearing appliances if normally used): 1. Minimal difficulty - difficulty in  some environments (e.g. when person speaks softly or setting is noisy)   Expression of Ideas and Wants Expression of Ideas and Wants: 3. Some difficulty - exhibits some difficulty with expressing needs and ideas (e.g, some words or finishing thoughts) or speech is not clear   Understanding Verbal and Non-Verbal Content Understanding Verbal and Non-Verbal Content: 4. Understands (complex and basic) - clear comprehension without cues or repetitions  Memory/Recall Ability Memory/Recall Ability : That he or she is in a hospital/hospital unit   Bedside Swallowing Assessment General Date of Onset: 09/22/22 Diet Prior to this Study: Regular;Thin liquids (Level 0);Cortrak/Small bore NG tube Respiratory Status: Room air History of Recent Intubation: Yes Total duration of intubation (days): 2 days Date extubated: 09/17/22 Behavior/Cognition: Alert;Cooperative Oral Cavity - Dentition: Edentulous Self-Feeding Abilities: Needs assist Patient Positioning: Upright in chair/Tumbleform Baseline Vocal Quality: Normal Volitional Cough: Strong Volitional Swallow: Able to elicit  Oral Care Assessment Oral Assessment  (WDL): Exceptions to WDL Lips: Symmetrical Teeth: Missing (Comment) Tongue: Pink Mucous Membrane(s): Moist;Pink Saliva: Moist, saliva free flowing Level of Consciousness: Alert Is patient on any of following O2 devices?: None of the above Nutritional status: On tube feedings Oral Assessment Risk : High Risk Ice Chips Ice chips: Not tested Thin Liquid Thin Liquid: Within functional limits Presentation: Self Fed;Straw;Cup Nectar Thick Nectar Thick Liquid: Not tested Honey Thick Honey Thick Liquid: Not tested Puree Puree: Within functional limits Solid Solid: Impaired Presentation: Self Fed Oral Phase Impairments: Impaired mastication Oral Phase Functional Implications: Impaired mastication BSE Assessment Risk for Aspiration Impact on safety and function: Mild aspiration  risk Other Related Risk Factors: Cognitive impairment  Short Term Goals: Week 1: SLP Short Term Goal 1 (Week 1): STG = LTG due to ELOS  Refer to Care Plan for Long Term Goals  Recommendations for other services: None   Discharge Criteria: Patient will be discharged from SLP if patient refuses treatment 3 consecutive times without medical reason, if treatment goals not met, if there is a change in medical status, if patient makes no progress towards goals or if patient is discharged from hospital.  The above assessment, treatment plan, treatment alternatives and goals were discussed and mutually agreed upon: by patient  Dalton Molesworth M.A., CF-SLP 09/25/2022, 12:35 PM

## 2022-09-25 NOTE — Plan of Care (Signed)
Patient settling in on the unit. Slept soundly without fragmentation. Remains alert to self and place. Incontinent of Bowel, mostly continent of bladder. Tube feeding continued with HOB > 30. Denies pain. CIWA protocol as planned to assess withdrawal symptoms (None observed). Safety maintained at all times.

## 2022-09-25 NOTE — Progress Notes (Signed)
PROGRESS NOTE   Subjective/Complaints: Appreciate dietician addressing vitamin deficiencies and recommending supplements Patient has no complaints this morning   Objective:   No results found. Recent Labs    09/24/22 0358 09/25/22 1605  WBC 7.4 9.2  HGB 6.9* 8.9*  HCT 21.3* 26.9*  PLT 244 317   Recent Labs    09/24/22 0358 09/25/22 1605  NA 139 138  K 5.2* 5.2*  CL 112* 106  CO2 21* 21*  GLUCOSE 133* 120*  BUN 44* 37*  CREATININE 1.40* 1.36*  CALCIUM 8.3* 8.8*    Intake/Output Summary (Last 24 hours) at 09/25/2022 1924 Last data filed at 09/25/2022 1652 Gross per 24 hour  Intake 358 ml  Output 300 ml  Net 58 ml     Pressure Injury 09/15/22 Vertebral column Mid Stage 1 -  Intact skin with non-blanchable redness of a localized area usually over a bony prominence. stage 1 mid back (Active)  09/15/22 2130  Location: Vertebral column  Location Orientation: Mid  Staging: Stage 1 -  Intact skin with non-blanchable redness of a localized area usually over a bony prominence.  Wound Description (Comments): stage 1 mid back  Present on Admission: Yes     Pressure Injury 09/15/22 Buttocks Right;Left;Lower Deep Tissue Pressure Injury - Purple or maroon localized area of discolored intact skin or blood-filled blister due to damage of underlying soft tissue from pressure and/or shear. DTI sacrum (Active)  09/15/22 2130  Location: Buttocks  Location Orientation: Right;Left;Lower  Staging: Deep Tissue Pressure Injury - Purple or maroon localized area of discolored intact skin or blood-filled blister due to damage of underlying soft tissue from pressure and/or shear.  Wound Description (Comments): DTI sacrum  Present on Admission: Yes    Physical Exam: Vital Signs Blood pressure 127/66, pulse (!) 102, temperature 98.7 F (37.1 C), resp. rate 18, SpO2 100%. Gen: no distress, normal appearing HENT:     Head:  Normocephalic.     Nose: Nose normal.     Comments: NGT in place Eyes:     Extraocular Movements: Extraocular movements intact.     Pupils: Pupils are equal, round, and reactive to light.  Cardiovascular:     Rate and Rhythm: Normal rate and regular rhythm.     Heart sounds: No murmur heard.    No gallop.  Pulmonary:     Effort: Pulmonary effort is normal. No respiratory distress.     Breath sounds: Rhonchi present.  Abdominal:     General: Bowel sounds are normal. There is no distension.     Palpations: Abdomen is soft.  Genitourinary:    Comments: Incontinent of stool Musculoskeletal:        General: No swelling or tenderness. Normal range of motion.     Cervical back: Normal range of motion.  Skin:    Findings: Bruising (BUE) present.     Comments: Healed excoriated areas bilateral knees, areas of breakdown back and buttocks, clean  Neurological:     Mental Status: He is alert and oriented to person, place, and time.     Comments: Alert and oriented to name, place, month, year, date, missed day by one. Reasonable insight and awareness.  Mild dysphonia. He was able to follow simple commands without difficulty. Slow to process and easily distracted. RUE 4+/5. LUE 4/5 prox to distal. RLE 4-/5 prox to 4/5 distally. LLE 3+/5 prox to 4/5 distally. No focal sensory findings. Normal muscle tone.  Psychiatric:     Comments: Pleasant, a little eccentric but cooperative   Assessment/Plan: 1. Functional deficits which require 3+ hours per day of interdisciplinary therapy in a comprehensive inpatient rehab setting. Physiatrist is providing close team supervision and 24 hour management of active medical problems listed below. Physiatrist and rehab team continue to assess barriers to discharge/monitor patient progress toward functional and medical goals  Care Tool:  Bathing    Body parts bathed by patient: Right arm, Left arm, Chest, Abdomen, Front perineal area, Right upper leg, Left upper  leg, Face   Body parts bathed by helper: Buttocks, Left lower leg, Right lower leg     Bathing assist Assist Level: Moderate Assistance - Patient 50 - 74%     Upper Body Dressing/Undressing Upper body dressing   What is the patient wearing?: Pull over shirt    Upper body assist Assist Level: Minimal Assistance - Patient > 75%    Lower Body Dressing/Undressing Lower body dressing      What is the patient wearing?: Incontinence brief, Pants     Lower body assist Assist for lower body dressing: Moderate Assistance - Patient 50 - 74%     Toileting Toileting    Toileting assist Assist for toileting: Maximal Assistance - Patient 25 - 49%     Transfers Chair/bed transfer  Transfers assist     Chair/bed transfer assist level: Minimal Assistance - Patient > 75%     Locomotion Ambulation   Ambulation assist      Assist level: Minimal Assistance - Patient > 75% Assistive device: Walker-rolling Max distance: 60ft   Walk 10 feet activity   Assist     Assist level: Minimal Assistance - Patient > 75% Assistive device: Walker-rolling   Walk 50 feet activity   Assist Walk 50 feet with 2 turns activity did not occur: Safety/medical concerns (fatigue)         Walk 150 feet activity   Assist Walk 150 feet activity did not occur: Safety/medical concerns         Walk 10 feet on uneven surface  activity   Assist Walk 10 feet on uneven surfaces activity did not occur: Safety/medical concerns         Wheelchair     Assist Is the patient using a wheelchair?: Yes Type of Wheelchair: Manual    Wheelchair assist level: Maximal Assistance - Patient 25 - 49% Max wheelchair distance: 65ft    Wheelchair 50 feet with 2 turns activity    Assist        Assist Level: Moderate Assistance - Patient 50 - 74%   Wheelchair 150 feet activity     Assist      Assist Level: Total Assistance - Patient < 25%   Blood pressure 127/66, pulse (!)  102, temperature 98.7 F (37.1 C), resp. rate 18, SpO2 100%.   Medical Problem List and Plan: 1. 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  Antithrombotics: -DVT/anticoagulation:  Pharmaceutical: Lovenox             -  antiplatelet therapy: ASA/ 3. Pain Management: Tylenol prn.  4. Mood/Behavior/Sleep: LCSW to follow for evaluation and support.              -antipsychotic agents: N/A             -wife is interested in neuropsych counseling for ?depression 5. Neuropsych/cognition: This patient is not fully capable of making decisions on his own behalf. 6. Skin/Wound Care: Air mattress for pressure relief and management of MASD/DTPI             --tube feeds and Prostat for malnutrition.              --Vitamin C added.  7. Fluids/Electrolytes/Nutrition: Monitor I/O. Continue tube feeds at nights             --monitor for signs of overload.  8. Aspiration PNA: Sputum positive for Staph Lugdunensis. Augmentin thru 09/14 to complete 7 day course. 9. ETOH abuse w/withdrawal seizures: wife interested in ETOH counseling.              -discussed that majority of etoh counseling would be after his rehab stay 10. Acute on chronic anemia: Hgb has trended down form mid 8's to 6.9 on 9/12-->transfused with 1 unit PRBC             --will order FOBT and monitor for other signs of bleeding.  11.  Sever thiamine deficiency @ 1.1: Supplemented parentally X 5 days. Continue oral supplement.  12. H/o Chronic HFrEF w/recovery. S/p BIV ICD: Continues to have soft BP limiting GDMT.             --strict I/O. Daily wts to monitor for signs of overload.  Continue ASA/Statin.             --continue to hold Lisinopril and metoprolol. Monitor HR/for symptoms with increase with activity.  13. PAD s/p stent/aortobifemoral BG: On ASA and statin. Off Plavix due to low platelets/anemia.  14.  Macrocytic anemia/ Low normal B12 @ 258: Started on supplement today. .   15. Dysphagia/chronic anorexia: Per family eats once a day.  Diet upgraded to regular             --continue on tube feeds at nights.  16. CKD?: SCr- 2.16 @ admission. Hyperkalemia today treated with lokelma --will change tube feeds to nephro due to worsening of renal status--BUN/SCr 44/1.4/K-5.2 --on water flushes 300 cc/QID. Strict I/O.    17. Magnesium deficiency: supplement 2grams IV today  18. Phosphorus deficiency: repeat tomorrow, will not replete with kphos while potassium is elevated  19. Hyperkalemia: lokelma ordered  20. Vitamin A deficiency: start 100,000 units daily for 3 days and then switch to 50,000U daily for 14 days    LOS: 1 days A FACE TO FACE EVALUATION WAS PERFORMED  Drema Pry Chelsie Burel 09/25/2022, 7:24 PM

## 2022-09-25 NOTE — Progress Notes (Signed)
Inpatient Rehabilitation  Patient information reviewed and entered into eRehab system by Oyuki Hogan M. Eri Mcevers, M.A., CCC/SLP, PPS Coordinator.  Information including medical coding, functional ability and quality indicators will be reviewed and updated through discharge.    

## 2022-09-26 LAB — BASIC METABOLIC PANEL
Anion gap: 11 (ref 5–15)
BUN: 38 mg/dL — ABNORMAL HIGH (ref 8–23)
CO2: 20 mmol/L — ABNORMAL LOW (ref 22–32)
Calcium: 9 mg/dL (ref 8.9–10.3)
Chloride: 104 mmol/L (ref 98–111)
Creatinine, Ser: 1.34 mg/dL — ABNORMAL HIGH (ref 0.61–1.24)
GFR, Estimated: 58 mL/min — ABNORMAL LOW (ref >60)
Glucose, Bld: 124 mg/dL — ABNORMAL HIGH (ref 70–99)
Potassium: 5.2 mmol/L — ABNORMAL HIGH (ref 3.5–5.1)
Sodium: 135 mmol/L (ref 135–145)

## 2022-09-26 LAB — GLUCOSE, CAPILLARY
Glucose-Capillary: 122 mg/dL — ABNORMAL HIGH (ref 70–99)
Glucose-Capillary: 96 mg/dL (ref 70–99)
Glucose-Capillary: 124 mg/dL — ABNORMAL HIGH (ref 70–99)
Glucose-Capillary: 113 mg/dL — ABNORMAL HIGH (ref 70–99)
Glucose-Capillary: 118 mg/dL — ABNORMAL HIGH (ref 70–99)

## 2022-09-26 LAB — MAGNESIUM: Magnesium: 1.8 mg/dL (ref 1.7–2.4)

## 2022-09-26 LAB — PHOSPHORUS: Phosphorus: 2.5 mg/dL (ref 2.5–4.6)

## 2022-09-26 MED ORDER — VITAMIN A 3 MG (10000 UNIT) PO CAPS
100000.0000 [IU] | ORAL_CAPSULE | Freq: Every day | ORAL | Status: DC
  Filled 2022-09-26: qty 10

## 2022-09-26 MED ORDER — VITAMIN B-6 100 MG PO TABS
100.0000 mg | ORAL_TABLET | Freq: Every day | ORAL | Status: DC
  Administered 2022-09-27 – 2022-10-02 (×6): 100 mg via ORAL
  Filled 2022-09-26 (×7): qty 1

## 2022-09-26 MED ORDER — SODIUM ZIRCONIUM CYCLOSILICATE 5 G PO PACK
5.0000 g | PACK | Freq: Once | ORAL | Status: AC
  Administered 2022-09-26: 5 g via ORAL
  Filled 2022-09-26: qty 1

## 2022-09-26 MED ORDER — VITAMIN A 3 MG (10000 UNIT) PO CAPS: 50000.0000 [IU] | ORAL_CAPSULE | Freq: Every day | ORAL | Status: DC

## 2022-09-26 MED ORDER — ZINC SULFATE 220 (50 ZN) MG PO CAPS
220.0000 mg | ORAL_CAPSULE | Freq: Every day | ORAL | Status: DC
  Administered 2022-09-27 – 2022-10-02 (×6): 220 mg via ORAL
  Filled 2022-09-26 (×6): qty 1

## 2022-09-26 MED ORDER — PANTOPRAZOLE SODIUM 40 MG PO TBEC
40.0000 mg | DELAYED_RELEASE_TABLET | Freq: Every day | ORAL | Status: DC
  Administered 2022-09-27 – 2022-10-02 (×6): 40 mg via ORAL
  Filled 2022-09-26 (×6): qty 1

## 2022-09-26 MED ORDER — VITAL AF 1.2 CAL PO LIQD
1000.0000 mL | Freq: Every day | ORAL | Status: AC
  Administered 2022-09-26: 1000 mL
  Filled 2022-09-26: qty 1000

## 2022-09-26 MED ORDER — LOPERAMIDE HCL 2 MG PO CAPS
2.0000 mg | ORAL_CAPSULE | Freq: Once | ORAL | Status: AC
  Administered 2022-09-26: 2 mg via ORAL
  Filled 2022-09-26: qty 1

## 2022-09-26 NOTE — Progress Notes (Signed)
PROGRESS NOTE   Subjective/Complaints: No new complaints this morning Discussed with nursing that he refused vitamins, discussed with patient and he said because they were too large  ROS: feels full from tube feeds   Objective:   No results found. Recent Labs    09/24/22 0358 09/25/22 1605  WBC 7.4 9.2  HGB 6.9* 8.9*  HCT 21.3* 26.9*  PLT 244 317   Recent Labs    09/24/22 0358 09/25/22 1605  NA 139 138  K 5.2* 5.2*  CL 112* 106  CO2 21* 21*  GLUCOSE 133* 120*  BUN 44* 37*  CREATININE 1.40* 1.36*  CALCIUM 8.3* 8.8*    Intake/Output Summary (Last 24 hours) at 09/26/2022 1333 Last data filed at 09/26/2022 0934 Gross per 24 hour  Intake 237 ml  Output 1000 ml  Net -763 ml     Pressure Injury 09/15/22 Vertebral column Mid Stage 1 -  Intact skin with non-blanchable redness of a localized area usually over a bony prominence. stage 1 mid back (Active)  09/15/22 2130  Location: Vertebral column  Location Orientation: Mid  Staging: Stage 1 -  Intact skin with non-blanchable redness of a localized area usually over a bony prominence.  Wound Description (Comments): stage 1 mid back  Present on Admission: Yes     Pressure Injury 09/15/22 Buttocks Right;Left;Lower Deep Tissue Pressure Injury - Purple or maroon localized area of discolored intact skin or blood-filled blister due to damage of underlying soft tissue from pressure and/or shear. DTI sacrum (Active)  09/15/22 2130  Location: Buttocks  Location Orientation: Right;Left;Lower  Staging: Deep Tissue Pressure Injury - Purple or maroon localized area of discolored intact skin or blood-filled blister due to damage of underlying soft tissue from pressure and/or shear.  Wound Description (Comments): DTI sacrum  Present on Admission: Yes    Physical Exam: Vital Signs Blood pressure 102/65, pulse (!) 106, temperature 98.5 F (36.9 C), temperature source Oral,  resp. rate 18, SpO2 98%. Gen: no distress, normal appearing HENT:     Head: Normocephalic.     Nose: Nose normal.     Comments: NGT in place Eyes:     Extraocular Movements: Extraocular movements intact.     Pupils: Pupils are equal, round, and reactive to light.  Cardiovascular:    Tachycardia    Heart sounds: No murmur heard.    No gallop.  Pulmonary:     Effort: Pulmonary effort is normal. No respiratory distress.     Breath sounds: Rhonchi present.  Abdominal:     General: Bowel sounds are normal. There is no distension.     Palpations: Abdomen is soft.  Genitourinary:    Comments: Incontinent of stool Musculoskeletal:        General: No swelling or tenderness. Normal range of motion.     Cervical back: Normal range of motion.  Skin:    Findings: Bruising (BUE) present.     Comments: Healed excoriated areas bilateral knees, areas of breakdown back and buttocks, clean  Neurological:     Mental Status: He is alert and oriented to person, place, and time.     Comments: Alert and oriented to name, place, month,  year, date, missed day by one. Reasonable insight and awareness. Mild dysphonia. He was able to follow simple commands without difficulty. Slow to process and easily distracted. RUE 4+/5. LUE 4/5 prox to distal. RLE 4-/5 prox to 4/5 distally. LLE 3+/5 prox to 4/5 distally. No focal sensory findings. Normal muscle tone.  Psychiatric:     Comments: Pleasant, a little eccentric but cooperative   Assessment/Plan: 1. Functional deficits which require 3+ hours per day of interdisciplinary therapy in a comprehensive inpatient rehab setting. Physiatrist is providing close team supervision and 24 hour management of active medical problems listed below. Physiatrist and rehab team continue to assess barriers to discharge/monitor patient progress toward functional and medical goals  Care Tool:  Bathing    Body parts bathed by patient: Right arm, Left arm, Chest, Abdomen, Front  perineal area, Right upper leg, Left upper leg, Face   Body parts bathed by helper: Buttocks, Left lower leg, Right lower leg     Bathing assist Assist Level: Moderate Assistance - Patient 50 - 74%     Upper Body Dressing/Undressing Upper body dressing   What is the patient wearing?: Pull over shirt    Upper body assist Assist Level: Minimal Assistance - Patient > 75%    Lower Body Dressing/Undressing Lower body dressing      What is the patient wearing?: Incontinence brief, Pants     Lower body assist Assist for lower body dressing: Moderate Assistance - Patient 50 - 74%     Toileting Toileting    Toileting assist Assist for toileting: Maximal Assistance - Patient 25 - 49%     Transfers Chair/bed transfer  Transfers assist     Chair/bed transfer assist level: Minimal Assistance - Patient > 75%     Locomotion Ambulation   Ambulation assist      Assist level: Contact Guard/Touching assist Assistive device: Walker-rolling Max distance: 180   Walk 10 feet activity   Assist     Assist level: Contact Guard/Touching assist Assistive device: Walker-rolling   Walk 50 feet activity   Assist Walk 50 feet with 2 turns activity did not occur: Safety/medical concerns (fatigue)  Assist level: Contact Guard/Touching assist Assistive device: Walker-rolling    Walk 150 feet activity   Assist Walk 150 feet activity did not occur: Safety/medical concerns  Assist level: Contact Guard/Touching assist Assistive device: Walker-rolling    Walk 10 feet on uneven surface  activity   Assist Walk 10 feet on uneven surfaces activity did not occur: Safety/medical concerns         Wheelchair     Assist Is the patient using a wheelchair?: Yes Type of Wheelchair: Manual    Wheelchair assist level: Maximal Assistance - Patient 25 - 49% Max wheelchair distance: 65ft    Wheelchair 50 feet with 2 turns activity    Assist        Assist Level:  Moderate Assistance - Patient 50 - 74%   Wheelchair 150 feet activity     Assist      Assist Level: Total Assistance - Patient < 25%   Blood pressure 102/65, pulse (!) 106, temperature 98.5 F (36.9 C), temperature source Oral, resp. rate 18, SpO2 98%.   Medical Problem List and Plan: 1. 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  Antithrombotics: -DVT/anticoagulation:  Pharmaceutical: Lovenox             -antiplatelet therapy: ASA/ 3. Pain Management: Tylenol prn.  4. Mood/Behavior/Sleep: LCSW to follow for evaluation and support.              -antipsychotic agents: N/A             -wife is interested in neuropsych counseling for ?depression 5. Neuropsych/cognition: This patient is not fully capable of making decisions on his own behalf. 6. Skin/Wound Care: Air mattress for pressure relief and management of MASD/DTPI             --tube feeds and Prostat for malnutrition.              --Vitamin C added.  7. Fluids/Electrolytes/Nutrition: Monitor I/O. Continue tube feeds at nights             --monitor for signs of overload.  8. Aspiration PNA: Sputum positive for Staph Lugdunensis. Augmentin thru 09/14 to complete 7 day course. 9. ETOH abuse w/withdrawal seizures: wife interested in ETOH counseling.              -discussed that majority of etoh counseling would be after his rehab stay 10. Acute on chronic anemia: Hgb has trended down form mid 8's to 6.9 on 9/12-->transfused with 1 unit PRBC             --will order FOBT and monitor for other signs of bleeding.  11.  Sever thiamine deficiency @ 1.1: Supplemented parentally X 5 days. Continue oral supplement.  12. H/o Chronic HFrEF w/recovery. S/p BIV ICD: Continues to have soft BP limiting GDMT.             --strict I/O. Daily wts to monitor for signs of overload.  Continue ASA/Statin.              --continue to hold Lisinopril and metoprolol. Monitor HR/for symptoms with increase with activity.  13. PAD s/p stent/aortobifemoral BG: On ASA and statin. Off Plavix due to low platelets/anemia.  14. Macrocytic anemia/ Low normal B12 @ 258: Started on supplement today. .   15. Dysphagia/chronic anorexia: Per family eats once a day.  Diet upgraded to regular             --continue on tube feeds at nights.  16. CKD?: SCr- 2.16 @ admission. Hyperkalemia today treated with lokelma --will change tube feeds to nephro due to worsening of renal status--BUN/SCr 44/1.4/K-5.2 --on water flushes 300 cc/QID. Strict I/O.    17. Magnesium deficiency: supplement 2grams IV today  18. Phosphorus deficiency: repeat today, will not replete with kphos while potassium is elevated  19. Hyperkalemia: lokelma ordered, add on BMP  20. Vitamin A deficiency: start 100,000 units daily for 3 days and then switch to 50,000U daily for 14 days, messaged pharmacy to see if this can be crushed  21. Tachycardia: add on potassium and magnesium levels.   22. Early satiety: 2/2 TF, discussed decreasing TF to HS and he and family are agreeable  Greater than 50 minutes spent in discussion with nursing that patient refused vitamins, educating patient regarding low levels of vitamins, determining that reason for refusal was size of pills, messaging pharmacy to see if these can be crushed, adding on potassium and magnesium levels, changed TF to HS given early satiety so patient can tolerate more real food.     LOS: 2 days A FACE TO FACE EVALUATION WAS PERFORMED  Derek Oconnell 09/26/2022, 1:33 PM

## 2022-09-26 NOTE — Progress Notes (Signed)
Occupational Therapy Session Note  Patient Details  Name: [name] MRN: [id number] Date of Birth: 08/05/1955  Today's Date: 09/26/2022 OT Individual Time: 4098-1191 session 1 OT Individual Time Calculation (min): 72 min  Session 2 1306-1340 ( 11 mins missed d/t fatigue)    Short Term Goals: Week 1:  OT Short Term Goal 1 (Week 1): STG=LTG due to LOS  Skilled Therapeutic Interventions/Progress Updates:  Session 1: Pt greeted supine in bed, pt agreeable to OT intervention. Session focus on BADL reeducation, functional mobility, dynamic standing balance and decreasing overall caregiver burden.     Pt shown options for clothes with pt needing one option at a time to decide which items he wanted to don d/t impaired attention. Pt preferred to don pants and underwear from supine with set- up assist. Pt transitioned to EOB with supervision, pt donned pants from EOB with CGA via sit>stand with Rw. Once pt donned LB clothing pt returned self to supine to take meds. Pt then states that he had a BM. Pt ambulated to bathroom with Rw and MAX cues for Rw mgmt, CGA overall for balance. Pt noted to have large loose stool. Pt provided with washcloths and able to complete LB bathing from toilet but did provide MIN A  for cleanliness.   Pt returned to EOB with RW and CGA, MAX cues for RW mgmt. Pt needed MOD A to doff OH shirt, set- up to don clean [place] shirt.  Pt returned self to supine needing to finish taking meds. Discussed DME needs during this time with pt reporting he already has a shower seat and typically uses sink to push up from toilet.  Pt sat up for breakfast/self feeding with no FMC deficits noted. Pt was highly distractible during feeding as pt preferring to attend to his phone vs eat breakfast, MAX cues needed to attend to feeding.   Ended session with pt supine in bed with all needs within reach.               Session 2: pt greeted seated in recliner with pts wife present. Pt reports need to  void bowels. Pt completed ambulatory transfer with Rw and CGA to bathroom, pt needed MAX cues for Rw mgmt. Noted that pt already had incontinent bowel void. Pt needed increased time to continue having BM. Pt able to complete posterior/anterior pericare from toilet with set-up assist. Retrieved paper pants with pt able to don with set- up assist. After completing toileting hygiene pt reports fatigue requesting to return back to recliner. Pt completed functional ambulation back to recliner with Rw and CGA. Ended session with pt seated in recliner with alarm belt activated and all needs within reach.   Therapy Documentation Precautions:  Precautions Precautions: Fall Precaution Comments: cortrak, ETOH abuse, watch HR Restrictions Weight Bearing Restrictions: No    Pain: No pain reported during either session     Therapy/Group: Individual Therapy  Pollyann Glen [facility] 09/26/2022, 10:07 AM

## 2022-09-26 NOTE — Progress Notes (Signed)
Physical Therapy Session Note  Patient Details  Name: Derek Oconnell MRN: [id number] Date of Birth: [birth year]  Today's Date: 09/26/2022 PT Individual Time: 1002-1115 PT Individual Time Calculation (min): 73 min   Short Term Goals: Week 1:  PT Short Term Goal 1 (Week 1): STG = LTG  Skilled Therapeutic Interventions/Progress Updates:  Pt presents semi-reclined in bed and agreeable to therapy.  Pt transfers sup to sit w/ CGA and sat EOB stating some lightheadeness but resolved.  Pt transfers sit to stand and SPT bed > w/c w/ min A and cues for hand placement.  Pt wheeled to dayroom for energy conservation.  Cleaned area of dried blood from R elbow and covered cut w/ bandaid, after consult w/ RN.  Pt itching dry skin.  Pt amb 180 x 3 trials w/ RW and CGA, cues for walker management and upright posture.  Pt required seated rest break between trials.  Pt amb x 10' to Nu-step w/o UE support and mod A.  Pt performed Nu-step at Level 3 x 3' x 2 w/ seated rest break between.  Pt amb back to w/c w/o AD.  Pt performed standing toe taps to 3 3/4" platform w/ HHA and cues for BOS and speed.  Pt returned to room and amb from doorway to recliner w/ CGA and occasional cues for walker management, especially w/ turns.  Pt remained sitting in recliner w/ chair alarm on and all needs in reach.     Therapy Documentation Precautions:  Precautions Precautions: Fall Precaution Comments: cortrak, ETOH abuse, watch HR Restrictions Weight Bearing Restrictions: No General:   Vital Signs:   Pain:0/10       Therapy/Group: Individual Therapy  Lucio Edward 09/26/2022, 11:18 AM

## 2022-09-26 NOTE — Progress Notes (Signed)
Supplements per RD note 9/13 entered for MD per request.   Marilyne Haseley A. Jeanella Craze, PharmD, BCPS, FNKF Clinical Pharmacist [place] Please utilize Amion for appropriate phone number to reach the unit pharmacist [facility])

## 2022-09-27 LAB — BASIC METABOLIC PANEL
Anion gap: 8 (ref 5–15)
BUN: 40 mg/dL — ABNORMAL HIGH (ref 8–23)
CO2: 21 mmol/L — ABNORMAL LOW (ref 22–32)
Calcium: 8.9 mg/dL (ref 8.9–10.3)
Chloride: 106 mmol/L (ref 98–111)
Creatinine, Ser: 1.44 mg/dL — ABNORMAL HIGH (ref 0.61–1.24)
GFR, Estimated: 54 mL/min — ABNORMAL LOW (ref >60)
Glucose, Bld: 113 mg/dL — ABNORMAL HIGH (ref 70–99)
Potassium: 5.1 mmol/L (ref 3.5–5.1)
Sodium: 135 mmol/L (ref 135–145)

## 2022-09-27 LAB — GLUCOSE, CAPILLARY
Glucose-Capillary: 100 mg/dL — ABNORMAL HIGH (ref 70–99)
Glucose-Capillary: 117 mg/dL — ABNORMAL HIGH (ref 70–99)
Glucose-Capillary: 136 mg/dL — ABNORMAL HIGH (ref 70–99)
Glucose-Capillary: 96 mg/dL (ref 70–99)

## 2022-09-27 LAB — MAGNESIUM: Magnesium: 1.8 mg/dL (ref 1.7–2.4)

## 2022-09-27 MED ORDER — VITAL AF 1.2 CAL PO LIQD
1000.0000 mL | Freq: Every day | ORAL | Status: AC
  Administered 2022-09-27: 1000 mL
  Filled 2022-09-27: qty 1000

## 2022-09-27 MED ORDER — MAGNESIUM SULFATE 2 GM/50ML IV SOLN
2.0000 g | Freq: Once | INTRAVENOUS | Status: AC
  Administered 2022-09-27: 2 g via INTRAVENOUS
  Filled 2022-09-27: qty 50

## 2022-09-27 NOTE — IPOC Note (Signed)
Overall Plan of Care [facility]) Patient Details Name: Derek Oconnell MRN: [id number] DOB: 04/23/1955  Admitting Diagnosis: CVA (cerebral vascular accident) [facility])  Hospital Problems: Principal Problem:   CVA (cerebral vascular accident) [facility])     Functional Problem List: Nursing Bowel, Safety, Endurance, Skin Integrity, Bladder  PT Balance, Endurance, Motor, Safety, Skin Integrity  OT Balance, Behavior, Cognition, Endurance, Safety  SLP Cognition, Nutrition  TR         Basic ADL's: OT Bathing, Dressing, Toileting     Advanced  ADL's: OT       Transfers: PT Bed Mobility, Bed to Chair, Customer service manager, Tub/Shower     Locomotion: PT Ambulation, Stairs     Additional Impairments: OT None  SLP Swallowing, Social Cognition   Problem Solving, Memory, Awareness  TR      Anticipated Outcomes Item Anticipated Outcome  Self Feeding Independent (hand to mouth)  Swallowing  modI   Basic self-care  Supervision/ Set up  Toileting  Supervision   Bathroom Transfers Supervision  Bowel/Bladder  manage bowel w mod I and bladder w toileting  Transfers  supervision  Locomotion  supervision  Communication     Cognition  supervision A  Pain  n/a  Safety/Judgment  manage w cues   Therapy Plan: PT Intensity: Minimum of 1-2 x/day ,45 to 90 minutes PT Frequency: 5 out of 7 days PT Duration Estimated Length of Stay: 7-9 days OT Intensity: Minimum of 1-2 x/day, 45 to 90 minutes OT Frequency: 5 out of 7 days OT Duration/Estimated Length of Stay: 7 days SLP Intensity: Minumum of 1-2 x/day, 30 to 90 minutes SLP Frequency: 3 to 5 out of 7 days SLP Duration/Estimated Length of Stay: 7-10 days   Team Interventions: Nursing Interventions Patient/Family Education, Medication Management, Discharge Planning, Disease Management/Prevention, Cognitive Remediation/Compensation, Skin Care/Wound Management, Bowel Management, Dysphagia/Aspiration Precaution Training  PT interventions  Ambulation/gait training, Discharge planning, Functional mobility training, Psychosocial support, Therapeutic Activities, Therapeutic Exercise, Wheelchair propulsion/positioning, Neuromuscular re-education, Disease management/prevention, Balance/vestibular training, Skin care/wound management, UE/LE Strength taining/ROM, Splinting/orthotics, Pain management, DME/adaptive equipment instruction, Cognitive remediation/compensation, Community reintegration, Development worker, international aid stimulation, Patient/family education, Museum/gallery curator, UE/LE Coordination activities  OT Interventions Warden/ranger, Disease mangement/prevention, Patient/family education, Self Care/advanced ADL retraining, Therapeutic Exercise, UE/LE Coordination activities, UE/LE Strength taining/ROM, Therapeutic Activities, Skin care/wound managment, Psychosocial support, Functional mobility training, DME/adaptive equipment instruction, Discharge planning, Cognitive remediation/compensation  SLP Interventions Cognitive remediation/compensation, Dysphagia/aspiration precaution training, Internal/external aids, Cueing hierarchy, Environmental controls, Therapeutic Activities, Functional tasks, Patient/family education, Therapeutic Exercise  TR Interventions    SW/CM Interventions Discharge Planning, Psychosocial Support, Patient/Family Education, Disease Management/Prevention   Barriers to Discharge MD  Medical stability  Nursing Decreased caregiver support, Home environment access/layout, Incontinence, Wound Care multi level main B+B 7/4 ste bil rail w spouse; went out once a week PTA.poor activity tolerance, weakness, poor insight, incontinent stool  PT Decreased caregiver support, Lack of/limited family support, Insurance for SNF coverage, Nutrition means    OT      SLP      SW Lack of/limited family support, Decreased caregiver support     Team Discharge Planning: Destination: PT-Home ,OT- Home , SLP-Home Projected  Follow-up: PT-Home health PT, 24 hour supervision/assistance, OT-  Home health OT, SLP-Home Health SLP Projected Equipment Needs: PT-To be determined, OT- To be determined, SLP-None recommended by SLP Equipment Details: PT- , OT-  Patient/family involved in discharge planning: PT- Patient, Family member/caregiver,  OT-Patient, SLP-Patient  MD ELOS: 7-10 days Medical Rehab Prognosis:  Excellent Assessment:The  patient has been admitted for CIR therapies with the diagnosis of right parietal lobe infarcts. The team will be addressing functional mobility, strength, stamina, balance, safety, adaptive techniques and equipment, self-care, bowel and bladder mgt, patient and caregiver education. Goals have been set at supervision. Anticipated discharge destination is home.        See Team Conference Notes for weekly updates to the plan of care

## 2022-09-27 NOTE — Plan of Care (Signed)
  Problem: Consults Goal: RH STROKE PATIENT EDUCATION Description: See Patient Education module for education specifics  Outcome: Progressing   Problem: RH BOWEL ELIMINATION Goal: RH STG MANAGE BOWEL WITH ASSISTANCE Description: STG Manage Bowel with toileting Assistance. Outcome: Progressing Goal: RH STG MANAGE BOWEL W/MEDICATION W/ASSISTANCE Description: STG Manage Bowel with Medication with mod I Assistance. Outcome: Progressing   Problem: RH BLADDER ELIMINATION Goal: RH STG MANAGE BLADDER WITH ASSISTANCE Description: STG Manage Bladder With toileting Assistance Outcome: Progressing   Problem: RH SKIN INTEGRITY Goal: RH STG SKIN FREE OF INFECTION/BREAKDOWN Description: Manage w min assist Outcome: Progressing Goal: RH STG MAINTAIN SKIN INTEGRITY WITH ASSISTANCE Description: STG Maintain Skin Integrity With Assistance. Outcome: Progressing Goal: RH STG ABLE TO PERFORM INCISION/WOUND CARE W/ASSISTANCE Description: STG Able To Perform Incision/Wound Care With min Assistance. Outcome: Progressing   Problem: RH SAFETY Goal: RH STG ADHERE TO SAFETY PRECAUTIONS W/ASSISTANCE/DEVICE Description: STG Adhere to Safety Precautions With Assistance/Device. Outcome: Progressing   Problem: RH KNOWLEDGE DEFICIT Goal: RH STG INCREASE KNOWLEDGE OF HYPERTENSION Description: Patient and wife will be able to manage HTN with medications and dietary modifications using educational resources independently Outcome: Progressing Goal: RH STG INCREASE KNOWLEDGE OF DYSPHAGIA/FLUID INTAKE Description: Patient and wife will be able to manage dysphagia, medications and dietary modifications using educational resources independently Outcome: Progressing Goal: RH STG INCREASE KNOWLEGDE OF HYPERLIPIDEMIA Description: Patient and wife will be able to manage HLD with medications and dietary modifications using educational resources independently Outcome: Progressing Goal: RH STG INCREASE KNOWLEDGE OF  STROKE PROPHYLAXIS Description: Patient and wife will be able to manage secondary risks with medications and dietary modifications using educational resources independently Outcome: Progressing

## 2022-09-27 NOTE — Progress Notes (Signed)
PROGRESS NOTE   Subjective/Complaints: No new complaints this morning Ate 25% of breakfast Discussed we can d/c TF if he eats 50% of lunch  ROS: feels full from tube feeds, denies pain   Objective:   No results found. Recent Labs    09/25/22 1605  WBC 9.2  HGB 8.9*  HCT 26.9*  PLT 317   Recent Labs    09/26/22 1228 09/27/22 0825  NA 135 135  K 5.2* 5.1  CL 104 106  CO2 20* 21*  GLUCOSE 124* 113*  BUN 38* 40*  CREATININE 1.34* 1.44*  CALCIUM 9.0 8.9    Intake/Output Summary (Last 24 hours) at 09/27/2022 1203 Last data filed at 09/27/2022 1610 Gross per 24 hour  Intake 1545 ml  Output 950 ml  Net 595 ml     Pressure Injury 09/15/22 Vertebral column Mid Stage 1 -  Intact skin with non-blanchable redness of a localized area usually over a bony prominence. stage 1 mid back (Active)  09/15/22 2130  Location: Vertebral column  Location Orientation: Mid  Staging: Stage 1 -  Intact skin with non-blanchable redness of a localized area usually over a bony prominence.  Wound Description (Comments): stage 1 mid back  Present on Admission: Yes     Pressure Injury 09/15/22 Buttocks Right;Left;Lower Deep Tissue Pressure Injury - Purple or maroon localized area of discolored intact skin or blood-filled blister due to damage of underlying soft tissue from pressure and/or shear. DTI sacrum (Active)  09/15/22 2130  Location: Buttocks  Location Orientation: Right;Left;Lower  Staging: Deep Tissue Pressure Injury - Purple or maroon localized area of discolored intact skin or blood-filled blister due to damage of underlying soft tissue from pressure and/or shear.  Wound Description (Comments): DTI sacrum  Present on Admission: Yes    Physical Exam: Vital Signs Blood pressure (!) 104/34, pulse (!) 106, temperature 98.7 F (37.1 C), temperature source Oral, resp. rate 18, SpO2 100%. Gen: no distress, normal appearing HENT:      Head: Normocephalic.     Nose: Nose normal.     Comments: NGT in place Eyes:     Extraocular Movements: Extraocular movements intact.     Pupils: Pupils are equal, round, and reactive to light.  Cardiovascular:    Tachycardia    Heart sounds: No murmur heard.    No gallop.  Pulmonary:     Effort: Pulmonary effort is normal. No respiratory distress.     Breath sounds: Rhonchi present.  Abdominal:     General: Bowel sounds are normal. There is no distension.     Palpations: Abdomen is soft.  Genitourinary:    Comments: Incontinent of stool Musculoskeletal:        General: No swelling or tenderness. Normal range of motion.     Cervical back: Normal range of motion.  Skin:    Findings: Bruising (BUE) present.     Comments: Healed excoriated areas bilateral knees, areas of breakdown back and buttocks, clean  Neurological:     Mental Status: He is alert and oriented to person, place, and time.     Comments: Alert and oriented to name, place, month, year, date, missed day by one.  Reasonable insight and awareness. Mild dysphonia. He was able to follow simple commands without difficulty. Slow to process and easily distracted. RUE 4+/5. LUE 4/5 prox to distal. RLE 4-/5 prox to 4/5 distally. LLE 3+/5 prox to 4/5 distally. No focal sensory findings. Normal muscle tone.  Ambualting CG with RW Psychiatric:     Comments: Pleasant, a little eccentric but cooperative   Assessment/Plan: 1. Functional deficits which require 3+ hours per day of interdisciplinary therapy in a comprehensive inpatient rehab setting. Physiatrist is providing close team supervision and 24 hour management of active medical problems listed below. Physiatrist and rehab team continue to assess barriers to discharge/monitor patient progress toward functional and medical goals  Care Tool:  Bathing    Body parts bathed by patient: Right arm, Left arm, Chest, Abdomen, Front perineal area, Right upper leg, Left upper leg,  Face   Body parts bathed by helper: Buttocks, Left lower leg, Right lower leg     Bathing assist Assist Level: Moderate Assistance - Patient 50 - 74%     Upper Body Dressing/Undressing Upper body dressing   What is the patient wearing?: Pull over shirt    Upper body assist Assist Level: Minimal Assistance - Patient > 75%    Lower Body Dressing/Undressing Lower body dressing      What is the patient wearing?: Incontinence brief, Pants     Lower body assist Assist for lower body dressing: Moderate Assistance - Patient 50 - 74%     Toileting Toileting    Toileting assist Assist for toileting: Maximal Assistance - Patient 25 - 49%     Transfers Chair/bed transfer  Transfers assist     Chair/bed transfer assist level: Minimal Assistance - Patient > 75%     Locomotion Ambulation   Ambulation assist      Assist level: Contact Guard/Touching assist Assistive device: Walker-rolling Max distance: 180   Walk 10 feet activity   Assist     Assist level: Contact Guard/Touching assist Assistive device: Walker-rolling   Walk 50 feet activity   Assist Walk 50 feet with 2 turns activity did not occur: Safety/medical concerns (fatigue)  Assist level: Contact Guard/Touching assist Assistive device: Walker-rolling    Walk 150 feet activity   Assist Walk 150 feet activity did not occur: Safety/medical concerns  Assist level: Contact Guard/Touching assist Assistive device: Walker-rolling    Walk 10 feet on uneven surface  activity   Assist Walk 10 feet on uneven surfaces activity did not occur: Safety/medical concerns         Wheelchair     Assist Is the patient using a wheelchair?: Yes Type of Wheelchair: Manual    Wheelchair assist level: Maximal Assistance - Patient 25 - 49% Max wheelchair distance: 70ft    Wheelchair 50 feet with 2 turns activity    Assist        Assist Level: Moderate Assistance - Patient 50 - 74%    Wheelchair 150 feet activity     Assist      Assist Level: Total Assistance - Patient < 25%   Blood pressure (!) 104/34, pulse (!) 106, temperature 98.7 F (37.1 C), temperature source Oral, resp. rate 18, SpO2 100%.   Medical Problem List and Plan: 1. Functional deficits secondary to right parietal lobe infarcts             -patient may  shower             -ELOS/Goals: 7-10+ days, supervision goals with PT, OT,  SLP 2.  Antithrombotics: -DVT/anticoagulation:  Pharmaceutical: Lovenox             -antiplatelet therapy: ASA/ 3. Pain Management: Tylenol prn.  4. Mood/Behavior/Sleep: LCSW to follow for evaluation and support.              -antipsychotic agents: N/A             -wife is interested in neuropsych counseling for ?depression 5. Neuropsych/cognition: This patient is not fully capable of making decisions on his own behalf. 6. Skin/Wound Care: Air mattress for pressure relief and management of MASD/DTPI             --tube feeds and Prostat for malnutrition.              --Vitamin C added.  7. Fluids/Electrolytes/Nutrition: Monitor I/O. Continue tube feeds at nights             --monitor for signs of overload.  8. Aspiration PNA: Sputum positive for Staph Lugdunensis. Augmentin thru 09/14 to complete 7 day course. 9. ETOH abuse w/withdrawal seizures: wife interested in ETOH counseling.              -discussed that majority of etoh counseling would be after his rehab stay 10. Acute on chronic anemia: Hgb has trended down form mid 8's to 6.9 on 9/12-->transfused with 1 unit PRBC             --will order FOBT and monitor for other signs of bleeding.  11.  Sever thiamine deficiency @ 1.1: Supplemented parentally X 5 days. Continue oral supplement.  12. H/o Chronic HFrEF w/recovery. S/p BIV ICD: Continues to have soft BP limiting GDMT.             --strict I/O. Daily wts to monitor for signs of overload.  Continue ASA/Statin.             --continue to hold Lisinopril and  metoprolol. Monitor HR/for symptoms with increase with activity.  13. PAD s/p stent/aortobifemoral BG: On ASA and statin. Off Plavix due to low platelets/anemia.  14. Macrocytic anemia/ Low normal B12 @ 258: Started on supplement today. .   15. Dysphagia/chronic anorexia: Per family eats once a day.  Diet upgraded to regular             --continue on tube feeds at nights.  16. CKD?: SCr- 2.16 @ admission. Hyperkalemia today treated with lokelma --will change tube feeds to nephro due to worsening of renal status--BUN/SCr 44/1.4/K-5.2 --on water flushes 300 cc/QID. Strict I/O.    17. Magnesium deficiency: supplement 2grams IV today  18. Phosphorus deficiency: repeat today, will not replete with kphos while potassium is elevated  19. Hyperkalemia: lokelma ordered previously, 9/15 BMP reviewed and has resolved  20. Vitamin A deficiency: will hold off on supplementation as pills are uncomfortably large for patient to swallow  21. Tachycardia: 2 grams IV mag ordered 9/15  22. Early satiety: 2/2 TF, discussed decreasing TF to HS and he and family are agreeable, discussed stopping TF once he eats 50% meals      LOS: 3 days A FACE TO FACE EVALUATION WAS PERFORMED  Derek Oconnell P Riven Beebe 09/27/2022, 12:03 PM

## 2022-09-28 DIAGNOSIS — I69391 Dysphagia following cerebral infarction: Secondary | ICD-10-CM

## 2022-09-28 DIAGNOSIS — N1831 Chronic kidney disease, stage 3a: Secondary | ICD-10-CM | POA: Insufficient documentation

## 2022-09-28 DIAGNOSIS — E43 Unspecified severe protein-calorie malnutrition: Secondary | ICD-10-CM

## 2022-09-28 LAB — CBC
HCT: 26.2 % — ABNORMAL LOW (ref 39.0–52.0)
Hemoglobin: 8.6 g/dL — ABNORMAL LOW (ref 13.0–17.0)
MCH: 33 pg (ref 26.0–34.0)
MCHC: 32.8 g/dL (ref 30.0–36.0)
MCV: 100.4 fL — ABNORMAL HIGH (ref 80.0–100.0)
Platelets: 401 10*3/uL — ABNORMAL HIGH (ref 150–400)
RBC: 2.61 MIL/uL — ABNORMAL LOW (ref 4.22–5.81)
RDW: 17.4 % — ABNORMAL HIGH (ref 11.5–15.5)
WBC: 6.9 10*3/uL (ref 4.0–10.5)
nRBC: 0 % (ref 0.0–0.2)

## 2022-09-28 LAB — BASIC METABOLIC PANEL
Anion gap: 9 (ref 5–15)
BUN: 38 mg/dL — ABNORMAL HIGH (ref 8–23)
CO2: 24 mmol/L (ref 22–32)
Calcium: 8.8 mg/dL — ABNORMAL LOW (ref 8.9–10.3)
Chloride: 103 mmol/L (ref 98–111)
Creatinine, Ser: 1.42 mg/dL — ABNORMAL HIGH (ref 0.61–1.24)
GFR, Estimated: 54 mL/min — ABNORMAL LOW (ref >60)
Glucose, Bld: 129 mg/dL — ABNORMAL HIGH (ref 70–99)
Potassium: 4.4 mmol/L (ref 3.5–5.1)
Sodium: 136 mmol/L (ref 135–145)

## 2022-09-28 NOTE — Progress Notes (Signed)
PROGRESS NOTE   Subjective/Complaints:  Ate 100% breakfast Reviewed labs, discussed need to consistently eat >50% meals to get feeding tube removed Also discussed with wife that fluids also need to be pushed   ROS: feels full from tube feeds, denies pain   Objective:   No results found. Recent Labs    09/25/22 1605 09/28/22 0638  WBC 9.2 6.9  HGB 8.9* 8.6*  HCT 26.9* 26.2*  PLT 317 401*   Recent Labs    09/27/22 0825 09/28/22 0638  NA 135 136  K 5.1 4.4  CL 106 103  CO2 21* 24  GLUCOSE 113* 129*  BUN 40* 38*  CREATININE 1.44* 1.42*  CALCIUM 8.9 8.8*    Intake/Output Summary (Last 24 hours) at 09/28/2022 1201 Last data filed at 09/28/2022 0800 Gross per 24 hour  Intake 598 ml  Output 1150 ml  Net -552 ml     Pressure Injury 09/15/22 Vertebral column Mid Stage 1 -  Intact skin with non-blanchable redness of a localized area usually over a bony prominence. stage 1 mid back (Active)  09/15/22 2130  Location: Vertebral column  Location Orientation: Mid  Staging: Stage 1 -  Intact skin with non-blanchable redness of a localized area usually over a bony prominence.  Wound Description (Comments): stage 1 mid back  Present on Admission: Yes     Pressure Injury 09/15/22 Buttocks Right;Left;Lower Deep Tissue Pressure Injury - Purple or maroon localized area of discolored intact skin or blood-filled blister due to damage of underlying soft tissue from pressure and/or shear. DTI sacrum (Active)  09/15/22 2130  Location: Buttocks  Location Orientation: Right;Left;Lower  Staging: Deep Tissue Pressure Injury - Purple or maroon localized area of discolored intact skin or blood-filled blister due to damage of underlying soft tissue from pressure and/or shear.  Wound Description (Comments): DTI sacrum  Present on Admission: Yes    Physical Exam: Vital Signs Blood pressure 105/69, pulse 99, temperature (!) 97.4 F  (36.3 C), resp. rate 19, SpO2 100%. Gen: no distress, normal appearing  General: No acute distress Mood and affect are appropriate Heart: Regular rate and rhythm no rubs murmurs or extra sounds Lungs: Clear to auscultation, breathing unlabored, no rales or wheezes Abdomen: Positive bowel sounds, soft nontender to palpation, nondistended Extremities: No clubbing, cyanosis, or edema Skin: No evidence of breakdown, no evidence of rash   Neurological:     Mental Status: He is alert and oriented to person, place, and time.     Comments: Alert and oriented to name, place, month, year, date, missed day by one. Reasonable insight and awareness. Mild dysphonia. He was able to follow simple commands without difficulty. Slow to process and easily distracted. RUE 4+/5. LUE 4+/5 prox to distal. RLE 4-/5 prox to 4/5 distally. LLE 3+/5 prox to 4/5 distally. No focal sensory findings. Normal muscle tone.  Ambulating CG with RW Psychiatric:     Comments: Pleasant, a little eccentric but cooperative   Assessment/Plan: 1. Functional deficits which require 3+ hours per day of interdisciplinary therapy in a comprehensive inpatient rehab setting. Physiatrist is providing close team supervision and 24 hour management of active medical problems listed below. Physiatrist and  rehab team continue to assess barriers to discharge/monitor patient progress toward functional and medical goals  Care Tool:  Bathing    Body parts bathed by patient: Right arm, Left arm, Chest, Abdomen, Front perineal area, Right upper leg, Left upper leg, Face   Body parts bathed by helper: Buttocks, Left lower leg, Right lower leg     Bathing assist Assist Level: Moderate Assistance - Patient 50 - 74%     Upper Body Dressing/Undressing Upper body dressing   What is the patient wearing?: Pull over shirt    Upper body assist Assist Level: Minimal Assistance - Patient > 75%    Lower Body Dressing/Undressing Lower body  dressing      What is the patient wearing?: Incontinence brief, Pants     Lower body assist Assist for lower body dressing: Moderate Assistance - Patient 50 - 74%     Toileting Toileting    Toileting assist Assist for toileting: Maximal Assistance - Patient 25 - 49%     Transfers Chair/bed transfer  Transfers assist     Chair/bed transfer assist level: Minimal Assistance - Patient > 75%     Locomotion Ambulation   Ambulation assist      Assist level: Contact Guard/Touching assist Assistive device: Walker-rolling Max distance: 180   Walk 10 feet activity   Assist     Assist level: Contact Guard/Touching assist Assistive device: Walker-rolling   Walk 50 feet activity   Assist Walk 50 feet with 2 turns activity did not occur: Safety/medical concerns (fatigue)  Assist level: Contact Guard/Touching assist Assistive device: Walker-rolling    Walk 150 feet activity   Assist Walk 150 feet activity did not occur: Safety/medical concerns  Assist level: Contact Guard/Touching assist Assistive device: Walker-rolling    Walk 10 feet on uneven surface  activity   Assist Walk 10 feet on uneven surfaces activity did not occur: Safety/medical concerns         Wheelchair     Assist Is the patient using a wheelchair?: Yes Type of Wheelchair: Manual    Wheelchair assist level: Maximal Assistance - Patient 25 - 49% Max wheelchair distance: 36ft    Wheelchair 50 feet with 2 turns activity    Assist        Assist Level: Moderate Assistance - Patient 50 - 74%   Wheelchair 150 feet activity     Assist      Assist Level: Total Assistance - Patient < 25%   Blood pressure 105/69, pulse 99, temperature (!) 97.4 F (36.3 C), resp. rate 19, SpO2 100%.   Medical Problem List and Plan: 1. 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  Antithrombotics: -DVT/anticoagulation:  Pharmaceutical: Lovenox             -antiplatelet therapy: ASA/ 3. Pain Management: Tylenol prn.  4. Mood/Behavior/Sleep: LCSW to follow for evaluation and support.              -antipsychotic agents: N/A             -wife is interested in neuropsych counseling for ?depression 5. Neuropsych/cognition: This patient is not fully capable of making decisions on his own behalf. 6. Skin/Wound Care: Air mattress for pressure relief and management of MASD/DTPI             --  tube feeds and Prostat for malnutrition.              --Vitamin C added.  7. Fluids/Electrolytes/Nutrition: Monitor I/O. Continue tube feeds at nights             --monitor for signs of overload.  8. Aspiration PNA: Sputum positive for Staph Lugdunensis. Augmentin thru 09/14 to complete 7 day course. 9. ETOH abuse w/withdrawal seizures: wife interested in ETOH counseling.              -discussed that majority of etoh counseling would be after his rehab stay 10. Acute on chronic anemia: Hgb has trended down form mid 8's to 6.9 on 9/12-->transfused with 1 unit PRBC             --will order FOBT and monitor for other signs of bleeding.  11.  Sever thiamine deficiency @ 1.1: Supplemented parentally X 5 days. Continue oral supplement.  12. H/o Chronic HFrEF w/recovery. S/p BIV ICD: Continues to have soft BP limiting GDMT.             --strict I/O. Daily wts to monitor for signs of overload.  Continue ASA/Statin.             --continue to hold Lisinopril and metoprolol. Monitor HR/for symptoms with increase with activity.  13. PAD s/p stent/aortobifemoral BG: On ASA and statin. Off Plavix due to low platelets/anemia.  14. Macrocytic anemia/ Low normal B12 @ 258: Started on supplement today. .   15. Dysphagia/chronic anorexia: Per family eats once a day.  Diet upgraded to regular             --continue on tube feeds at nights.  Ate 100% breakfast , will see what lunch  intake is today  Will hold TF at noc if meal intake >50% for lunch and dinner  16. CKD?: SCr- 2.16 @ admission. Hyperkalemia today treated with lokelma --will change tube feeds to nephro due to worsening of renal status-    Latest Ref Rng & Units 09/28/2022    6:38 AM 09/27/2022    8:25 AM 09/26/2022   12:28 PM  BMP  Glucose 70 - 99 mg/dL 161  096  045   BUN 8 - 23 mg/dL 38  40  38   Creatinine 0.61 - 1.24 mg/dL 4.09  8.11  9.14   Sodium 135 - 145 mmol/L 136  135  135   Potassium 3.5 - 5.1 mmol/L 4.4  5.1  5.2   Chloride 98 - 111 mmol/L 103  106  104   CO2 22 - 32 mmol/L 24  21  20    Calcium 8.9 - 10.3 mg/dL 8.8  8.9  9.0    --on water flushes 300 cc/QID. Strict I/O.   From 07/01/21 BUN 12 and Creat 1.59 looks to bee at baseline   17. Magnesium deficiency: supplement 2grams IV today  18. Phosphorus deficiency: repeat today, will not replete with kphos while potassium is elevated  19. Hyperkalemia: lokelma ordered previously,Resolved  20. Vitamin A deficiency: will hold off on supplementation as pills are uncomfortably large for patient to swallow  21. Tachycardia: 2 grams IV mag ordered 9/15  22. Early satiety: 2/2 TF, discussed decreasing TF to HS and he and family are agreeable, discussed stopping TF once he eats 50% meals      LOS: 4 days A FACE TO FACE EVALUATION WAS PERFORMED  Erick Colace 09/28/2022, 12:01 PM

## 2022-09-28 NOTE — Progress Notes (Signed)
Speech Language Pathology Daily Session Note  Patient Details  Name: Derek Oconnell MRN: [id number] Date of Birth: 04/11/1955  Today's Date: 09/28/2022 SLP Individual Time: 1914-7829 SLP Individual Time Calculation (min): 42 min  Short Term Goals: Week 1: SLP Short Term Goal 1 (Week 1): STG = LTG due to ELOS  Skilled Therapeutic Interventions: SLP conducted skilled therapy session targeting cognitive retraining goals. Patient disoriented to month requiring placement of external calendar aid to orient to date. Patient oriented to day of the week independently. Patient reports that he can tell he is having difficulty with short term memory. SLP guided patient through task to manage schedule with patient utilizing room schedule to discuss the day's events with supervisionA. During mildly complex problem solving task, patient identified and fixed errors with minA. SLP introduced patient to [facility] memory strategies and guided patient through repetition of strategies to promote recall. Provided patient with handout outlining discussed topics. SLP and patient discussed current football game schedule of patient's preferred team with patient recalling previous and upcoming [employer] games with 75% accuracy given supervisionA. Patient was left in chair with call bell in reach and chair alarm set. SLP will continue to target goals per plan of care.      Pain Pain Assessment Pain Scale: 0-10 Pain Score: 0-No pain  Therapy/Group: Individual Therapy  Jeannie Done, M.A., CCC-SLP  Derek Oconnell 09/28/2022, 11:35 AM

## 2022-09-28 NOTE — Progress Notes (Signed)
Occupational Therapy Session Note  Patient Details  Name: Derek Oconnell MRN: [id number] Date of Birth: Jul 19, 1955  Today's Date: 09/28/2022 OT Individual Time: 1105-1200 OT Individual Time Calculation (min): 55 min    Short Term Goals: Week 1:  OT Short Term Goal 1 (Week 1): STG=LTG due to LOS  Skilled Therapeutic Interventions/Progress Updates:    Patient received sitting in recliner, wife at bedside.  Patient indicated need to void when asked, then noticed Depend was damp, and he immediately moved to change pants and put on clean depend.  Patient reports feeling fatigued, but able to complete with supervision.  Patient walked out of bathroom to wash hands - walking with RW and close supervision, contact guard during turns or direction changes.  Patient stood to complete oral care.  Patient transported to ADL apartment to practice tub shower transfers with tub seat. Patient able to step over tub to get to seat with supervision.  Shared info with wife who is anticipating discharge at end of week.  Reviewed with patient what he needs to focus on to have NG tube removed.  Patient much more engaged this session.    Therapy Documentation Precautions:  Precautions Precautions: Fall Precaution Comments: cortrak, ETOH abuse, watch HR Restrictions Weight Bearing Restrictions: No  Pain: Pain Assessment Pain Scale: 0-10 Pain Score: 0-No pain     Therapy/Group: Individual Therapy  [name] 09/28/2022, 12:44 PM

## 2022-09-28 NOTE — Progress Notes (Signed)
Physical Therapy Session Note  Patient Details  Name: Derek Oconnell MRN: [id number] Date of Birth: 06/17/1955  Today's Date: 09/28/2022 PT Individual Time: 5643-3295 + 1445 - 1525 PT Individual Time Calculation (min): 41 min  + 40 min  Short Term Goals: Week 1:  PT Short Term Goal 1 (Week 1): STG = LTG  Skilled Therapeutic Interventions/Progress Updates:      1st session: Pt lying in bed to start - agreeable to therapy session. No reports of pain; has flat affect during treatment - worked on Journalist, newspaper and improving engagement from patient.   Pt donned pants at bed level with setupA - able to bridge hips to pull pants up. Supine<>sitting EOB with supervision on air mattress hospital bed. Stand pivot transfer with no AD and CGA from EOB into w/c. Transported w/c outdoors to work on functional gait in outdoor setting. Sit<>stand from w/c to RW with CGA with cues for hand placement only. Ambulated ~145ft outdoors at [facility] level using the RW - improved postural control and upright tolerance compared to Friday's PT session. Challenged gait with no AD ~169ft + ~143ft with light minA due to increased lateral sway L/R with delayed righting reactions. Terrain unlevel but paved and pt somewhat distractable for unfamiliar environment.  Returned upstairs to his room - assisted back to recliner with ambulatory minA transfer. Safety belt alarm on and call bell within reach. Pt thankful for opportunity to be outside during therapy session.   2nd session: Pt sitting in recliner on arrival - agreeable to PT. No c/o pain but asks to use the bathroom before leaving his room. Sit<>Stand to RW with supervision assist. Ambulates with close supervision to intermittent CGA with RW in his room to the bathroom. Pt sits on the toilet to urinate, continent of bladder void. Ambulated with close supervision and RW from his room to main rehab gym, ~157ft, without cues only for keeping body within walker frame and  maintaining forward gaze with upright posture. Stair training completed with 6" steps and 1 hand rail on R to simulate home entrance - pt requires CGA for safety while completing these forward facing with step-to pattern. Ambulated to day room rehab gym, ~111ft, with close supervision and RW with similar cues as above. Assisted on Nustep and completed 3 min at L5 resistance using BUE/BLE to challenge whole body strengthening and endurance. Ambulated back to his room, ~113ft, with supervision and RW with directional cues needed to locate room. Session concluded with pt sitting in recliner, seat belt alarm on, call bell in lap.   Therapy Documentation Precautions:  Precautions Precautions: Fall Precaution Comments: cortrak, ETOH abuse, watch HR Restrictions Weight Bearing Restrictions: No General:      Therapy/Group: Individual Therapy  [name] 09/28/2022, 7:40 AM

## 2022-09-29 LAB — MAGNESIUM: Magnesium: 2 mg/dL (ref 1.7–2.4)

## 2022-09-29 LAB — BASIC METABOLIC PANEL
Anion gap: 18 — ABNORMAL HIGH (ref 5–15)
BUN: 40 mg/dL — ABNORMAL HIGH (ref 8–23)
CO2: 24 mmol/L (ref 22–32)
Calcium: 9.4 mg/dL (ref 8.9–10.3)
Chloride: 99 mmol/L (ref 98–111)
Creatinine, Ser: 1.39 mg/dL — ABNORMAL HIGH (ref 0.61–1.24)
GFR, Estimated: 56 mL/min — ABNORMAL LOW (ref >60)
Glucose, Bld: 128 mg/dL — ABNORMAL HIGH (ref 70–99)
Potassium: 4.5 mmol/L (ref 3.5–5.1)
Sodium: 141 mmol/L (ref 135–145)

## 2022-09-29 LAB — PHOSPHORUS: Phosphorus: 3.6 mg/dL (ref 2.5–4.6)

## 2022-09-29 MED ORDER — ENSURE ENLIVE PO LIQD
237.0000 mL | Freq: Three times a day (TID) | ORAL | Status: DC
  Administered 2022-09-29 – 2022-10-01 (×6): 237 mL via ORAL

## 2022-09-29 MED ORDER — POLYETHYLENE GLYCOL 3350 17 G PO PACK: 17.0000 g | PACK | Freq: Every day | ORAL | Status: DC | PRN

## 2022-09-29 MED ORDER — ENSURE MAX PROTEIN PO LIQD
11.0000 [oz_av] | Freq: Every day | ORAL | Status: DC
  Administered 2022-09-29: 11 [oz_av] via ORAL

## 2022-09-29 MED ORDER — JUVEN PO PACK: 1.0000 | PACK | Freq: Two times a day (BID) | ORAL | Status: DC

## 2022-09-29 NOTE — Progress Notes (Signed)
Calorie Count Note  72 hour calorie count ordered. Results below:  Diet: Regular with thin liquids Supplements: Magic cup BID with lunch and dinner, each supplement provides 290 kcal and 9 grams of protein; previously also ordered for Juven BID but now discontinued as pt did not like these supplements  When RD went to room to collect calorie count receipts to calculate results, there was no envelope hanging and no calorie count receipts available. Discussed with RN who reports there was an envelope there on Sunday. Envelope must have been discarded at some point. Will attempt to calculate the previous two days from documented meal percentages in chart, but this is not as accurate as a true calorie count as unable to truly tell which parts of the meal pt ate. Of note, team has stopped tube feeds and there is an order to remove Cortrak tube today.  Day 1: Breakfast 9/15: 193 kcal, 2.5 grams of protein Lunch 9/15: 107 kcal, 6 grams of protein Dinner 9/15: 80 kcal, 4.5 grams of protein Supplements: N/A - unable to determine from documentation  Total intake Day 1: 380 kcal (21% of minimum estimated needs)  13 grams of protein (14% of minimum estimated needs)  Day 2: Breakfast 9/16: 269 kcal, 9 grams of protein Lunch 9/16: 173 kcal, 6 grams of protein Dinner 9/16: 370 kcal, 11 grams of protein Supplements: N/A - unable to determine from documentation  Total intake Day 2: 812 kcal (45% of minimum estimated needs)  26 grams of protein (29% of minimum estimated needs)  Estimated Nutritional Needs:  Kcal:  1800-2000 Protein:  90-110g Fluid:  1.8L/day  Nutrition Dx: Severe Malnutrition related to social / environmental circumstances as evidenced by severe fat depletion, severe muscle depletion.   Goal: Patient will meet greater than or equal to 90% of their needs   Intervention:  -Will discontinue order for calorie count. -Please see full follow-up note for updated nutrition  intervention.  Letta Median, MS, RD, LDN, CNSC Pager number available on Amion

## 2022-09-29 NOTE — Progress Notes (Signed)
PROGRESS NOTE   Subjective/Complaints: No new complaints this morning Ate 100% of meals yesterday, placed order for d/c cortrak Discussed team meeting tomorrow  ROS: feels full from tube feeds, denies pain, +constipation   Objective:   No results found. Recent Labs    09/28/22 0638  WBC 6.9  HGB 8.6*  HCT 26.2*  PLT 401*   Recent Labs    09/27/22 0825 09/28/22 0638  NA 135 136  K 5.1 4.4  CL 106 103  CO2 21* 24  GLUCOSE 113* 129*  BUN 40* 38*  CREATININE 1.44* 1.42*  CALCIUM 8.9 8.8*    Intake/Output Summary (Last 24 hours) at 09/29/2022 1038 Last data filed at 09/29/2022 0902 Gross per 24 hour  Intake 960 ml  Output --  Net 960 ml     Pressure Injury 09/15/22 Vertebral column Mid Stage 1 -  Intact skin with non-blanchable redness of a localized area usually over a bony prominence. stage 1 mid back (Active)  09/15/22 2130  Location: Vertebral column  Location Orientation: Mid  Staging: Stage 1 -  Intact skin with non-blanchable redness of a localized area usually over a bony prominence.  Wound Description (Comments): stage 1 mid back  Present on Admission: Yes     Pressure Injury 09/15/22 Buttocks Right;Left;Lower Deep Tissue Pressure Injury - Purple or maroon localized area of discolored intact skin or blood-filled blister due to damage of underlying soft tissue from pressure and/or shear. DTI sacrum (Active)  09/15/22 2130  Location: Buttocks  Location Orientation: Right;Left;Lower  Staging: Deep Tissue Pressure Injury - Purple or maroon localized area of discolored intact skin or blood-filled blister due to damage of underlying soft tissue from pressure and/or shear.  Wound Description (Comments): DTI sacrum  Present on Admission: Yes    Physical Exam: Vital Signs Blood pressure 103/71, pulse 98, temperature 98.3 F (36.8 C), resp. rate 18, SpO2 100%. Gen: no distress, normal appearing  Gen:  no distress, normal appearing HEENT: oral mucosa pink and moist, NCAT Cardio: Reg rate Chest: normal effort, normal rate of breathing Abd: soft, non-distended Ext: no edema Psych: pleasant, normal affect  Neurological:     Mental Status: He is alert and oriented to person, place, and time.     Comments: Alert and oriented to name, place, month, year, date, missed day by one. Reasonable insight and awareness. Mild dysphonia. He was able to follow simple commands without difficulty. Slow to process and easily distracted. RUE 4+/5. LUE 4+/5 prox to distal. RLE 4-/5 prox to 4/5 distally. LLE 3+/5 prox to 4/5 distally. No focal sensory findings. Normal muscle tone.  Ambulating CG with RW Psychiatric:     Comments: Pleasant, a little eccentric but cooperative   Assessment/Plan: 1. Functional deficits which require 3+ hours per day of interdisciplinary therapy in a comprehensive inpatient rehab setting. Physiatrist is providing close team supervision and 24 hour management of active medical problems listed below. Physiatrist and rehab team continue to assess barriers to discharge/monitor patient progress toward functional and medical goals  Care Tool:  Bathing    Body parts bathed by patient: Right arm, Left arm, Chest, Abdomen, Front perineal area, Right upper leg, Left  upper leg, Face   Body parts bathed by helper: Buttocks, Left lower leg, Right lower leg     Bathing assist Assist Level: Moderate Assistance - Patient 50 - 74%     Upper Body Dressing/Undressing Upper body dressing   What is the patient wearing?: Pull over shirt    Upper body assist Assist Level: Minimal Assistance - Patient > 75%    Lower Body Dressing/Undressing Lower body dressing      What is the patient wearing?: Incontinence brief, Pants     Lower body assist Assist for lower body dressing: Moderate Assistance - Patient 50 - 74%     Toileting Toileting    Toileting assist Assist for toileting:  Maximal Assistance - Patient 25 - 49%     Transfers Chair/bed transfer  Transfers assist     Chair/bed transfer assist level: Minimal Assistance - Patient > 75%     Locomotion Ambulation   Ambulation assist      Assist level: Contact Guard/Touching assist Assistive device: Walker-rolling Max distance: 180   Walk 10 feet activity   Assist     Assist level: Contact Guard/Touching assist Assistive device: Walker-rolling   Walk 50 feet activity   Assist Walk 50 feet with 2 turns activity did not occur: Safety/medical concerns (fatigue)  Assist level: Contact Guard/Touching assist Assistive device: Walker-rolling    Walk 150 feet activity   Assist Walk 150 feet activity did not occur: Safety/medical concerns  Assist level: Contact Guard/Touching assist Assistive device: Walker-rolling    Walk 10 feet on uneven surface  activity   Assist Walk 10 feet on uneven surfaces activity did not occur: Safety/medical concerns         Wheelchair     Assist Is the patient using a wheelchair?: Yes Type of Wheelchair: Manual    Wheelchair assist level: Maximal Assistance - Patient 25 - 49% Max wheelchair distance: 4ft    Wheelchair 50 feet with 2 turns activity    Assist        Assist Level: Moderate Assistance - Patient 50 - 74%   Wheelchair 150 feet activity     Assist      Assist Level: Total Assistance - Patient < 25%   Blood pressure 103/71, pulse 98, temperature 98.3 F (36.8 C), resp. rate 18, SpO2 100%.   Medical Problem List and Plan: 1. 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  Antithrombotics: -DVT/anticoagulation:  Pharmaceutical: Lovenox             -antiplatelet therapy: ASA/ 3. Pain Management: Tylenol prn.  4. Mood/Behavior/Sleep: LCSW to follow for evaluation and support.               -antipsychotic agents: N/A             -wife is interested in neuropsych counseling for ?depression 5. Neuropsych/cognition: This patient is not fully capable of making decisions on his own behalf. 6. Skin/Wound Care: Air mattress for pressure relief and management of MASD/DTPI             --tube feeds and Prostat for malnutrition.              --Vitamin C added.  7. Fluids/Electrolytes/Nutrition: Monitor I/O. Continue tube feeds at nights             --  monitor for signs of overload.  8. Aspiration PNA: Sputum positive for Staph Lugdunensis. Augmentin thru 09/14 to complete 7 day course. 9. ETOH abuse w/withdrawal seizures: wife interested in ETOH counseling.              -discussed that majority of etoh counseling would be after his rehab stay 10. Acute on chronic anemia: Hgb has trended down form mid 8's to 6.9 on 9/12-->transfused with 1 unit PRBC             --will order FOBT and monitor for other signs of bleeding.  11.  Sever thiamine deficiency @ 1.1: Supplemented parentally X 5 days. Continue oral supplement.  12. H/o Chronic HFrEF w/recovery. S/p BIV ICD: Continues to have soft BP limiting GDMT.             --strict I/O. Daily wts to monitor for signs of overload.  Continue ASA/Statin.             --continue to hold Lisinopril and metoprolol. Monitor HR/for symptoms with increase with activity.  13. PAD s/p stent/aortobifemoral BG: On ASA and statin. Off Plavix due to low platelets/anemia.  14. Macrocytic anemia/ Low normal B12 @ 258: Started on supplement today. .   15. Dysphagia/chronic anorexia: Per family eats once a day.  Diet upgraded to regular             --continue on tube feeds at nights.  Ate 100% breakfast , will see what lunch intake is today  Will hold TF at noc if meal intake >50% for lunch and dinner  16. CKD?: SCr- 2.16 @ admission. Hyperkalemia today treated with lokelma --will change tube feeds to nephro due to worsening of renal status-    Latest Ref Rng & Units  09/28/2022    6:38 AM 09/27/2022    8:25 AM 09/26/2022   12:28 PM  BMP  Glucose 70 - 99 mg/dL 098  119  147   BUN 8 - 23 mg/dL 38  40  38   Creatinine 0.61 - 1.24 mg/dL 8.29  5.62  1.30   Sodium 135 - 145 mmol/L 136  135  135   Potassium 3.5 - 5.1 mmol/L 4.4  5.1  5.2   Chloride 98 - 111 mmol/L 103  106  104   CO2 22 - 32 mmol/L 24  21  20    Calcium 8.9 - 10.3 mg/dL 8.8  8.9  9.0    --on water flushes 300 cc/QID. Strict I/O.   From 07/01/21 BUN 12 and Creat 1.59 looks to bee at baseline   17. Underweight: BMI reviewed and is 18.16  18. Phosphorus deficiency: repeat today, will not replete with kphos while potassium is elevated, check phosphorus today  19. Hyperkalemia: lokelma ordered previously,Resolved, check potassium today.   20. Vitamin A deficiency: will hold off on supplementation as pills are uncomfortably large for patient to swallow  21. Tachycardia: 2 grams IV mag ordered 9/15, check magnesium level today  22. Early satiety: 2/2 TF, d/c TF      LOS: 5 days A FACE TO FACE EVALUATION WAS PERFORMED  Drema Pry Amai Cappiello 09/29/2022, 10:38 AM

## 2022-09-29 NOTE — Progress Notes (Signed)
Physical Therapy Session Note  Patient Details  Name: Derek Oconnell MRN: [id number] Date of Birth: 09-02-1955  Today's Date: 09/29/2022 PT Individual Time: 1015-1115 PT Individual Time Calculation (min): 60 min  and Today's Date: 09/29/2022 PT Missed Time: 15 Minutes Missed Time Reason: Patient fatigue  Short Term Goals: Week 1:  PT Short Term Goal 1 (Week 1): STG = LTG  Skilled Therapeutic Interventions/Progress Updates:      Pt sitting in recliner to start - agreeable to therapy session. No reports of pain. Continues to have flat affect but this is possibly his baseline. We discussed DC planning as patient is approaching goal level of supervision - discussed main barrier will be cortrack and diet - pt understandable. Also discussed DME rec's - pt reports he has a rollator at home and he prefers that over the RW.   Sit<>Stand to RW with supervision from recliner height. Ambulates at close supervision level using RW from his room to main rehab gym, ~122ft. Cues for keeping body within walker frame and maintain upright posture - pt voices understanding but doesn't correct. Likely baseline mobility for posture.   Substituted RW for rollator as this is what patient is familiar with and has at home. Adjusted rollator to fit and briefly educated on safety and brake locking. Sit<>stand to rollator at supervision level and ambulates >266ft with supervision and rollator. Fair safety awareness and ability to manage AD through rehab spaces, pt reports he prefers this over the RW.   Donned 4# ankle weights bilaterally - pt resistant to added weight but agreeable with encouragement. Ambulated 81ft with supervision and rollator with added weight for strengthening and endurance training. Stopped in ADL apartment to work on Water quality scientist and home safety. Removed 4# ankle weights for this as patient adament on taking them off.   Mat table there-ex completed as follows: -1x9 SLR bilaterally -1x10 heel  slides bilaterally -1x10 hip abd bilaterally -2x15 ankle pumps bilaterally -1x10 glut sets -1x10 quad sets bilaterally  Pt requesting to return to his room due to fatigue. Ambulated back to his room at supervision level using his rollator. Session concluded with patient seated in recliner, seat belt alarm on, all needs met. Missed the last 15 minutes of therapy due to fatigue.    Therapy Documentation Precautions:  Precautions Precautions: Fall Precaution Comments: cortrak, ETOH abuse, watch HR Restrictions Weight Bearing Restrictions: No General:    Therapy/Group: Individual Therapy  [name] 09/29/2022, 7:41 AM

## 2022-09-29 NOTE — Progress Notes (Signed)
Cotrak removed per MD order. Pt tolerated well. PA confirmed order in place. Pt tolerated well, pt sitting up in chair eating dinner. No needs at this time.

## 2022-09-29 NOTE — Progress Notes (Addendum)
Occupational Therapy Session Note  Patient Details  Name: Derek Oconnell MRN: [id number] Date of Birth: [birth year]  Today's Date: 09/29/2022 OT Individual Time: 8469-6295 session 1 OT Individual Time Calculation (min): 34 min  Session 2: 2841-3244   Short Term Goals: Week 1:  OT Short Term Goal 1 (Week 1): STG=LTG due to LOS  Skilled Therapeutic Interventions/Progress Updates:  Session 1: Pt greeted supine in bed, pt agreeable to OT intervention. Pt donned pants from supine per pt preference with set- up assist. Supine>sit with supervision. Pt completed ~ 5 ft of functional mobility to w/c with Rw and CGA nearing supervision, cues needed for Rw mgmt.   Total A transport to apt where pt completed ambulatory transfers to couch and recliner with Rw and CGA, able to navigate around obstacles such as w/c with MIN cues for safety. Pt reports he sleeps on the couch so no need to practice bed transfer. Pt reports his wife does all the cooking.   Pt completed 3 mins x2 on scifit to improve global strengthening and endurance. Pt needed rest break in between sets.   HR 113 bpm  O2 100% on RA  Ended session with pt seated in recliner with alarm belt activated and all needs within reach.   Session 2: pt greeted seated in recliner, asleep but easily able to arouse and agreeable to session. Noted pants wet, pt reports prior BM. Pt sat on BSC to change pants but then reports need to void bowels. Stand pivot to toilet with CGA with use of grab bars. Pt required + time on toilet for BM. Donned new pants and brief with set- up assist.   Pt required seated rest break after toileting d/t fatigue. Remainder of session focused on BUE therex with 3lb dowel rod, pt completed below therex with CGA X10 standing chest presses X10 standig OH presses X10 standing bicep curls X10 marches while holding dowel at chest  Ended session with pt seated in recliner with all needs within reach and chair alarm activated.   Therapy Documentation Precautions:  Precautions Precautions: Fall Precaution Comments: cortrak, ETOH abuse, watch HR Restrictions Weight Bearing Restrictions: No    Pain: No pain reported during either session    Therapy/Group: Individual Therapy  Pollyann Glen [facility] 09/29/2022, 12:15 PM

## 2022-09-29 NOTE — Progress Notes (Signed)
Speech Language Pathology Daily Session Note  Patient Details  Name: Derek Oconnell MRN: [id number] Date of Birth: 11-16-1955  Today's Date: 09/29/2022 SLP Individual Time: 0800-0900 SLP Individual Time Calculation (min): 60 min  Short Term Goals: Week 1: SLP Short Term Goal 1 (Week 1): STG = LTG due to ELOS  Skilled Therapeutic Interventions: SLP conducted skilled therapy session targeting cognitive retraining goals. Patient greeted partially reclined in bed having just finished breakfast. Patient reports no difficulty with meal and no pain. SLP facilitated mildly complex problem solving task with patient requiring extra processing time and supervision verbal cues to complete accurately. Suspect error x1 was d/t vision and not inaccurate response. Patient utilized provided daily schedule to report scheduled sessions for the day to therapist with modI and was oriented to date, time, location, and situation with modI. During organization task, patient demonstrated emergent awareness of mistakes given min-mod verbal and visual cues. Patient was left in lowered bed with call bell in reach and bed alarm set. SLP will continue to target goals per plan of care.      Pain Pain Assessment Pain Scale: 0-10 Pain Score: 0-No pain  Therapy/Group: Individual Therapy  Jeannie Done, M.A., CCC-SLP  Yetta Barre 09/29/2022, 8:33 AM

## 2022-09-29 NOTE — Progress Notes (Addendum)
Nutrition Follow-up  DOCUMENTATION CODES:   Severe malnutrition in context of social or environmental circumstances  INTERVENTION:  Provide Ensure Enlive po TID, each supplement provides 350 kcal and 20 grams of protein.  Provide Magic cup TID with meals, each supplement provides 290 kcal and 9 grams of protein.  Provided "High Calorie, High Protein Nutrition Therapy" and "High Protein Food List" handouts from the Academy of Nutrition and Dietetics. Discussed strategies for increasing intake of calories and protein at meals based on foods pt enjoys. Reviewed menu at bedside with pt and discussed which foods available on menu are good sources of protein. Encouraged pt to choose a good source of protein at meals.  Continue multivitamin with minerals po daily, thiamine 100 mg daily, folic acid 1 mg daily.  Continue micronutrient supplementation currently ordered: -Vitamin B6 100 mg daily x 30 days -Zinc sulfate 220 mg daily x 14 days  Once plan to re-attempt vitamin A supplementation, recommend providing vitamin A 10,000 international units daily po x 2 weeks.  Measure weights once weekly.  NUTRITION DIAGNOSIS:   Severe Malnutrition related to social / environmental circumstances as evidenced by severe fat depletion, severe muscle depletion.  Ongoing - addressing with interventions.  GOAL:   Patient will meet greater than or equal to 90% of their needs  Not met at this time with PO intake alone. Addressing with interventions.  MONITOR:   PO intake, Supplement acceptance, Labs, Weight trends, I & O's, TF tolerance  REASON FOR ASSESSMENT:   Consult Calorie Count  ASSESSMENT:   Pt with PMH of alcohol abuse, CHF with ICH, and COPD admitted from home after witnessed seizure. Admitted to [facility] tx to [facility] for LTM. Discharged 9/12 to CIR.  9/3: intubated; tx to [facility] for LTM 9/4: s/p cortrak placement; tip proximal stomach  9/5: extubated 9/10: s/p MBS and diet advanced to dysphagia  1 with thin liquids 9/11: diet advanced to dysphagia 2 with thin liquids by SLP 9/12: admitted to CIR; diet advanced to regular with thin liquids by SLP 9/14: tube feeds stopped per review of chart  Met with pt at bedside. He reports his appetite is improving. Pt has been ordering meals low in calories in protein. On first day of calorie count met <25% kcal needs. On second day of calorie count met <50% kcal needs. Cortrak tube still in place at this time, but order in place to remove tube today. It appears tube feeds were stopped on 9/14. RD reached out to MD via secure chat with recommendation to leave tube in place and provide nocturnal tube feeds to help meet calorie/protein needs while working on improving PO intake. Per MD plan is to not provide nocturnal tube feeds. Will try to maximize intake from meals and supplements. Provided handouts on increasing intake of calories and protein to pt. Reviewed menu and discussed which foods are good sources of protein. Encouraged pt to choose a good source of protein at meals. He is also amenable to having Ensure Enlive and Magic Cup supplements to help meet estimated needs. Juven supplements were discontinued as pt does not like these and unable to drink.  Per review of chart pt was 57.4 kg on 09/24/22. No subsequent weight available in chart since then. Recommend measuring weight at least once weekly to trend.  Enteral Access: 10 Fr. Cortrak tube placed 09/16/22 left nare; terminates in proximal stomach per abdominal x-ray 9/4 Order in place for this to be removed today  Medications reviewed and include: vitamin  C 500 mg daily, Vitamin B12 1000 micrograms daily [place] from 9/12-9/18 (ordered by MD), folic acid 1 mg daily, free water 300 mL every 6 hours, Remeron 30 mg at bedtime, MVI daily, pantoprazole, Ensure Max, vitamin B6 100 mg daily x 30 days, thiamine 100 mg daily, zinc sulfate 220 mg daily x 14 days Per review of chart vitamin A repletion on hold as pt  couldn't tolerate capsule  Labs reviewed: On 9/16 BUN 38, Creatinine 1.42  Micronutrient Profile: Zinc: 33 L 09/17/22 Vitamin B6: 1.1 L 09/17/22 Vitamin A: 12.6 L 09/16/22  UOP: 401 x unmeasured UOP yesterday, but suspect this was not documented correctly  I/O: +118 mL since admission  Discussed with RN.  Diet Order:   Diet Order             Diet regular Fluid consistency: Thin  Diet effective 1000                  EDUCATION NEEDS:   No education needs have been identified at this time  Skin:  Skin Integrity Issues:: Stage I, DTI DTI: sacrum Stage I: vertebral column  Last BM:  9/13 -type 7  Height:   Ht Readings from Last 1 Encounters:  09/15/22 5\' 10"  (1.778 m)   Weight:   Wt Readings from Last 1 Encounters:  09/24/22 57.4 kg   BMI:  There is no height or weight on file to calculate BMI.  Estimated Nutritional Needs:   Kcal:  1800-2000  Protein:  90-110g  Fluid:  1.8L/day  Letta Median, MS, RD, LDN, CNSC Pager number available on Amion

## 2022-09-30 NOTE — Progress Notes (Signed)
Occupational Therapy Session Note  Patient Details  Name: Dari Kamran MRN: [id number] Date of Birth: Oct 04, 1955  Today's Date: 09/30/2022 OT Individual Time: 1300-1355 OT Individual Time Calculation (min): 55 min    Short Term Goals: Week 1:  OT Short Term Goal 1 (Week 1): STG=LTG due to LOS  Skilled Therapeutic Interventions/Progress Updates:    Patient received supine in bed.  Encouraged to shower by therapist and wife.  Patient agreed to shower and picked out clean clothing.  Walked to shower with 4 wheeled walker and supervision.  Able to remove and dress himself without physical assistance.  Patient with bloody points on left knee - he wrapped himself in curlex to absorb blood.  Patient indicates nurse has seen knee -and bleeding is due to scratching dry skin.  Declines lotion.   Left up in bed with personal items in reach and wife at bedside.    Therapy Documentation Precautions:  Precautions Precautions: Fall Precaution Comments: ETOH abuse, watch HR Restrictions Weight Bearing Restrictions: No   Pain:  No pain     Therapy/Group: Individual Therapy  [name] 09/30/2022, 1:59 PM

## 2022-09-30 NOTE — Progress Notes (Signed)
Physical Therapy Session Note  Patient Details  Name: Derek Oconnell MRN: [id number] Date of Birth: 1955/09/08  Today's Date: 09/30/2022 PT Individual Time: 0801-0845 PT Individual Time Calculation (min): 44 min   Short Term Goals: Week 1:  PT Short Term Goal 1 (Week 1): STG = LTG  Skilled Therapeutic Interventions/Progress Updates:      Pt resting in bed to start. No longer has cortrack which he's thankful for. Patient denies pain, continues to have flat affect and is apathetic towards therapies.   Supine<>sitting EOB without assist with HOB slightly elevated. Donned slip on shoes with setupA and completes sit<>Stand to rollator with supervision. Pt ambulates with rollator at supervision level from his room to day room rehab gym, ~137ft. Improved stability with turns.   Standing weight obtained per nursing request - 124lb. Documented in flowsheets.   Pt instructed in using his rollator. He covered 621ft total in 6 minutes without a seated or standing rest break. Gait speed 0.58 m/s from the 6 minute test and age/sex norm = 1,854ft, suggestive of impaired activity tolerance and limited endurance.   Pt reporting urinary incontinence with wet brief and soiled pants - requesting to return to his room to change. In room, he completed LB dressing with supervision.   Session concluded with patient sitting in recliner, safety belt alarm on, call bell in reach.  Therapy Documentation Precautions:  Precautions Precautions: Fall Precaution Comments: cortrak, ETOH abuse, watch HR Restrictions Weight Bearing Restrictions: No General:      Therapy/Group: Individual Therapy  [name] 09/30/2022, 7:55 AM

## 2022-09-30 NOTE — Progress Notes (Signed)
PROGRESS NOTE   Subjective/Complaints: No new complaints this morning Working with SLP Plan for d/c Friday AKI improved  ROS: feels full from tube feeds, denies pain, +constipation   Objective:   No results found. Recent Labs    09/28/22 0638  WBC 6.9  HGB 8.6*  HCT 26.2*  PLT 401*   Recent Labs    09/28/22 0638 09/29/22 1321  NA 136 141  K 4.4 4.5  CL 103 99  CO2 24 24  GLUCOSE 129* 128*  BUN 38* 40*  CREATININE 1.42* 1.39*  CALCIUM 8.8* 9.4    Intake/Output Summary (Last 24 hours) at 09/30/2022 1112 Last data filed at 09/30/2022 1610 Gross per 24 hour  Intake 598 ml  Output 800 ml  Net -202 ml     Pressure Injury 09/15/22 Vertebral column Mid Stage 1 -  Intact skin with non-blanchable redness of a localized area usually over a bony prominence. stage 1 mid back (Active)  09/15/22 2130  Location: Vertebral column  Location Orientation: Mid  Staging: Stage 1 -  Intact skin with non-blanchable redness of a localized area usually over a bony prominence.  Wound Description (Comments): stage 1 mid back  Present on Admission: Yes     Pressure Injury 09/15/22 Buttocks Right;Left;Lower Deep Tissue Pressure Injury - Purple or maroon localized area of discolored intact skin or blood-filled blister due to damage of underlying soft tissue from pressure and/or shear. DTI sacrum (Active)  09/15/22 2130  Location: Buttocks  Location Orientation: Right;Left;Lower  Staging: Deep Tissue Pressure Injury - Purple or maroon localized area of discolored intact skin or blood-filled blister due to damage of underlying soft tissue from pressure and/or shear.  Wound Description (Comments): DTI sacrum  Present on Admission: Yes    Physical Exam: Vital Signs Blood pressure 113/73, pulse 87, temperature (!) 97.4 F (36.3 C), resp. rate 18, weight 56.2 kg, SpO2 96%. Gen: no distress, normal appearing HEENT: oral mucosa pink  and moist, NCAT Cardio: Reg rate Chest: normal effort, normal rate of breathing Abd: soft, non-distended Ext: no edema Psych: pleasant, flat affect  Neurological:     Mental Status: He is alert and oriented to person, place, and time.     Comments: Alert and oriented to name, place, month, year, date, missed day by one. Reasonable insight and awareness. Mild dysphonia. He was able to follow simple commands without difficulty. Slow to process and easily distracted. RUE 4+/5. LUE 4+/5 prox to distal. RLE 4-/5 prox to 4/5 distally. LLE 3+/5 prox to 4/5 distally. No focal sensory findings. Normal muscle tone.  Ambulating CG with RW Psychiatric:     Comments: Pleasant, a little eccentric but cooperative   Assessment/Plan: 1. Functional deficits which require 3+ hours per day of interdisciplinary therapy in a comprehensive inpatient rehab setting. Physiatrist is providing close team supervision and 24 hour management of active medical problems listed below. Physiatrist and rehab team continue to assess barriers to discharge/monitor patient progress toward functional and medical goals  Care Tool:  Bathing    Body parts bathed by patient: Right arm, Left arm, Chest, Abdomen, Front perineal area, Right upper leg, Left upper leg, Face   Body  parts bathed by helper: Buttocks, Left lower leg, Right lower leg     Bathing assist Assist Level: Moderate Assistance - Patient 50 - 74%     Upper Body Dressing/Undressing Upper body dressing   What is the patient wearing?: Pull over shirt    Upper body assist Assist Level: Minimal Assistance - Patient > 75%    Lower Body Dressing/Undressing Lower body dressing      What is the patient wearing?: Incontinence brief, Pants     Lower body assist Assist for lower body dressing: Moderate Assistance - Patient 50 - 74%     Toileting Toileting    Toileting assist Assist for toileting: Maximal Assistance - Patient 25 - 49%      Transfers Chair/bed transfer  Transfers assist     Chair/bed transfer assist level: Supervision/Verbal cueing     Locomotion Ambulation   Ambulation assist      Assist level: Supervision/Verbal cueing Assistive device: Rollator Max distance: 150'   Walk 10 feet activity   Assist     Assist level: Supervision/Verbal cueing Assistive device: Rollator   Walk 50 feet activity   Assist Walk 50 feet with 2 turns activity did not occur: Safety/medical concerns (fatigue)  Assist level: Supervision/Verbal cueing Assistive device: Rollator    Walk 150 feet activity   Assist Walk 150 feet activity did not occur: Safety/medical concerns  Assist level: Supervision/Verbal cueing Assistive device: Rollator    Walk 10 feet on uneven surface  activity   Assist Walk 10 feet on uneven surfaces activity did not occur: Safety/medical concerns   Assist level: Contact Guard/Touching assist Assistive device: Rollator   Wheelchair     Assist Is the patient using a wheelchair?: No Type of Wheelchair: Manual    Wheelchair assist level: Maximal Assistance - Patient 25 - 49% Max wheelchair distance: 32ft    Wheelchair 50 feet with 2 turns activity    Assist        Assist Level: Moderate Assistance - Patient 50 - 74%   Wheelchair 150 feet activity     Assist      Assist Level: Total Assistance - Patient < 25%   Blood pressure 113/73, pulse 87, temperature (!) 97.4 F (36.3 C), resp. rate 18, weight 56.2 kg, SpO2 96%.   Medical Problem List and Plan: 1. 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  Antithrombotics: -DVT/anticoagulation:  Pharmaceutical: Lovenox             -antiplatelet therapy: ASA/ 3. Pain Management: Tylenol prn.  4. Mood/Behavior/Sleep: LCSW to follow for  evaluation and support.              -antipsychotic agents: N/A             -wife is interested in neuropsych counseling for ?depression 5. Neuropsych/cognition: This patient is not fully capable of making decisions on his own behalf. 6. Skin/Wound Care: Air mattress for pressure relief and management of MASD/DTPI             --tube feeds and Prostat for malnutrition.              --Vitamin C added.  7. Fluids/Electrolytes/Nutrition: Monitor I/O. Continue tube feeds at nights             --  monitor for signs of overload.  8. Aspiration PNA: Sputum positive for Staph Lugdunensis. Augmentin thru 09/14 to complete 7 day course. 9. ETOH abuse w/withdrawal seizures: wife interested in ETOH counseling.              -discussed that majority of etoh counseling would be after his rehab stay 10. Acute on chronic anemia: Hgb has trended down form mid 8's to 6.9 on 9/12-->transfused with 1 unit PRBC             --will order FOBT and monitor for other signs of bleeding.  11.  Sever thiamine deficiency @ 1.1: Supplemented parentally X 5 days. Continue oral supplement.  12. H/o Chronic HFrEF w/recovery. S/p BIV ICD: Continues to have soft BP limiting GDMT.             --strict I/O. Daily wts to monitor for signs of overload.  Continue ASA/Statin.             --continue to hold Lisinopril and metoprolol. Monitor HR/for symptoms with increase with activity.  13. PAD s/p stent/aortobifemoral BG: On ASA and statin. Off Plavix due to low platelets/anemia.  14. Macrocytic anemia/ Low normal B12 @ 258: Started on supplement today. .   15. Dysphagia/chronic anorexia: Per family eats once a day.  Diet upgraded to regular             --continue on tube feeds at nights.  Ate 100% breakfast , will see what lunch intake is today  Will hold TF at noc if meal intake >50% for lunch and dinner  16. CKD?: SCr- 2.16 @ admission. Hyperkalemia today treated with lokelma --will change tube feeds to nephro due to worsening of  renal status-    Latest Ref Rng & Units 09/29/2022    1:21 PM 09/28/2022    6:38 AM 09/27/2022    8:25 AM  BMP  Glucose 70 - 99 mg/dL 259  563  875   BUN 8 - 23 mg/dL 40  38  40   Creatinine 0.61 - 1.24 mg/dL 6.43  3.29  5.18   Sodium 135 - 145 mmol/L 141  136  135   Potassium 3.5 - 5.1 mmol/L 4.5  4.4  5.1   Chloride 98 - 111 mmol/L 99  103  106   CO2 22 - 32 mmol/L 24  24  21    Calcium 8.9 - 10.3 mg/dL 9.4  8.8  8.9    --on water flushes 300 cc/QID. Strict I/O.   From 07/01/21 BUN 12 and Creat 1.59 looks to bee at baseline   17. Underweight: BMI reviewed and is 18.16  18. Phosphorus deficiency: repeat today, will not replete with kphos while potassium is elevated, phosphorus reviewed and has normalized  19. Hyperkalemia: lokelma ordered previously,Resolved, potassium reviewed and has normalized  20. Vitamin A deficiency: will hold off on supplementation as pills are uncomfortably large for patient to swallow  21. Tachycardia: 2 grams IV mag ordered 9/15, magnesium level reviewed  22. Early satiety: 2/2 TF, d/c TF  23. Low protein stores: educated regarding prioritizing protein in his diet    >50 minutes spent in discussing patient's progress during team conference, establishing d/c date of this Friday, reviewing patient's labs and discussing them with him, providing dietary education  LOS: 6 days A FACE TO FACE EVALUATION WAS PERFORMED  Kayron Hicklin P Ethne Jeon 09/30/2022, 11:12 AM

## 2022-09-30 NOTE — Progress Notes (Signed)
Speech Language Pathology Daily Session Note  Patient Details  Name: Derek Oconnell MRN: [id number] Date of Birth: Jun 18, 1955  Today's Date: 09/30/2022 SLP Individual Time: 4098-1191 SLP Individual Time Calculation (min): 45 min  Short Term Goals: Week 1: SLP Short Term Goal 1 (Week 1): STG = LTG due to ELOS  Skilled Therapeutic Interventions: SLP facilitated skilled therapy session targeting cognitive retraining goals. SLP and patient discussed previously reviewed WRAP memory strategies to assist with recall of daily activities. With assist of handout, patient stated all strategies and provided functional examples of how to use each in a home setting with supervisionA. During mildly complex cognitive task requiring organization, scanning, and sequencing, patient required minA for organization. Patient reports typical organization method is to "look for all the words at once" and just "see what pops out." SLP attempted to redirect patient to new, more efficient way of organization, however patient indicated that "both ways could work" and continued with his method, eventually identifying all words given supervision-minA overall. During session, MD entered to discuss patient's current status with patient recalling information with supervisionA after 15 min distracted delay. Patient was left in lowered bed with call bell in reach and bed alarm set. SLP will continue to target goals per plan of care.       Pain Pain Assessment Pain Scale: 0-10 Pain Score: 0-No pain  Therapy/Group: Individual Therapy  Jeannie Done, M.A., CCC-SLP  Yetta Barre 09/30/2022, 8:55 AM

## 2022-09-30 NOTE — Plan of Care (Signed)
  Problem: Consults Goal: RH STROKE PATIENT EDUCATION Description: See Patient Education module for education specifics  Outcome: Progressing   Problem: RH BOWEL ELIMINATION Goal: RH STG MANAGE BOWEL WITH ASSISTANCE Description: STG Manage Bowel with toileting Assistance. Outcome: Progressing Goal: RH STG MANAGE BOWEL W/MEDICATION W/ASSISTANCE Description: STG Manage Bowel with Medication with mod I Assistance. Outcome: Progressing   Problem: RH BLADDER ELIMINATION Goal: RH STG MANAGE BLADDER WITH ASSISTANCE Description: STG Manage Bladder With toileting Assistance Outcome: Progressing   Problem: RH SKIN INTEGRITY Goal: RH STG SKIN FREE OF INFECTION/BREAKDOWN Description: Manage w min assist Outcome: Progressing Goal: RH STG MAINTAIN SKIN INTEGRITY WITH ASSISTANCE Description: STG Maintain Skin Integrity With Assistance. Outcome: Progressing Goal: RH STG ABLE TO PERFORM INCISION/WOUND CARE W/ASSISTANCE Description: STG Able To Perform Incision/Wound Care With min Assistance. Outcome: Progressing   Problem: RH SAFETY Goal: RH STG ADHERE TO SAFETY PRECAUTIONS W/ASSISTANCE/DEVICE Description: STG Adhere to Safety Precautions With Assistance/Device. Outcome: Progressing   Problem: RH KNOWLEDGE DEFICIT Goal: RH STG INCREASE KNOWLEDGE OF HYPERTENSION Description: Patient and wife will be able to manage HTN with medications and dietary modifications using educational resources independently Outcome: Progressing Goal: RH STG INCREASE KNOWLEDGE OF DYSPHAGIA/FLUID INTAKE Description: Patient and wife will be able to manage dysphagia, medications and dietary modifications using educational resources independently Outcome: Progressing Goal: RH STG INCREASE KNOWLEGDE OF HYPERLIPIDEMIA Description: Patient and wife will be able to manage HLD with medications and dietary modifications using educational resources independently Outcome: Progressing Goal: RH STG INCREASE KNOWLEDGE OF  STROKE PROPHYLAXIS Description: Patient and wife will be able to manage secondary risks with medications and dietary modifications using educational resources independently Outcome: Progressing

## 2022-09-30 NOTE — Plan of Care (Signed)
  Problem: Consults Goal: RH STROKE PATIENT EDUCATION Description: See Patient Education module for education specifics  09/30/2022 1901 by Cletis Media, RN Outcome: Progressing 09/30/2022 1853 by Cletis Media, RN Outcome: Progressing   Problem: RH BOWEL ELIMINATION Goal: RH STG MANAGE BOWEL WITH ASSISTANCE Description: STG Manage Bowel with toileting Assistance. 09/30/2022 1901 by Cletis Media, RN Outcome: Progressing 09/30/2022 1853 by Cletis Media, RN Outcome: Progressing Goal: RH STG MANAGE BOWEL W/MEDICATION W/ASSISTANCE Description: STG Manage Bowel with Medication with mod I Assistance. 09/30/2022 1901 by Cletis Media, RN Outcome: Progressing 09/30/2022 1853 by Cletis Media, RN Outcome: Progressing   Problem: RH BLADDER ELIMINATION Goal: RH STG MANAGE BLADDER WITH ASSISTANCE Description: STG Manage Bladder With toileting Assistance 09/30/2022 1901 by Cletis Media, RN Outcome: Progressing 09/30/2022 1853 by Cletis Media, RN Outcome: Progressing   Problem: RH SKIN INTEGRITY Goal: RH STG SKIN FREE OF INFECTION/BREAKDOWN Description: Manage w min assist 09/30/2022 1901 by Cletis Media, RN Outcome: Progressing 09/30/2022 1853 by Cletis Media, RN Outcome: Progressing Goal: RH STG MAINTAIN SKIN INTEGRITY WITH ASSISTANCE Description: STG Maintain Skin Integrity With Assistance. 09/30/2022 1901 by Cletis Media, RN Outcome: Progressing 09/30/2022 1853 by Cletis Media, RN Outcome: Progressing Goal: RH STG ABLE TO PERFORM INCISION/WOUND CARE W/ASSISTANCE Description: STG Able To Perform Incision/Wound Care With min Assistance. 09/30/2022 1901 by Cletis Media, RN Outcome: Progressing 09/30/2022 1853 by Cletis Media, RN Outcome: Progressing   Problem: RH SAFETY Goal: RH STG ADHERE TO SAFETY PRECAUTIONS W/ASSISTANCE/DEVICE Description: STG Adhere to Safety Precautions With Assistance/Device. 09/30/2022 1901 by Cletis Media, RN Outcome:  Progressing 09/30/2022 1853 by Cletis Media, RN Outcome: Progressing   Problem: RH KNOWLEDGE DEFICIT Goal: RH STG INCREASE KNOWLEDGE OF HYPERTENSION Description: Patient and wife will be able to manage HTN with medications and dietary modifications using educational resources independently 09/30/2022 1901 by Cletis Media, RN Outcome: Progressing 09/30/2022 1853 by Cletis Media, RN Outcome: Progressing Goal: RH STG INCREASE KNOWLEDGE OF DYSPHAGIA/FLUID INTAKE Description: Patient and wife will be able to manage dysphagia, medications and dietary modifications using educational resources independently 09/30/2022 1901 by Cletis Media, RN Outcome: Progressing 09/30/2022 1853 by Cletis Media, RN Outcome: Progressing Goal: RH STG INCREASE KNOWLEGDE OF HYPERLIPIDEMIA Description: Patient and wife will be able to manage HLD with medications and dietary modifications using educational resources independently 09/30/2022 1901 by Cletis Media, RN Outcome: Progressing 09/30/2022 1853 by Cletis Media, RN Outcome: Progressing Goal: RH STG INCREASE KNOWLEDGE OF STROKE PROPHYLAXIS Description: Patient and wife will be able to manage secondary risks with medications and dietary modifications using educational resources independently 09/30/2022 1901 by Cletis Media, RN Outcome: Progressing 09/30/2022 1853 by Cletis Media, RN Outcome: Progressing

## 2022-09-30 NOTE — Progress Notes (Signed)
Occupational Therapy Session Note  Patient Details  Name: Derek Oconnell MRN: [id number] Date of Birth: 06/03/1955  Today's Date: 09/30/2022 OT Individual Time: 1000-1053 OT Individual Time Calculation (min): 53 min    Short Term Goals: Week 1:  OT Short Term Goal 1 (Week 1): STG=LTG due to LOS  Skilled Therapeutic Interventions/Progress Updates:    Patient received seated in recliner chair, agreeable to OT.  Patient declined showering or bathing/ changing clothes.  Patient declined need to void.  Patient indicates that those activities just wear him out.  Discussed with patient that part of the goal for rehab was to build his tolerance for daily activities.  Patient with limited interest in self care tasks, or leisure tasks.  Patient agreeable to take a long walk and go outside. Patient reports he enjoyed sitting on his front porch at home as this was the only place he was allowed to smoke.  Engaged patient in conversation about cooking - patient used to enjoy cooking, but has not engaged in this in months.  Patient reports only eating once daily - but able to get up and get himself his "drink of choice" during the day.   Patient can recognize that he is stronger and has more stamina versus when he arrived in the hospital.  Patient still having intermittent memory issues - unable to recall his fourth wife's name, or the medical event that lead him to need a walker last year.   Patient able to walk from his room, to bench outside - rest x 10 min and then return without a break.  Significant improvement.  Left up in recliner with safety belt in place and call bell / personal items in reach.    Therapy Documentation Precautions:  Precautions Precautions: Fall Precaution Comments: cortrak, ETOH abuse, watch HR Restrictions Weight Bearing Restrictions: No   Pain: Pain Assessment Pain Scale: 0-10 Pain Score: 0-No pain       Therapy/Group: Individual Therapy  [name] 09/30/2022, 12:37 PM

## 2022-10-01 ENCOUNTER — Other Ambulatory Visit (HOSPITAL_COMMUNITY): Payer: Self-pay

## 2022-10-01 MED ORDER — PYRIDOXINE HCL 100 MG PO TABS: 100.0000 mg | ORAL_TABLET | Freq: Every day | ORAL | 0 refills | Status: AC

## 2022-10-01 MED ORDER — ASCORBIC ACID 500 MG PO TABS: 500.0000 mg | ORAL_TABLET | Freq: Every day | ORAL | 0 refills | Status: AC

## 2022-10-01 MED ORDER — FOLIC ACID 1 MG PO TABS
1.0000 mg | ORAL_TABLET | Freq: Every day | ORAL | 0 refills | Status: DC
  Filled 2022-10-01: qty 30, 30d supply, fill #0

## 2022-10-01 MED ORDER — ZINC SULFATE 220 (50 ZN) MG PO CAPS: 220.0000 mg | ORAL_CAPSULE | Freq: Every day | ORAL | 0 refills | Status: AC

## 2022-10-01 MED ORDER — MIRTAZAPINE 30 MG PO TABS
30.0000 mg | ORAL_TABLET | Freq: Every day | ORAL | 0 refills | Status: DC
  Filled 2022-10-01: qty 30, 30d supply, fill #0

## 2022-10-01 MED ORDER — ZINC SULFATE 220 (50 ZN) MG PO CAPS
220.0000 mg | ORAL_CAPSULE | Freq: Every day | ORAL | 0 refills | Status: DC
  Filled 2022-10-01: qty 30, 30d supply, fill #0

## 2022-10-01 MED ORDER — MIRTAZAPINE 30 MG PO TABS: 30.0000 mg | ORAL_TABLET | Freq: Every day | ORAL | 0 refills | Status: AC

## 2022-10-01 MED ORDER — ATORVASTATIN CALCIUM 80 MG PO TABS: 80.0000 mg | ORAL_TABLET | Freq: Every day | ORAL | 0 refills | Status: AC

## 2022-10-01 MED ORDER — CLOPIDOGREL BISULFATE 75 MG PO TABS: 75.0000 mg | ORAL_TABLET | Freq: Every day | ORAL | Status: AC

## 2022-10-01 MED ORDER — FOLIC ACID 1 MG PO TABS: 1.0000 mg | ORAL_TABLET | Freq: Every day | ORAL | 0 refills | Status: DC

## 2022-10-01 MED ORDER — ATORVASTATIN CALCIUM 80 MG PO TABS
80.0000 mg | ORAL_TABLET | Freq: Every day | ORAL | 0 refills | Status: DC
  Filled 2022-10-01: qty 30, 30d supply, fill #0

## 2022-10-01 MED ORDER — PANTOPRAZOLE SODIUM 40 MG PO TBEC
40.0000 mg | DELAYED_RELEASE_TABLET | Freq: Every day | ORAL | 0 refills | Status: DC
  Filled 2022-10-01: qty 30, 30d supply, fill #0

## 2022-10-01 MED ORDER — ASCORBIC ACID 500 MG PO TABS
500.0000 mg | ORAL_TABLET | Freq: Every day | ORAL | 0 refills | Status: DC
  Filled 2022-10-01: qty 30, 30d supply, fill #0

## 2022-10-01 MED ORDER — VITAMIN B-1 100 MG PO TABS: 100.0000 mg | ORAL_TABLET | Freq: Every day | ORAL | 0 refills | Status: AC

## 2022-10-01 MED ORDER — ASPIRIN 81 MG PO TBEC
81.0000 mg | DELAYED_RELEASE_TABLET | Freq: Every day | ORAL | 0 refills | Status: DC
Start: 2022-10-01 — End: 2022-10-02
  Filled 2022-10-01: qty 30, 30d supply, fill #0

## 2022-10-01 MED ORDER — PYRIDOXINE HCL 100 MG PO TABS
100.0000 mg | ORAL_TABLET | Freq: Every day | ORAL | 0 refills | Status: DC
  Filled 2022-10-01: qty 30, 30d supply, fill #0

## 2022-10-01 MED ORDER — PANTOPRAZOLE SODIUM 40 MG PO TBEC: 40.0000 mg | DELAYED_RELEASE_TABLET | Freq: Every day | ORAL | 0 refills | Status: AC

## 2022-10-01 MED ORDER — VITAMIN B-1 100 MG PO TABS
100.0000 mg | ORAL_TABLET | Freq: Every day | ORAL | 0 refills | Status: DC
  Filled 2022-10-01: qty 30, 30d supply, fill #0

## 2022-10-01 NOTE — Progress Notes (Addendum)
Patient ID: Derek Oconnell, male   DOB: 12-Jan-1956, 67 y.o.   MRN: [id number]  referral made to [facility] in [place] for OPPT & OT this is closer to where pt and wife live. Order and clinicals sent to them and they will reach out to schedule appointments once have the orders. Pt has no equipment needs-has all needed from past admissions. Will give substance abuse resources to pt and wife, pt feels not necessary

## 2022-10-01 NOTE — Plan of Care (Signed)
  Problem: RH Swallowing Goal: LTG Patient will consume least restrictive diet using compensatory strategies with assistance (SLP) Description: LTG:  Patient will consume least restrictive diet using compensatory strategies with assistance (SLP) Outcome: Completed/Met   Problem: RH Cognition - SLP Goal: RH LTG Patient will demonstrate orientation with cues Description:  LTG:  Patient will demonstrate orientation to person/place/time/situation with cues (SLP)   Outcome: Completed/Met   Problem: RH Problem Solving Goal: LTG Patient will demonstrate problem solving for (SLP) Description: LTG:  Patient will demonstrate problem solving for basic/complex daily situations with cues  (SLP) Outcome: Completed/Met   Problem: RH Memory Goal: LTG Patient will use memory compensatory aids to (SLP) Description: LTG:  Patient will use memory compensatory aids to recall biographical/new, daily complex information with cues (SLP) Outcome: Completed/Met   Problem: RH Awareness Goal: LTG: Patient will demonstrate awareness during functional activites type of (SLP) Description: LTG: Patient will demonstrate awareness during functional activites type of (SLP) Outcome: Completed/Met

## 2022-10-01 NOTE — Discharge Summary (Signed)
Physician Discharge Summary  Patient ID: Derek Oconnell MRN: [id number] DOB/AGE: 03-20-1955 67 y.o.  Admit date: 09/24/2022 Discharge date: 10/02/2022  Discharge Diagnoses:  Principal Problem:   CVA (cerebral vascular accident) [facility]) Active Problems:   Protein-calorie malnutrition, severe   CKD stage 3a, GFR 45-59 ml/min (HCC)   Dysphagia due to recent stroke Vitamin A deficiency Thiamine deficiency Low Vitamin B 12 level.   Discharged Condition: stable  Significant Diagnostic Studies: DG Swallowing Func-Speech Pathology  Result Date: 09/22/2022 Table formatting from the original result was not included. Modified Barium Swallow Study Patient Details Name: Derek Oconnell MRN: [id number] Date of Birth: Oct 15, 1955 Today's Date: 09/22/2022 HPI/PMH: HPI: Pt is a 67 y.o. male who presented to [facility] on 09/15/22 with AMS and seizure-like activity. Pt was sedated and intubated then transferred to [facility] for LTM. 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 NPO diet ordered by ST on 09/07. He has since weaned off norepinephrine and was transferred out of ICU on 9/9.  PMH is significant for HTN, CHF, CAD s/p stenting and PPM, COPD, EtOH dependence. Clinical Impression: Clinical Impression: Pt presents with an oral dysphagia primarily characterized by poor bolus awareness, control, holding, and mastication. No s/sx of aspiration or penetration were observed during this evaluation. Oral manipulation of bolus was prolonged and disorganized with all consistencies administered, but was most impaired with solid. Mastication was incredibly prolonged. Pt appeared distractable during mastication and required one verbal cue to redirect attention back to swallow during solid administration. One instance of a delayed cough with no asipiration or penetration present. Trace residue observed on both oral cavity and pharyngeal structures. Airway protection was overall functional.  Swallow was initiated at the level of the vallecula and epiglottic inversion wad complete. SLP reccommendation of Dys 1 (puree) and thin liquids with full supervsion to ensure pt does not become distracted. SLP will continue to follow for diet toleration and upgraded PO trials. Factors that may increase risk of adverse event in presence of aspiration Rubye Oaks & Clearance Coots 2021): Factors that may increase risk of adverse event in presence of aspiration Rubye Oaks & Clearance Coots 2021): Reduced cognitive function Recommendations/Plan: Swallowing Evaluation Recommendations Swallowing Evaluation Recommendations Recommendations: PO diet PO Diet Recommendation: Dysphagia 1 (Pureed); Thin liquids (Level 0) Liquid Administration via: Cup; Straw; Spoon Medication Administration: Crushed with puree Supervision: Full supervision/cueing for swallowing strategies Swallowing strategies  : Slow rate; Small bites/sips Postural changes: Position pt fully upright for meals Oral care recommendations: Oral care BID (2x/day); Staff/trained caregiver to provide oral care Treatment Plan Treatment Plan Treatment recommendations: Therapy as outlined in treatment plan below Follow-up recommendations: Acute inpatient rehab (3 hours/day) Functional status assessment: Patient has had a recent decline in their functional status and demonstrates the ability to make significant improvements in function in a reasonable and predictable amount of time. Treatment frequency: Min 2x/week Treatment duration: 2 weeks Interventions: Aspiration precaution training; Diet toleration management by SLP; Patient/family education; Trials of upgraded texture/liquids Recommendations Recommendations for follow up therapy are one component of a multi-disciplinary discharge planning process, led by the attending physician.  Recommendations may be updated based on patient status, additional functional criteria and insurance authorization. Assessment: Orofacial Exam: Orofacial Exam  Oral Cavity: Oral Hygiene: WFL Oral Cavity - Dentition: Edentulous Orofacial Anatomy: WFL Oral Motor/Sensory Function: WFL Anatomy: Anatomy: WFL Boluses Administered: Boluses Administered Boluses Administered: Thin liquids (Level 0); Mildly thick liquids (Level 2, nectar thick); Moderately thick liquids (Level 3, honey thick); Puree;  Physician Discharge Summary  Patient ID: Derek Oconnell MRN: [id number] DOB/AGE: 03-20-1955 67 y.o.  Admit date: 09/24/2022 Discharge date: 10/02/2022  Discharge Diagnoses:  Principal Problem:   CVA (cerebral vascular accident) [facility]) Active Problems:   Protein-calorie malnutrition, severe   CKD stage 3a, GFR 45-59 ml/min (HCC)   Dysphagia due to recent stroke Vitamin A deficiency Thiamine deficiency Low Vitamin B 12 level.   Discharged Condition: stable  Significant Diagnostic Studies: DG Swallowing Func-Speech Pathology  Result Date: 09/22/2022 Table formatting from the original result was not included. Modified Barium Swallow Study Patient Details Name: Derek Oconnell MRN: [id number] Date of Birth: Oct 15, 1955 Today's Date: 09/22/2022 HPI/PMH: HPI: Pt is a 67 y.o. male who presented to [facility] on 09/15/22 with AMS and seizure-like activity. Pt was sedated and intubated then transferred to [facility] for LTM. 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 NPO diet ordered by ST on 09/07. He has since weaned off norepinephrine and was transferred out of ICU on 9/9.  PMH is significant for HTN, CHF, CAD s/p stenting and PPM, COPD, EtOH dependence. Clinical Impression: Clinical Impression: Pt presents with an oral dysphagia primarily characterized by poor bolus awareness, control, holding, and mastication. No s/sx of aspiration or penetration were observed during this evaluation. Oral manipulation of bolus was prolonged and disorganized with all consistencies administered, but was most impaired with solid. Mastication was incredibly prolonged. Pt appeared distractable during mastication and required one verbal cue to redirect attention back to swallow during solid administration. One instance of a delayed cough with no asipiration or penetration present. Trace residue observed on both oral cavity and pharyngeal structures. Airway protection was overall functional.  Swallow was initiated at the level of the vallecula and epiglottic inversion wad complete. SLP reccommendation of Dys 1 (puree) and thin liquids with full supervsion to ensure pt does not become distracted. SLP will continue to follow for diet toleration and upgraded PO trials. Factors that may increase risk of adverse event in presence of aspiration Rubye Oaks & Clearance Coots 2021): Factors that may increase risk of adverse event in presence of aspiration Rubye Oaks & Clearance Coots 2021): Reduced cognitive function Recommendations/Plan: Swallowing Evaluation Recommendations Swallowing Evaluation Recommendations Recommendations: PO diet PO Diet Recommendation: Dysphagia 1 (Pureed); Thin liquids (Level 0) Liquid Administration via: Cup; Straw; Spoon Medication Administration: Crushed with puree Supervision: Full supervision/cueing for swallowing strategies Swallowing strategies  : Slow rate; Small bites/sips Postural changes: Position pt fully upright for meals Oral care recommendations: Oral care BID (2x/day); Staff/trained caregiver to provide oral care Treatment Plan Treatment Plan Treatment recommendations: Therapy as outlined in treatment plan below Follow-up recommendations: Acute inpatient rehab (3 hours/day) Functional status assessment: Patient has had a recent decline in their functional status and demonstrates the ability to make significant improvements in function in a reasonable and predictable amount of time. Treatment frequency: Min 2x/week Treatment duration: 2 weeks Interventions: Aspiration precaution training; Diet toleration management by SLP; Patient/family education; Trials of upgraded texture/liquids Recommendations Recommendations for follow up therapy are one component of a multi-disciplinary discharge planning process, led by the attending physician.  Recommendations may be updated based on patient status, additional functional criteria and insurance authorization. Assessment: Orofacial Exam: Orofacial Exam  Oral Cavity: Oral Hygiene: WFL Oral Cavity - Dentition: Edentulous Orofacial Anatomy: WFL Oral Motor/Sensory Function: WFL Anatomy: Anatomy: WFL Boluses Administered: Boluses Administered Boluses Administered: Thin liquids (Level 0); Mildly thick liquids (Level 2, nectar thick); Moderately thick liquids (Level 3, honey thick); Puree;  Physician Discharge Summary  Patient ID: Derek Oconnell MRN: [id number] DOB/AGE: 03-20-1955 67 y.o.  Admit date: 09/24/2022 Discharge date: 10/02/2022  Discharge Diagnoses:  Principal Problem:   CVA (cerebral vascular accident) [facility]) Active Problems:   Protein-calorie malnutrition, severe   CKD stage 3a, GFR 45-59 ml/min (HCC)   Dysphagia due to recent stroke Vitamin A deficiency Thiamine deficiency Low Vitamin B 12 level.   Discharged Condition: stable  Significant Diagnostic Studies: DG Swallowing Func-Speech Pathology  Result Date: 09/22/2022 Table formatting from the original result was not included. Modified Barium Swallow Study Patient Details Name: Derek Oconnell MRN: [id number] Date of Birth: Oct 15, 1955 Today's Date: 09/22/2022 HPI/PMH: HPI: Pt is a 67 y.o. male who presented to [facility] on 09/15/22 with AMS and seizure-like activity. Pt was sedated and intubated then transferred to [facility] for LTM. 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 NPO diet ordered by ST on 09/07. He has since weaned off norepinephrine and was transferred out of ICU on 9/9.  PMH is significant for HTN, CHF, CAD s/p stenting and PPM, COPD, EtOH dependence. Clinical Impression: Clinical Impression: Pt presents with an oral dysphagia primarily characterized by poor bolus awareness, control, holding, and mastication. No s/sx of aspiration or penetration were observed during this evaluation. Oral manipulation of bolus was prolonged and disorganized with all consistencies administered, but was most impaired with solid. Mastication was incredibly prolonged. Pt appeared distractable during mastication and required one verbal cue to redirect attention back to swallow during solid administration. One instance of a delayed cough with no asipiration or penetration present. Trace residue observed on both oral cavity and pharyngeal structures. Airway protection was overall functional.  Swallow was initiated at the level of the vallecula and epiglottic inversion wad complete. SLP reccommendation of Dys 1 (puree) and thin liquids with full supervsion to ensure pt does not become distracted. SLP will continue to follow for diet toleration and upgraded PO trials. Factors that may increase risk of adverse event in presence of aspiration Rubye Oaks & Clearance Coots 2021): Factors that may increase risk of adverse event in presence of aspiration Rubye Oaks & Clearance Coots 2021): Reduced cognitive function Recommendations/Plan: Swallowing Evaluation Recommendations Swallowing Evaluation Recommendations Recommendations: PO diet PO Diet Recommendation: Dysphagia 1 (Pureed); Thin liquids (Level 0) Liquid Administration via: Cup; Straw; Spoon Medication Administration: Crushed with puree Supervision: Full supervision/cueing for swallowing strategies Swallowing strategies  : Slow rate; Small bites/sips Postural changes: Position pt fully upright for meals Oral care recommendations: Oral care BID (2x/day); Staff/trained caregiver to provide oral care Treatment Plan Treatment Plan Treatment recommendations: Therapy as outlined in treatment plan below Follow-up recommendations: Acute inpatient rehab (3 hours/day) Functional status assessment: Patient has had a recent decline in their functional status and demonstrates the ability to make significant improvements in function in a reasonable and predictable amount of time. Treatment frequency: Min 2x/week Treatment duration: 2 weeks Interventions: Aspiration precaution training; Diet toleration management by SLP; Patient/family education; Trials of upgraded texture/liquids Recommendations Recommendations for follow up therapy are one component of a multi-disciplinary discharge planning process, led by the attending physician.  Recommendations may be updated based on patient status, additional functional criteria and insurance authorization. Assessment: Orofacial Exam: Orofacial Exam  Oral Cavity: Oral Hygiene: WFL Oral Cavity - Dentition: Edentulous Orofacial Anatomy: WFL Oral Motor/Sensory Function: WFL Anatomy: Anatomy: WFL Boluses Administered: Boluses Administered Boluses Administered: Thin liquids (Level 0); Mildly thick liquids (Level 2, nectar thick); Moderately thick liquids (Level 3, honey thick); Puree;  Physician Discharge Summary  Patient ID: Derek Oconnell MRN: [id number] DOB/AGE: 03-20-1955 67 y.o.  Admit date: 09/24/2022 Discharge date: 10/02/2022  Discharge Diagnoses:  Principal Problem:   CVA (cerebral vascular accident) [facility]) Active Problems:   Protein-calorie malnutrition, severe   CKD stage 3a, GFR 45-59 ml/min (HCC)   Dysphagia due to recent stroke Vitamin A deficiency Thiamine deficiency Low Vitamin B 12 level.   Discharged Condition: stable  Significant Diagnostic Studies: DG Swallowing Func-Speech Pathology  Result Date: 09/22/2022 Table formatting from the original result was not included. Modified Barium Swallow Study Patient Details Name: Derek Oconnell MRN: [id number] Date of Birth: Oct 15, 1955 Today's Date: 09/22/2022 HPI/PMH: HPI: Pt is a 67 y.o. male who presented to [facility] on 09/15/22 with AMS and seizure-like activity. Pt was sedated and intubated then transferred to [facility] for LTM. 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 NPO diet ordered by ST on 09/07. He has since weaned off norepinephrine and was transferred out of ICU on 9/9.  PMH is significant for HTN, CHF, CAD s/p stenting and PPM, COPD, EtOH dependence. Clinical Impression: Clinical Impression: Pt presents with an oral dysphagia primarily characterized by poor bolus awareness, control, holding, and mastication. No s/sx of aspiration or penetration were observed during this evaluation. Oral manipulation of bolus was prolonged and disorganized with all consistencies administered, but was most impaired with solid. Mastication was incredibly prolonged. Pt appeared distractable during mastication and required one verbal cue to redirect attention back to swallow during solid administration. One instance of a delayed cough with no asipiration or penetration present. Trace residue observed on both oral cavity and pharyngeal structures. Airway protection was overall functional.  Swallow was initiated at the level of the vallecula and epiglottic inversion wad complete. SLP reccommendation of Dys 1 (puree) and thin liquids with full supervsion to ensure pt does not become distracted. SLP will continue to follow for diet toleration and upgraded PO trials. Factors that may increase risk of adverse event in presence of aspiration Rubye Oaks & Clearance Coots 2021): Factors that may increase risk of adverse event in presence of aspiration Rubye Oaks & Clearance Coots 2021): Reduced cognitive function Recommendations/Plan: Swallowing Evaluation Recommendations Swallowing Evaluation Recommendations Recommendations: PO diet PO Diet Recommendation: Dysphagia 1 (Pureed); Thin liquids (Level 0) Liquid Administration via: Cup; Straw; Spoon Medication Administration: Crushed with puree Supervision: Full supervision/cueing for swallowing strategies Swallowing strategies  : Slow rate; Small bites/sips Postural changes: Position pt fully upright for meals Oral care recommendations: Oral care BID (2x/day); Staff/trained caregiver to provide oral care Treatment Plan Treatment Plan Treatment recommendations: Therapy as outlined in treatment plan below Follow-up recommendations: Acute inpatient rehab (3 hours/day) Functional status assessment: Patient has had a recent decline in their functional status and demonstrates the ability to make significant improvements in function in a reasonable and predictable amount of time. Treatment frequency: Min 2x/week Treatment duration: 2 weeks Interventions: Aspiration precaution training; Diet toleration management by SLP; Patient/family education; Trials of upgraded texture/liquids Recommendations Recommendations for follow up therapy are one component of a multi-disciplinary discharge planning process, led by the attending physician.  Recommendations may be updated based on patient status, additional functional criteria and insurance authorization. Assessment: Orofacial Exam: Orofacial Exam  Oral Cavity: Oral Hygiene: WFL Oral Cavity - Dentition: Edentulous Orofacial Anatomy: WFL Oral Motor/Sensory Function: WFL Anatomy: Anatomy: WFL Boluses Administered: Boluses Administered Boluses Administered: Thin liquids (Level 0); Mildly thick liquids (Level 2, nectar thick); Moderately thick liquids (Level 3, honey thick); Puree;  thickening left maxillary sinus. Orbits are unremarkable. Other: None. IMPRESSION: Motion degraded exam. 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 No definite evidence of hemorrhage. These results will be called to the ordering clinician or representative by the Radiologist Assistant, and communication documented in the PACS or [employer]. Electronically Signed   By: Lorenza Cambridge M.D.   On: 09/18/2022 18:26   ECHOCARDIOGRAM COMPLETE  Result Date: 09/16/2022    ECHOCARDIOGRAM REPORT   Patient Name:   Derek Oconnell Date of Exam: 09/16/2022 Medical Rec #:  [id number]    Height:       70.0 in Accession #:    [id number]   Weight:       122.4 lb Date of Birth:  08/27/1955    BSA:          1.694 m Patient Age:    67 years     BP:           111/60 mmHg Patient Gender: M            HR:           75 bpm. Exam Location:  Inpatient Procedure: 2D Echo,  Cardiac Doppler, Color Doppler and Intracardiac            Opacification Agent Indications:    Elevated Troponin  History:        Patient has no prior history of Echocardiogram examinations.                 Previous Myocardial Infarction.  Sonographer:    Darlys Gales Referring Phys: [id number] RAVI AGARWALA  Sonographer Comments: Echo performed with patient supine and on artificial respirator. Image acquisition challenging due to patient body habitus. IMPRESSIONS  1. Left ventricular ejection fraction, by estimation, is 60 to 65%. The left ventricle has normal function. The left ventricle has no regional wall motion abnormalities. Left ventricular diastolic parameters are consistent with Grade II diastolic dysfunction (pseudonormalization).  2. Right ventricular systolic function was not well visualized. The right ventricular size is not well visualized. Tricuspid regurgitation signal is inadequate for assessing PA pressure.  3. The mitral valve was not well visualized. No evidence of mitral valve regurgitation.  4. The aortic valve was not well visualized. Aortic valve regurgitation is not visualized.  5. Aortic not well visualized.  6. The inferior vena cava IVC not well visualized. FINDINGS  Left Ventricle: Left ventricular ejection fraction, by estimation, is 60 to 65%. The left ventricle has normal function. The left ventricle has no regional wall motion abnormalities. Definity contrast agent was given IV to delineate the left ventricular  endocardial borders. The left ventricular internal cavity size was normal in size. There is no left ventricular hypertrophy. Left ventricular diastolic parameters are consistent with Grade II diastolic dysfunction (pseudonormalization). Right Ventricle: The right ventricular size is not well visualized. Right ventricular systolic function was not well visualized. Tricuspid regurgitation signal is inadequate for assessing PA pressure. Left Atrium: Left atrial size was normal in  size. Right Atrium: Right atrial size was not well visualized. Pericardium: There is no evidence of pericardial effusion. Mitral Valve: The mitral valve was not well visualized. No evidence of mitral valve regurgitation. Tricuspid Valve: Tricuspid valve regurgitation is not demonstrated. Aortic Valve: The aortic valve was not well visualized. Aortic valve regurgitation is not visualized. Aortic valve peak gradient measures 2.5 mmHg. Pulmonic Valve: Pulmonic valve regurgitation is not visualized. Aorta: Not well visualized. Venous: The inferior vena cava IVC not well visualized. IAS/Shunts:  thickening left maxillary sinus. Orbits are unremarkable. Other: None. IMPRESSION: Motion degraded exam. 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 No definite evidence of hemorrhage. These results will be called to the ordering clinician or representative by the Radiologist Assistant, and communication documented in the PACS or [employer]. Electronically Signed   By: Lorenza Cambridge M.D.   On: 09/18/2022 18:26   ECHOCARDIOGRAM COMPLETE  Result Date: 09/16/2022    ECHOCARDIOGRAM REPORT   Patient Name:   Derek Oconnell Date of Exam: 09/16/2022 Medical Rec #:  [id number]    Height:       70.0 in Accession #:    [id number]   Weight:       122.4 lb Date of Birth:  08/27/1955    BSA:          1.694 m Patient Age:    67 years     BP:           111/60 mmHg Patient Gender: M            HR:           75 bpm. Exam Location:  Inpatient Procedure: 2D Echo,  Cardiac Doppler, Color Doppler and Intracardiac            Opacification Agent Indications:    Elevated Troponin  History:        Patient has no prior history of Echocardiogram examinations.                 Previous Myocardial Infarction.  Sonographer:    Darlys Gales Referring Phys: [id number] RAVI AGARWALA  Sonographer Comments: Echo performed with patient supine and on artificial respirator. Image acquisition challenging due to patient body habitus. IMPRESSIONS  1. Left ventricular ejection fraction, by estimation, is 60 to 65%. The left ventricle has normal function. The left ventricle has no regional wall motion abnormalities. Left ventricular diastolic parameters are consistent with Grade II diastolic dysfunction (pseudonormalization).  2. Right ventricular systolic function was not well visualized. The right ventricular size is not well visualized. Tricuspid regurgitation signal is inadequate for assessing PA pressure.  3. The mitral valve was not well visualized. No evidence of mitral valve regurgitation.  4. The aortic valve was not well visualized. Aortic valve regurgitation is not visualized.  5. Aortic not well visualized.  6. The inferior vena cava IVC not well visualized. FINDINGS  Left Ventricle: Left ventricular ejection fraction, by estimation, is 60 to 65%. The left ventricle has normal function. The left ventricle has no regional wall motion abnormalities. Definity contrast agent was given IV to delineate the left ventricular  endocardial borders. The left ventricular internal cavity size was normal in size. There is no left ventricular hypertrophy. Left ventricular diastolic parameters are consistent with Grade II diastolic dysfunction (pseudonormalization). Right Ventricle: The right ventricular size is not well visualized. Right ventricular systolic function was not well visualized. Tricuspid regurgitation signal is inadequate for assessing PA pressure. Left Atrium: Left atrial size was normal in  size. Right Atrium: Right atrial size was not well visualized. Pericardium: There is no evidence of pericardial effusion. Mitral Valve: The mitral valve was not well visualized. No evidence of mitral valve regurgitation. Tricuspid Valve: Tricuspid valve regurgitation is not demonstrated. Aortic Valve: The aortic valve was not well visualized. Aortic valve regurgitation is not visualized. Aortic valve peak gradient measures 2.5 mmHg. Pulmonic Valve: Pulmonic valve regurgitation is not visualized. Aorta: Not well visualized. Venous: The inferior vena cava IVC not well visualized. IAS/Shunts:  thickening left maxillary sinus. Orbits are unremarkable. Other: None. IMPRESSION: Motion degraded exam. 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 No definite evidence of hemorrhage. These results will be called to the ordering clinician or representative by the Radiologist Assistant, and communication documented in the PACS or [employer]. Electronically Signed   By: Lorenza Cambridge M.D.   On: 09/18/2022 18:26   ECHOCARDIOGRAM COMPLETE  Result Date: 09/16/2022    ECHOCARDIOGRAM REPORT   Patient Name:   Derek Oconnell Date of Exam: 09/16/2022 Medical Rec #:  [id number]    Height:       70.0 in Accession #:    [id number]   Weight:       122.4 lb Date of Birth:  08/27/1955    BSA:          1.694 m Patient Age:    67 years     BP:           111/60 mmHg Patient Gender: M            HR:           75 bpm. Exam Location:  Inpatient Procedure: 2D Echo,  Cardiac Doppler, Color Doppler and Intracardiac            Opacification Agent Indications:    Elevated Troponin  History:        Patient has no prior history of Echocardiogram examinations.                 Previous Myocardial Infarction.  Sonographer:    Darlys Gales Referring Phys: [id number] RAVI AGARWALA  Sonographer Comments: Echo performed with patient supine and on artificial respirator. Image acquisition challenging due to patient body habitus. IMPRESSIONS  1. Left ventricular ejection fraction, by estimation, is 60 to 65%. The left ventricle has normal function. The left ventricle has no regional wall motion abnormalities. Left ventricular diastolic parameters are consistent with Grade II diastolic dysfunction (pseudonormalization).  2. Right ventricular systolic function was not well visualized. The right ventricular size is not well visualized. Tricuspid regurgitation signal is inadequate for assessing PA pressure.  3. The mitral valve was not well visualized. No evidence of mitral valve regurgitation.  4. The aortic valve was not well visualized. Aortic valve regurgitation is not visualized.  5. Aortic not well visualized.  6. The inferior vena cava IVC not well visualized. FINDINGS  Left Ventricle: Left ventricular ejection fraction, by estimation, is 60 to 65%. The left ventricle has normal function. The left ventricle has no regional wall motion abnormalities. Definity contrast agent was given IV to delineate the left ventricular  endocardial borders. The left ventricular internal cavity size was normal in size. There is no left ventricular hypertrophy. Left ventricular diastolic parameters are consistent with Grade II diastolic dysfunction (pseudonormalization). Right Ventricle: The right ventricular size is not well visualized. Right ventricular systolic function was not well visualized. Tricuspid regurgitation signal is inadequate for assessing PA pressure. Left Atrium: Left atrial size was normal in  size. Right Atrium: Right atrial size was not well visualized. Pericardium: There is no evidence of pericardial effusion. Mitral Valve: The mitral valve was not well visualized. No evidence of mitral valve regurgitation. Tricuspid Valve: Tricuspid valve regurgitation is not demonstrated. Aortic Valve: The aortic valve was not well visualized. Aortic valve regurgitation is not visualized. Aortic valve peak gradient measures 2.5 mmHg. Pulmonic Valve: Pulmonic valve regurgitation is not visualized. Aorta: Not well visualized. Venous: The inferior vena cava IVC not well visualized. IAS/Shunts:  Physician Discharge Summary  Patient ID: Derek Oconnell MRN: [id number] DOB/AGE: 03-20-1955 67 y.o.  Admit date: 09/24/2022 Discharge date: 10/02/2022  Discharge Diagnoses:  Principal Problem:   CVA (cerebral vascular accident) [facility]) Active Problems:   Protein-calorie malnutrition, severe   CKD stage 3a, GFR 45-59 ml/min (HCC)   Dysphagia due to recent stroke Vitamin A deficiency Thiamine deficiency Low Vitamin B 12 level.   Discharged Condition: stable  Significant Diagnostic Studies: DG Swallowing Func-Speech Pathology  Result Date: 09/22/2022 Table formatting from the original result was not included. Modified Barium Swallow Study Patient Details Name: Derek Oconnell MRN: [id number] Date of Birth: Oct 15, 1955 Today's Date: 09/22/2022 HPI/PMH: HPI: Pt is a 67 y.o. male who presented to [facility] on 09/15/22 with AMS and seizure-like activity. Pt was sedated and intubated then transferred to [facility] for LTM. 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 NPO diet ordered by ST on 09/07. He has since weaned off norepinephrine and was transferred out of ICU on 9/9.  PMH is significant for HTN, CHF, CAD s/p stenting and PPM, COPD, EtOH dependence. Clinical Impression: Clinical Impression: Pt presents with an oral dysphagia primarily characterized by poor bolus awareness, control, holding, and mastication. No s/sx of aspiration or penetration were observed during this evaluation. Oral manipulation of bolus was prolonged and disorganized with all consistencies administered, but was most impaired with solid. Mastication was incredibly prolonged. Pt appeared distractable during mastication and required one verbal cue to redirect attention back to swallow during solid administration. One instance of a delayed cough with no asipiration or penetration present. Trace residue observed on both oral cavity and pharyngeal structures. Airway protection was overall functional.  Swallow was initiated at the level of the vallecula and epiglottic inversion wad complete. SLP reccommendation of Dys 1 (puree) and thin liquids with full supervsion to ensure pt does not become distracted. SLP will continue to follow for diet toleration and upgraded PO trials. Factors that may increase risk of adverse event in presence of aspiration Rubye Oaks [employer] Clearance Coots 2021): Factors that may increase risk of adverse event in presence of aspiration Rubye Oaks & Clearance Coots 2021): Reduced cognitive function Recommendations/Plan: Swallowing Evaluation Recommendations Swallowing Evaluation Recommendations Recommendations: PO diet PO Diet Recommendation: Dysphagia 1 (Pureed); Thin liquids (Level 0) Liquid Administration via: Cup; Straw; Spoon Medication Administration: Crushed with puree Supervision: Full supervision/cueing for swallowing strategies Swallowing strategies  : Slow rate; Small bites/sips Postural changes: Position pt fully upright for meals Oral care recommendations: Oral care BID (2x/day); Staff/trained caregiver to provide oral care Treatment Plan Treatment Plan Treatment recommendations: Therapy as outlined in treatment plan below Follow-up recommendations: Acute inpatient rehab (3 hours/day) Functional status assessment: Patient has had a recent decline in their functional status and demonstrates the ability to make significant improvements in function in a reasonable and predictable amount of time. Treatment frequency: Min 2x/week Treatment duration: 2 weeks Interventions: Aspiration precaution training; Diet toleration management by SLP; Patient/family education; Trials of upgraded texture/liquids Recommendations Recommendations for follow up therapy are one component of a multi-disciplinary discharge planning process, led by the attending physician.  Recommendations may be updated based on patient status, additional functional criteria and insurance authorization. Assessment: Orofacial Exam: Orofacial Exam  Oral Cavity: Oral Hygiene: WFL Oral Cavity - Dentition: Edentulous Orofacial Anatomy: WFL Oral Motor/Sensory Function: WFL Anatomy: Anatomy: WFL Boluses Administered: Boluses Administered Boluses Administered: Thin liquids (Level 0); Mildly thick liquids (Level 2, nectar thick); Moderately thick liquids (Level 3, honey thick); Puree;  thickening left maxillary sinus. Orbits are unremarkable. Other: None. IMPRESSION: Motion degraded exam. 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 No definite evidence of hemorrhage. These results will be called to the ordering clinician or representative by the Radiologist Assistant, and communication documented in the PACS or [employer]. Electronically Signed   By: Lorenza Cambridge M.D.   On: 09/18/2022 18:26   ECHOCARDIOGRAM COMPLETE  Result Date: 09/16/2022    ECHOCARDIOGRAM REPORT   Patient Name:   Derek Oconnell Date of Exam: 09/16/2022 Medical Rec #:  [id number]    Height:       70.0 in Accession #:    [id number]   Weight:       122.4 lb Date of Birth:  08/27/1955    BSA:          1.694 m Patient Age:    67 years     BP:           111/60 mmHg Patient Gender: M            HR:           75 bpm. Exam Location:  Inpatient Procedure: 2D Echo,  Cardiac Doppler, Color Doppler and Intracardiac            Opacification Agent Indications:    Elevated Troponin  History:        Patient has no prior history of Echocardiogram examinations.                 Previous Myocardial Infarction.  Sonographer:    Darlys Gales Referring Phys: [id number] RAVI AGARWALA  Sonographer Comments: Echo performed with patient supine and on artificial respirator. Image acquisition challenging due to patient body habitus. IMPRESSIONS  1. Left ventricular ejection fraction, by estimation, is 60 to 65%. The left ventricle has normal function. The left ventricle has no regional wall motion abnormalities. Left ventricular diastolic parameters are consistent with Grade II diastolic dysfunction (pseudonormalization).  2. Right ventricular systolic function was not well visualized. The right ventricular size is not well visualized. Tricuspid regurgitation signal is inadequate for assessing PA pressure.  3. The mitral valve was not well visualized. No evidence of mitral valve regurgitation.  4. The aortic valve was not well visualized. Aortic valve regurgitation is not visualized.  5. Aortic not well visualized.  6. The inferior vena cava IVC not well visualized. FINDINGS  Left Ventricle: Left ventricular ejection fraction, by estimation, is 60 to 65%. The left ventricle has normal function. The left ventricle has no regional wall motion abnormalities. Definity contrast agent was given IV to delineate the left ventricular  endocardial borders. The left ventricular internal cavity size was normal in size. There is no left ventricular hypertrophy. Left ventricular diastolic parameters are consistent with Grade II diastolic dysfunction (pseudonormalization). Right Ventricle: The right ventricular size is not well visualized. Right ventricular systolic function was not well visualized. Tricuspid regurgitation signal is inadequate for assessing PA pressure. Left Atrium: Left atrial size was normal in  size. Right Atrium: Right atrial size was not well visualized. Pericardium: There is no evidence of pericardial effusion. Mitral Valve: The mitral valve was not well visualized. No evidence of mitral valve regurgitation. Tricuspid Valve: Tricuspid valve regurgitation is not demonstrated. Aortic Valve: The aortic valve was not well visualized. Aortic valve regurgitation is not visualized. Aortic valve peak gradient measures 2.5 mmHg. Pulmonic Valve: Pulmonic valve regurgitation is not visualized. Aorta: Not well visualized. Venous: The inferior vena cava IVC not well visualized. IAS/Shunts:

## 2022-10-01 NOTE — Patient Care Conference (Addendum)
Inpatient RehabilitationTeam Conference and Plan of Care Update Date: 09/30/2022   Time: 11:10 AM    Patient Name: Derek Oconnell      Medical Record Number: [id number]  Date of Birth: 1955-01-25 Sex: Male         Room/Bed: 4W21C/4W21C-01 Payor Info: Payor: MEDICARE / Plan: MEDICARE PART A AND B / Product Type: *No Product type* /    Admit Date/Time:  09/24/2022  6:02 PM  Primary Diagnosis:  CVA (cerebral vascular accident) [facility])  Hospital Problems: Principal Problem:   CVA (cerebral vascular accident) (HCC) Active Problems:   Protein-calorie malnutrition, severe   CKD stage 3a, GFR 45-59 ml/min (HCC)   Dysphagia due to recent stroke    Expected Discharge Date: Expected Discharge Date: 10/02/22  Team Members Present: Physician leading conference: Dr. Sula Soda Social Worker Present: Dossie Der, LCSW Nurse Present: Chana Bode, RN PT Present: Wynelle Link, PT OT Present: Bretta Bang, OT SLP Present: Jeannie Done, SLP PPS Coordinator present : Fae Pippin, SLP     Current Status/Progress Goal Weekly Team Focus  Bowel/Bladder   Pt is continent, but can have some incontinent epsiodes. LBM: 9/15   Develop b/b patterns that are normal and consistent.   Assist w/ toileting needs as needed.    Swallow/Nutrition/ Hydration               ADL's   Min assist/ supervision BADL   Supervision/ contact guard   Discharge planning, increase activity tolerance, safety with functional mobility    Mobility   Nearing goal level. close supervision to CGA for functional mobility using RW; ambulating > 276ft   Supervision  DC planning, general strengthening and endurance, standing balance, family/patient education    Communication   close supervision- CGA for ADLS with rollator, decreased activity tolerance with ADLs   supervision   family ed, DC planning, activity tolerance, global strengthening    Safety/Cognition/ Behavioral Observations  currently supervision  overall for basic tasks   supervision for mildly complex cognitive tasks   mildly complex problem solving/emergent awareness with supervision    Pain   Pt denies pain.   Remain pain free.   Assess pain q shift & PRN.    Skin   stage 1 to back - foam in place. DTI to coccyx - xeroform & foam in place. L knee abrasion w/ gauze in place.   Maintain skin integrity, reposition pt off buttocks q 2 hours, monitor/treat wounds to aid in proper healing  Assess skin q shift & PRN.      Discharge Planning:  Home with wife who works from home and can provide supervision level. Will need family training prior to discharge. Awaiting team's recommedations   Team Discussion: Patient post CVA with flat affect , apathetic demeanor and needs cues for actions.  Patient on target to meet rehab goals: yes, currently needs supervision using a rollator after sit- stand wand stand pivot transfers with supervision. Able to ambulate 690' during a 6 min walk test. Needs supervision for mildly complex tasks.   *See Care Plan and progress notes for long and short-term goals.   Revisions to Treatment Plan:  N/a   Teaching Needs: Safety, medications, dietary modification, transfers, etc.   Current Barriers to Discharge: Decreased caregiver support  Possible Resolutions to Barriers: Family education OP follow up services  Follow up with PCP in 1-2 weeks  Repeat vitamin B12 levels in 6 weeks  Follow-up with neurology     Medical Summary Current Status:  underweight, low protein stores, tachycardia, hypotension, hypokalemia  Barriers to Discharge: Medical stability  Barriers to Discharge Comments: underweight, low protein stores, tachycardia, hypotension, hyerkalemia Possible Resolutions to [employer] Focus: provided dietary education, continue to monitor HR TID, continue to monitor blood pressure TID, BMP repeated and potassium normalized   Continued Need for Acute Rehabilitation Level of Care:  The patient requires daily medical management by a physician with specialized training in physical medicine and rehabilitation for the following reasons: Direction of a multidisciplinary physical rehabilitation program to maximize functional independence : Yes Medical management of patient stability for increased activity during participation in an intensive rehabilitation regime.: Yes Analysis of laboratory values and/or radiology reports with any subsequent need for medication adjustment and/or medical intervention. : Yes   I attest that I was present, lead the team conference, and concur with the assessment and plan of the team.   Chana Bode B 10/01/2022, 12:00 PM

## 2022-10-01 NOTE — Plan of Care (Signed)

## 2022-10-01 NOTE — Progress Notes (Signed)
Speech Language Pathology Discharge Summary  Patient Details  Name: Derek Oconnell MRN: [id number] Date of Birth: 02/14/1955  Date of Discharge from SLP service:October 01, 2022  Today's Date: 10/01/2022 SLP Individual Time: 0800-0900 SLP Individual Time Calculation (min): 60 min  Skilled Therapeutic Interventions:  SLP conducted skilled therapy session targeting cognitive retraining goals. SLP and patient discussed plan to discharge tomorrow with patient indicating excitement to be back in his familiar environment. SLP conducted repeated cognitive testing to assess for any remaining cognitive deficits with patient scoring with average range for all tested domains with exception of visuospatial skills. During this visuospatial/constructional task, it should be noted that patient eventually achieved correct answers in 3/3 opportunities independently, however required increased time, thus scored 0 per testing parameters. SLP and patient reviewed WRAP memory strategies with patient recalling given supervisionA. SLP and patient reviewed current medications with patient requiring supervisionA to recall purpose and timing of medications. SLP provided patient with visual aid to assist with recall of medications as needed. SLP then facilitated verbal problem solving task with patient solving all presented problems adequately with supervisionA. Patient was left in lowered bed with call bell in reach and bed alarm set. SLP will continue to target goals per plan of care.     Patient has met 5 of 5 long term goals.  Patient to discharge at overall Supervision level.  Reasons goals not met:   n/a  Clinical Impression/Discharge Summary: SLP has made excellent progress during CIR admission, meeting 5/5 long term goals set for duration of stay. Patient is currently at an overall supervision level for mildly complex cognitive tasks, and suspect patient is close to baseline level of function. Patient completed  cognitive retesting during most recent session; beyond requiring extra processing time for more complex visuospatial/constructional tasks, patient scored within functional limits across all tested domains. Patient currently demonstrating problem solving of mildly complex problems with 100% accuracy with supervisionA and recalls memory strategies with supervisionA. SLP and patient spoke through specific plans for use of memory aids in the home environment with patient agreeable to initiate practices once home. Patient and family education complete. As patient is likely close to baseline level of functioning, speech therapy services likely not needed at next venue of care. Patient appropriate for discharge from SLP CIR services at this time.    Care Partner:  Caregiver Able to Provide Assistance: Yes  Type of Caregiver Assistance: Cognitive  Recommendation:  None     Equipment: n/a   Reasons for discharge: Discharged from hospital   Patient/Family Agrees with Progress Made and Goals Achieved: Yes   Jeannie Done, M.A., CCC-SLP   Yetta Barre 10/01/2022, 12:40 PM

## 2022-10-01 NOTE — Discharge Instructions (Addendum)
Inpatient Rehab Discharge Instructions  Derek Oconnell Discharge date and time:  10/02/22  Activities/Precautions/ Functional Status: Activity: no lifting, driving, or strenuous exercise till cleared by MD Diet: cardiac diet  Need to drink at least 8 glasses of water daily.  Wound Care: keep wound clean and dry   Functional status:  ___ No restrictions     ___ Walk up steps independently _X__ 24/7 supervision/assistance   ___ Walk up steps with assistance ___ Intermittent supervision/assistance  ___ Bathe/dress independently ___ Walk with walker     ___ Bathe/dress with assistance ___ Walk Independently    ___ Shower independently ___ Walk with assistance    ___ Shower with assistance _X__ No alcohol     ___ Return to work/school ________   Special Instructions: Family needs to assist with medication management.     COMMUNITY REFERRALS UPON DISCHARGE:      Outpatient: PT  &  OT             Agency: [facility]  [address] [place] [address] Phone: [phone]             Appointment Date/Time:WILL CALL TO SET UP FOLLOW UP APPOINTMENTS  Medical Equipment/Items Ordered:HAS FROM PREVIOUS ADMISSIONS                                                 Agency/Supplier:NA    STROKE/TIA DISCHARGE INSTRUCTIONS SMOKING Cigarette smoking nearly doubles your risk of having a stroke & is the single most alterable risk factor  If you smoke or have smoked in the last 12 months, you are advised to quit smoking for your health. Most of the excess cardiovascular risk related to smoking disappears within a year of stopping. Ask you doctor about anti-smoking medications  Quit Line: 1-800-QUIT NOW Free Smoking Cessation Classes [phone]  CHOLESTEROL Know your levels; limit fat & cholesterol in your diet  Lipid Panel     Component Value Date/Time   CHOL 111 09/20/2022 0841   TRIG 125 09/20/2022 0841   HDL 39 (L) 09/20/2022 0841   CHOLHDL 2.8 09/20/2022 0841    VLDL 25 09/20/2022 0841   LDLCALC 47 09/20/2022 0841     Many patients benefit from treatment even if their cholesterol is at goal. Goal: Total Cholesterol (CHOL) less than 160 Goal:  Triglycerides (TRIG) less than 150 Goal:  HDL greater than 40 Goal:  LDL (LDLCALC) less than 100   BLOOD PRESSURE American Stroke Association blood pressure target is less that 120/80 mm/Hg  Your discharge blood pressure is:  BP: 102/70 Monitor your blood pressure Limit your salt and alcohol intake Many individuals will require more than one medication for high blood pressure  DIABETES (A1c is a blood sugar average for last 3 months) Goal HGBA1c is under 7% (HBGA1c is blood sugar average for last 3 months)  Diabetes: No known diagnosis of diabetes    Lab Results  Component Value Date   HGBA1C 5.1 09/19/2022    Your HGBA1c can be lowered with medications, healthy diet, and exercise. Check your blood sugar as directed by your physician Call your physician if you experience unexplained or low blood sugars.  PHYSICAL ACTIVITY/REHABILITATION Goal is 30 minutes at least 4 days per week  Activity: No driving, Therapies: see above Return to work: N/A Activity decreases your risk  of heart attack and stroke and makes your heart stronger.  It helps control your weight and blood pressure; helps you relax and can improve your mood. Participate in a regular exercise program. Talk with your doctor about the best form of exercise for you (dancing, walking, swimming, cycling).  DIET/WEIGHT Goal is to maintain a healthy weight  Your discharge diet is:  Diet Order             Diet regular Fluid consistency: Thin  Diet effective 1000                   liquids Your height is:    Your current weight is: Weight: 69.8 kg Your Body Mass Index (BMI) is:  BMI (Calculated): 22.07 Following the type of diet specifically designed for you will help prevent another stroke. You are at goal weight     Your goal Body Mass  Index (BMI) is 19-24. Healthy food habits can help reduce 3 risk factors for stroke:  High cholesterol, hypertension, and excess weight.  RESOURCES Stroke/Support Group:  Call [phone]   STROKE EDUCATION PROVIDED/REVIEWED AND GIVEN TO PATIENT Stroke warning signs and symptoms How to activate emergency medical system (call 911). Medications prescribed at discharge. Need for follow-up after discharge. Personal risk factors for stroke. Pneumonia vaccine given:  Flu vaccine given:  My questions have been answered, the writing is legible, and I understand these instructions.  I will adhere to these goals & educational materials that have been provided to me after my discharge from the hospital.     My questions have been answered and I understand these instructions. I will adhere to these goals and the provided educational materials after my discharge from the hospital.  Patient/Caregiver Signature _______________________________ Date __________  Clinician Signature _______________________________________ Date __________  Please bring this form and your medication list with you to all your follow-up doctor's appointments.

## 2022-10-01 NOTE — Plan of Care (Signed)
  Problem: Consults Goal: RH STROKE PATIENT EDUCATION Description: See Patient Education module for education specifics  Outcome: Progressing   Problem: RH BOWEL ELIMINATION Goal: RH STG MANAGE BOWEL WITH ASSISTANCE Description: STG Manage Bowel with toileting Assistance. Outcome: Progressing Goal: RH STG MANAGE BOWEL W/MEDICATION W/ASSISTANCE Description: STG Manage Bowel with Medication with mod I Assistance. Outcome: Progressing   Problem: RH BLADDER ELIMINATION Goal: RH STG MANAGE BLADDER WITH ASSISTANCE Description: STG Manage Bladder With toileting Assistance Outcome: Progressing   Problem: RH SKIN INTEGRITY Goal: RH STG SKIN FREE OF INFECTION/BREAKDOWN Description: Manage w min assist Outcome: Progressing Goal: RH STG MAINTAIN SKIN INTEGRITY WITH ASSISTANCE Description: STG Maintain Skin Integrity With Assistance. Outcome: Progressing Goal: RH STG ABLE TO PERFORM INCISION/WOUND CARE W/ASSISTANCE Description: STG Able To Perform Incision/Wound Care With min Assistance. Outcome: Progressing   Problem: RH SAFETY Goal: RH STG ADHERE TO SAFETY PRECAUTIONS W/ASSISTANCE/DEVICE Description: STG Adhere to Safety Precautions With Assistance/Device. Outcome: Progressing   Problem: RH KNOWLEDGE DEFICIT Goal: RH STG INCREASE KNOWLEDGE OF HYPERTENSION Description: Patient and wife will be able to manage HTN with medications and dietary modifications using educational resources independently Outcome: Progressing Goal: RH STG INCREASE KNOWLEDGE OF DYSPHAGIA/FLUID INTAKE Description: Patient and wife will be able to manage dysphagia, medications and dietary modifications using educational resources independently Outcome: Progressing Goal: RH STG INCREASE KNOWLEGDE OF HYPERLIPIDEMIA Description: Patient and wife will be able to manage HLD with medications and dietary modifications using educational resources independently Outcome: Progressing Goal: RH STG INCREASE KNOWLEDGE OF  STROKE PROPHYLAXIS Description: Patient and wife will be able to manage secondary risks with medications and dietary modifications using educational resources independently Outcome: Progressing

## 2022-10-01 NOTE — Progress Notes (Signed)
Inpatient Rehabilitation Care Coordinator Discharge Note   Patient Details  Name: Derek Oconnell MRN: [id number] Date of Birth: 11/02/1955   Discharge location: HOME WITH WIFE WHO WORKS FROM HOME AND CAN PROVIDE 24/7 SUPERVISION  Length of Stay: 8 DAYS  Discharge activity level: SUPERVISION LEVEL  Home/community participation: [facility]  Patient response WG:NFAOZH Literacy - How often do you need to have someone help you when you read instructions, pamphlets, or other written material from your doctor or pharmacy?: Never  Patient response YQ:MVHQIO Isolation - How often do you feel lonely or isolated from those around you?: Never  Services provided included: MD, RD, PT, OT, SLP, RN, CM, Pharmacy, SW  Financial Services:  Financial Services Utilized: Medicare    Choices offered to/list presented to: PT AND WIFE  Follow-up services arranged:  Outpatient    Outpatient Servicies: [facility] PT & OT WILL CALL TO SET UP FOLLOW UP APPOINTMENTS  HAS EQUIPMENT FROM PREVIOUS ADMISSIONS. DID NOT WANT RESOURCES FOR SUBSTANCE ABUSE IN [place]    Patient response to transportation need: Is the patient able to respond to transportation needs?: Yes In the past 12 months, has lack of transportation kept you from medical appointments or from getting medications?: No In the past 12 months, has lack of transportation kept you from meetings, work, or from getting things needed for daily living?: No   Patient/Family verbalized understanding of follow-up arrangements:  Yes  Individual responsible for coordination of the follow-up plan: SELF AND KATHYRN-WIFE [phone]  Confirmed correct DME delivered: Lucy Chris 10/01/2022    Comments (or additional information):WIFE WAS HERE AND OBSERVED PT IN THERAPIES. FEELS COMFORTABLE WITH CARE NEEDS.   Summary of Stay    Date/Time Discharge Planning CSW  09/30/22 [phone] Home with wife who works from home and can provide  supervision level. Will need family training prior to discharge. Awaiting team's recommedations RGD       Phelicia Dantes, Lemar Livings

## 2022-10-01 NOTE — Progress Notes (Signed)
PROGRESS NOTE   Subjective/Complaints: No new complaints this morning Excited to go home tomorrow Patient's chart reviewed- No issues reported overnight Vitals signs stable    ROS: feels full from tube feeds, denies pain, +constipation   Objective:   No results found. No results for input(s): "WBC", "HGB", "HCT", "PLT" in the last 72 hours.  Recent Labs    09/29/22 1321  NA 141  K 4.5  CL 99  CO2 24  GLUCOSE 128*  BUN 40*  CREATININE 1.39*  CALCIUM 9.4    Intake/Output Summary (Last 24 hours) at 10/01/2022 0957 Last data filed at 10/01/2022 0804 Gross per 24 hour  Intake 482 ml  Output 200 ml  Net 282 ml     Pressure Injury 09/15/22 Vertebral column Mid Stage 1 -  Intact skin with non-blanchable redness of a localized area usually over a bony prominence. stage 1 mid back (Active)  09/15/22 2130  Location: Vertebral column  Location Orientation: Mid  Staging: Stage 1 -  Intact skin with non-blanchable redness of a localized area usually over a bony prominence.  Wound Description (Comments): stage 1 mid back  Present on Admission: Yes     Pressure Injury 09/15/22 Buttocks Right;Left;Lower Deep Tissue Pressure Injury - Purple or maroon localized area of discolored intact skin or blood-filled blister due to damage of underlying soft tissue from pressure and/or shear. DTI sacrum (Active)  09/15/22 2130  Location: Buttocks  Location Orientation: Right;Left;Lower  Staging: Deep Tissue Pressure Injury - Purple or maroon localized area of discolored intact skin or blood-filled blister due to damage of underlying soft tissue from pressure and/or shear.  Wound Description (Comments): DTI sacrum  Present on Admission: Yes    Physical Exam: Vital Signs Blood pressure 102/70, pulse 97, temperature 97.7 F (36.5 C), resp. rate 16, weight 69.8 kg, SpO2 98%. Gen: no distress, normal appearing HEENT: oral mucosa pink  and moist, NCAT Cardio: Reg rate Chest: normal effort, normal rate of breathing Abd: soft, non-distended Ext: no edema Psych: pleasant, normal affect Psych: pleasant, flat affect  Neurological:     Mental Status: He is alert and oriented to person, place, and time.     Comments: Alert and oriented to name, place, month, year, date, missed day by one. Reasonable insight and awareness. Mild dysphonia. He was able to follow simple commands without difficulty. Slow to process and easily distracted. RUE 4+/5. LUE 4+/5 prox to distal. RLE 4-/5 prox to 4/5 distally. LLE 3+/5 prox to 4/5 distally. No focal sensory findings. Normal muscle tone.  Ambulating CG with RW Psychiatric:     Comments: Pleasant, a little eccentric but cooperative   Assessment/Plan: 1. Functional deficits which require 3+ hours per day of interdisciplinary therapy in a comprehensive inpatient rehab setting. Physiatrist is providing close team supervision and 24 hour management of active medical problems listed below. Physiatrist and rehab team continue to assess barriers to discharge/monitor patient progress toward functional and medical goals  Care Tool:  Bathing    Body parts bathed by patient: Right arm, Left arm, Chest, Abdomen, Front perineal area, Right upper leg, Left upper leg, Face, Buttocks, Left lower leg, Right lower leg  Body parts bathed by helper: Buttocks, Left lower leg, Right lower leg     Bathing assist Assist Level: Supervision/Verbal cueing     Upper Body Dressing/Undressing Upper body dressing   What is the patient wearing?: Pull over shirt    Upper body assist Assist Level: Independent    Lower Body Dressing/Undressing Lower body dressing      What is the patient wearing?: Underwear/pull up, Pants     Lower body assist Assist for lower body dressing: Independent with assitive device     Toileting Toileting    Toileting assist Assist for toileting: Supervision/Verbal cueing      Transfers Chair/bed transfer  Transfers assist     Chair/bed transfer assist level: Supervision/Verbal cueing     Locomotion Ambulation   Ambulation assist      Assist level: Supervision/Verbal cueing Assistive device: Rollator Max distance: 150'   Walk 10 feet activity   Assist     Assist level: Supervision/Verbal cueing Assistive device: Rollator   Walk 50 feet activity   Assist Walk 50 feet with 2 turns activity did not occur: Safety/medical concerns (fatigue)  Assist level: Supervision/Verbal cueing Assistive device: Rollator    Walk 150 feet activity   Assist Walk 150 feet activity did not occur: Safety/medical concerns  Assist level: Supervision/Verbal cueing Assistive device: Rollator    Walk 10 feet on uneven surface  activity   Assist Walk 10 feet on uneven surfaces activity did not occur: Safety/medical concerns   Assist level: Contact Guard/Touching assist Assistive device: Rollator   Wheelchair     Assist Is the patient using a wheelchair?: No Type of Wheelchair: Manual    Wheelchair assist level: Maximal Assistance - Patient 25 - 49% Max wheelchair distance: 31ft    Wheelchair 50 feet with 2 turns activity    Assist        Assist Level: Moderate Assistance - Patient 50 - 74%   Wheelchair 150 feet activity     Assist      Assist Level: Total Assistance - Patient < 25%   Blood pressure 102/70, pulse 97, temperature 97.7 F (36.5 C), resp. rate 16, weight 69.8 kg, SpO2 98%.   Medical Problem List and Plan: 1. 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  Antithrombotics: -DVT/anticoagulation:  Pharmaceutical: Lovenox             -antiplatelet therapy: ASA/ 3. Pain Management: Tylenol prn.  4. Mood/Behavior/Sleep: LCSW to follow for  evaluation and support.              -antipsychotic agents: N/A             -wife is interested in neuropsych counseling for ?depression 5. Neuropsych/cognition: This patient is not fully capable of making decisions on his own behalf. 6. Skin/Wound Care: Air mattress for pressure relief and management of MASD/DTPI             --tube feeds and Prostat for malnutrition.              --Vitamin C added.  7. Fluids/Electrolytes/Nutrition: Monitor I/O. Continue tube feeds at nights             --monitor for signs of overload.  8. Aspiration PNA:  Sputum positive for Staph Lugdunensis. Augmentin thru 09/14 to complete 7 day course. 9. ETOH abuse w/withdrawal seizures: wife interested in ETOH counseling.              -discussed that majority of etoh counseling would be after his rehab stay 10. Acute on chronic anemia: Hgb has trended down form mid 8's to 6.9 on 9/12-->transfused with 1 unit PRBC             --will order FOBT and monitor for other signs of bleeding.  11.  Sever thiamine deficiency @ 1.1: Supplemented parentally X 5 days. Continue oral supplement.  12. H/o Chronic HFrEF w/recovery. S/p BIV ICD: Continues to have soft BP limiting GDMT.             --strict I/O. Daily wts to monitor for signs of overload. continue ASA/Statin.             --continue to hold Lisinopril and metoprolol. Monitor HR/for symptoms with increase with activity.  13. PAD s/p stent/aortobifemoral BG: On ASA and statin. Off Plavix due to low platelets/anemia.  14. Macrocytic anemia/ Low normal B12 @ 258: Started on supplement today. .   15. Dysphagia/chronic anorexia: Per family eats once a day.  Diet upgraded to regular             --continue on tube feeds at nights.  Ate 100% breakfast , will see what lunch intake is today  Will hold TF at noc if meal intake >50% for lunch and dinner  16. CKD?: SCr- 2.16 @ admission. Hyperkalemia today treated with lokelma --will change tube feeds to nephro due to worsening of  renal status-    Latest Ref Rng & Units 09/29/2022    1:21 PM 09/28/2022    6:38 AM 09/27/2022    8:25 AM  BMP  Glucose 70 - 99 mg/dL 161  096  045   BUN 8 - 23 mg/dL 40  38  40   Creatinine 0.61 - 1.24 mg/dL 4.09  8.11  9.14   Sodium 135 - 145 mmol/L 141  136  135   Potassium 3.5 - 5.1 mmol/L 4.5  4.4  5.1   Chloride 98 - 111 mmol/L 99  103  106   CO2 22 - 32 mmol/L 24  24  21    Calcium 8.9 - 10.3 mg/dL 9.4  8.8  8.9    -d/c water flushes. Strict I/O.   From 07/01/21 BUN 12 and Creat 1.59 looks to bee at baseline   17. Underweight: BMI reviewed and is 18.16  18. Phosphorus deficiency: repeat today, will not replete with kphos while potassium is elevated, phosphorus reviewed and has normalized  19. Hyperkalemia: lokelma ordered previously,Resolved, potassium reviewed and has normalized, discussed with patient  20. Vitamin A deficiency: will hold off on supplementation as pills are uncomfortably large for patient to swallow  21. Tachycardia: 2 grams IV mag ordered 9/15, magnesium level reviewed and has normalized, discussed with patient  22. Early satiety: 2/2 TF, d/c TF, discussed with nursing and patient is eating meals well.   23. Low protein stores: educated regarding prioritizing protein in his diet    LOS: 7 days A FACE TO FACE EVALUATION WAS PERFORMED  Drema Pry Kelon Easom 10/01/2022, 9:57 AM

## 2022-10-01 NOTE — Progress Notes (Signed)
Occupational Therapy Discharge Summary  Patient Details  Name: Derek Oconnell MRN: [id number] Date of Birth: March 08, 1955  Date of Discharge from OT service:October 01, 2022  Today's Date: 10/01/2022 OT Individual Time: 2952-8413 OT Individual Time Calculation (min): 71 min  Skilled Therapeutic Intervention:   Completed discharge testing and reviewed plan to have daily routine at home.  Patient excited to leave hospital and has no reservations regarding discharge.  Patient has exceeded most OT goals.  Ambulated to and from gym and completed toileting with mod I.  Patient made Mod I after discussing with interdisciplinary team.  Left in bed with personal items nearby.    Patient has met 10 of 10 long term goals due to improved activity tolerance, improved balance, postural control, ability to compensate for deficits, improved attention, and improved awareness.  Patient to discharge at overall Supervision level.  Patient's care partner is independent to provide the necessary cognitive and physical assistance at discharge.    Reasons goals not met: NA  Recommendation:  Patient will benefit from ongoing skilled OT services in outpatient setting to continue to advance functional skills in the area of BADL, iADL, and Reduce care partner burden.  Equipment: No equipment provided  Reasons for discharge: treatment goals met and discharge from hospital  Patient/family agrees with progress made and goals achieved: Yes  OT Discharge Precautions/Restrictions  Precautions Precautions: Fall Precaution Comments: ETOH abuse Restrictions Weight Bearing Restrictions: No   Pain Pain Assessment Pain Score: 0-No pain ADL ADL Eating: Independent Where Assessed-Eating: Chair Grooming: Modified independent Where Assessed-Grooming: Standing at sink Upper Body Bathing: Modified independent Where Assessed-Upper Body Bathing: Shower Lower Body Bathing: Modified independent Where Assessed-Lower Body  Bathing: Shower Upper Body Dressing: Independent Where Assessed-Upper Body Dressing: Edge of bed Lower Body Dressing: Modified independent Where Assessed-Lower Body Dressing: Chair, Edge of bed Toileting: Modified independent Where Assessed-Toileting: Teacher, adult education: Engineer, agricultural Method: Insurance claims handler: Close supervison Web designer Method: Ship broker: Information systems manager with back Film/video editor: Modified independent Film/video editor Method: Designer, industrial/product: Information systems manager with back ADL Comments: Poor historian related to prior level of activity Vision Baseline Vision/History: 0 No visual deficits;1 Wears glasses Patient Visual Report: No change from baseline Vision Assessment?: No apparent visual deficits Additional Comments: Reports on discharge that he had periods of bluriness - now clearer Perception  Perception: Within Functional Limits Praxis Praxis: WFL Cognition Cognition Overall Cognitive Status: Impaired/Different from baseline Arousal/Alertness: Awake/alert Orientation Level: Person;Place;Situation Person: Oriented Place: Oriented Situation: Oriented Memory: Impaired Memory Impairment: Retrieval deficit;Decreased recall of new information Decreased Short Term Memory: Functional basic;Functional complex Attention: Selective Focused Attention: Appears intact Sustained Attention: Appears intact Sustained Attention Impairment: Functional basic Selective Attention: Impaired Selective Attention Impairment: Functional complex Awareness: Appears intact Awareness Impairment: Anticipatory impairment Problem Solving: Appears intact Problem Solving Impairment: Functional basic Executive Function: Initiating Initiating: Impaired Initiating Impairment: Functional basic Behaviors: Other (comment) Safety/Judgment: Appears intact Brief Interview for Mental Status  (BIMS) Repetition of Three Words (First Attempt): 3 Temporal Orientation: Year: Correct Temporal Orientation: Month: Accurate within 5 days Temporal Orientation: Day: Correct Recall: "Sock": Yes, no cue required Recall: "Blue": Yes, no cue required Recall: "Bed": Yes, no cue required BIMS Summary Score: 15 Sensation Sensation Light Touch: Appears Intact Hot/Cold: Appears Intact Proprioception: Appears Intact Stereognosis: Appears Intact Coordination Gross Motor Movements are Fluid and Coordinated: Yes Fine Motor Movements are Fluid and Coordinated: Yes Coordination and Movement Description: General deconditioning 9 Hole Peg Test: R: 41.38  L:  43.49 Motor  Motor Motor: Abnormal postural alignment and control;Other (comment) Motor - Skilled Clinical Observations: Generalized weakness and deconditioning Motor - Discharge Observations: Generalized weakness and deconditioning Mobility  Bed Mobility Bed Mobility: Rolling Right;Rolling Left;Supine to Sit;Sit to Supine Rolling Right: Independent Rolling Left: Independent Supine to Sit: Independent Sit to Supine: Independent Transfers Sit to Stand: Independent with assistive device Stand to Sit: Independent with assistive device  Trunk/Postural Assessment  Cervical Assessment Cervical Assessment: Exceptions to [facility] Thoracic Assessment Thoracic Assessment: Exceptions to [facility] Lumbar Assessment Lumbar Assessment: Exceptions to [facility] Postural Control Postural Control: Within Functional Limits  Balance Balance Balance Assessed: Yes Static Sitting Balance Static Sitting - Balance Support: Feet supported;No upper extremity supported Static Sitting - Level of Assistance: 7: Independent Dynamic Sitting Balance Dynamic Sitting - Balance Support: During functional activity;Feet supported Dynamic Sitting - Level of Assistance: 7: Independent Static Standing Balance Static Standing - Balance Support: Right upper extremity  supported;Left upper extremity supported;During functional activity Static Standing - Level of Assistance: 6: Modified independent (Device/Increase time) Dynamic Standing Balance Dynamic Standing - Balance Support: Right upper extremity supported;Left upper extremity supported;During functional activity Dynamic Standing - Level of Assistance: 5: Stand by assistance Extremity/Trunk Assessment RUE Assessment RUE Assessment: Within Functional Limits General Strength Comments: 4/5 Overall.  Grip strength R/L= 40LBS LUE Assessment LUE Assessment: Within Functional Limits General Strength Comments: 4/5 Overall. Grip strength R/L= 40LBS   [name] 10/01/2022, 10:58 AM

## 2022-10-01 NOTE — Progress Notes (Signed)
Physical Therapy Discharge Summary  Patient Details  Name: Derek Oconnell MRN: [id number] Date of Birth: 02-12-1955  Date of Discharge from PT service:October 01, 2022  Today's Date: 10/01/2022 PT Individual Time: 1320-1415 PT Individual Time Calculation (min): 55 min    Patient has met 8 of 8 long term goals due to improved activity tolerance, improved balance, improved postural control, ability to compensate for deficits, improved attention, and improved awareness.  Patient to discharge at an ambulatory level Modified Independent.   Patient's care partner is independent to provide the necessary physical and cognitive assistance at discharge.  Reasons goals not met: n/a  Recommendation:  Patient will benefit from ongoing skilled PT services in outpatient setting to continue to advance safe functional mobility, address ongoing impairments in general strengthening and endurance, balance training, and minimize fall risk.  Equipment: No equipment provided - pt owns all necessary DME  Reasons for discharge: treatment goals met and discharge from hospital  Patient/family agrees with progress made and goals achieved: Yes  PT Discharge Precautions/Restrictions Precautions Precautions: Fall Precaution Comments: ETOH abuse Restrictions Weight Bearing Restrictions: No Pain Interference Pain Interference Pain Effect on Sleep: 0. Does not apply - I have not had any pain or hurting in the past 5 days Pain Interference with Therapy Activities: 0. Does not apply - I have not received rehabilitationtherapy in the past 5 days Pain Interference with Day-to-Day Activities: 1. Rarely or not at all Vision/Perception  Vision - History Ability to See in Adequate Light: 0 Adequate Perception Perception: Within Functional Limits Praxis Praxis: WFL  Cognition Overall Cognitive Status: Impaired/Different from baseline Arousal/Alertness: Awake/alert Orientation Level: Oriented X4 Attention:  Focused;Sustained Focused Attention: Appears intact Sustained Attention: Appears intact Memory: Impaired Memory Impairment: Retrieval deficit;Decreased recall of new information Decreased Short Term Memory: Functional basic;Functional complex Awareness: Impaired Awareness Impairment: Anticipatory impairment Problem Solving: Appears intact Executive Function: Initiating Initiating: Impaired Initiating Impairment: Functional basic Behaviors: Other (comment) (learned helplessness) Safety/Judgment: Appears intact Sensation Sensation Light Touch: Appears Intact Hot/Cold: Appears Intact Proprioception: Appears Intact Stereognosis: Appears Intact Coordination Gross Motor Movements are Fluid and Coordinated: Yes Coordination and Movement Description: General deconditioning Motor  Motor Motor: Abnormal postural alignment and control;Other (comment) Motor - Skilled Clinical Observations: Generalized weakness and deconditioning  Mobility Bed Mobility Bed Mobility: Rolling Right;Rolling Left;Supine to Sit;Sit to Supine Rolling Right: Independent Rolling Left: Independent Supine to Sit: Independent Sit to Supine: Independent Transfers Transfers: Sit to Stand;Stand to Sit;Stand Pivot Transfers Sit to Stand: Independent with assistive device Stand to Sit: Independent with assistive device Stand Pivot Transfers: Independent with assistive device Transfer (Assistive device): Rollator Locomotion  Gait Ambulation: Yes Gait Assistance: Independent with assistive device Gait Distance (Feet): 700 Feet Assistive device: Rollator Gait Gait: Yes Gait Pattern: Impaired Gait Pattern: Step-through pattern;Decreased stride length;Decreased hip/knee flexion - right;Decreased hip/knee flexion - left;Wide base of support;Trunk flexed Gait velocity: 0.58 m/s (from ) Stairs / Additional Locomotion Stairs: Yes Stairs Assistance: Independent with assistive device Stair Management Technique: Two  rails;Forwards;Step to pattern Number of Stairs: 12 Height of Stairs: 6 Ramp: Independent with assistive device Wheelchair Mobility Wheelchair Mobility: No  Trunk/Postural Assessment  Cervical Assessment Cervical Assessment: Exceptions to [facility] (forward head) Thoracic Assessment Thoracic Assessment: Exceptions to [facility] (rounded shoulders; curvature of spine) Lumbar Assessment Lumbar Assessment: Exceptions to [facility] (posterior tilt) Postural Control Postural Control: Within Functional Limits  Balance Balance Balance Assessed: Yes Static Sitting Balance Static Sitting - Balance Support: Feet supported;No upper extremity supported Static Sitting - Level of Assistance: 7:  Independent Dynamic Sitting Balance Dynamic Sitting - Balance Support: During functional activity;Feet supported Dynamic Sitting - Level of Assistance: 7: Independent Static Standing Balance Static Standing - Balance Support: Right upper extremity supported;Left upper extremity supported;During functional activity Static Standing - Level of Assistance: 6: Modified independent (Device/Increase time) Dynamic Standing Balance Dynamic Standing - Balance Support: Right upper extremity supported;Left upper extremity supported;During functional activity Dynamic Standing - Level of Assistance: 5: Stand by assistance Extremity Assessment      RLE Assessment RLE Assessment: Exceptions to [facility] Strength Comments: Grossly 4/5 LLE Assessment LLE Assessment: Exceptions to [facility] General Strength Comments: Grossly 4/5  Treatment: Pt lying in bed to start - agreeable to therapy session. No c/o pain, just generalized fatigue. Bed mobility completed mod I. Donned tennis shoes with setupA while seated EOB. Sit<>stand to rollator mod I - able to lock and unlock brakes without cueing. Pt ambulates mod I level using his rollator from his room to main rehab gym. Completes stair training x12 steps (6") mod I with 2 hand rails and  reciprocal stepping for both directions. Pt instructed in car transfer with car height simulating their personal vehicle - pt completing with setupA for rollator management only. Ambulated from CIR unit, outside (standing rest break in elevator) at mod I level using rollator. While outdoors, practiced community mobility on unlevel paved terrain, mod I with rollator and adequate safety awareness while navigating cracks, slopes, and unfamiliar environment. While outdoors, discussed home safety, f/u therapy, DME, fall prevention. Pt reports confidence in returning home and is looking forward to being back where he's familiar. Pt ambulated back upstairs to CIR floor mod I with rollator, needing directional cues for locating his room. Pt concluded session seated EOB, call bell in reach, all needs met.   Sopheap Basic P Rae Sutcliffe  PT, DPT, CSRS  10/01/2022, 7:57 AM

## 2022-10-02 ENCOUNTER — Other Ambulatory Visit (HOSPITAL_COMMUNITY): Payer: Self-pay

## 2022-10-02 DIAGNOSIS — F1010 Alcohol abuse, uncomplicated: Secondary | ICD-10-CM | POA: Insufficient documentation

## 2022-10-02 MED ORDER — ASPIRIN 81 MG PO TBEC: 81.0000 mg | DELAYED_RELEASE_TABLET | Freq: Every day | ORAL | Status: AC

## 2022-10-02 MED ORDER — VITAMIN B-12 100 MCG PO TABS: 100.0000 ug | ORAL_TABLET | Freq: Every day | ORAL | 3 refills | Status: AC

## 2022-10-02 NOTE — Progress Notes (Signed)
PROGRESS NOTE   Subjective/Complaints: No new complaints this morning Ready for d/c today  Wife asks about medications Discussed that minerals and electrolyte levels have improved  ROS: feels full from tube feeds, denies pain, +constipation   Objective:   No results found. No results for input(s): "WBC", "HGB", "HCT", "PLT" in the last 72 hours.  Recent Labs    09/29/22 1321  NA 141  K 4.5  CL 99  CO2 24  GLUCOSE 128*  BUN 40*  CREATININE 1.39*  CALCIUM 9.4    Intake/Output Summary (Last 24 hours) at 10/02/2022 9563 Last data filed at 10/02/2022 8756 Gross per 24 hour  Intake 1060 ml  Output 400 ml  Net 660 ml     Pressure Injury 09/15/22 Vertebral column Mid Stage 1 -  Intact skin with non-blanchable redness of a localized area usually over a bony prominence. stage 1 mid back (Active)  09/15/22 2130  Location: Vertebral column  Location Orientation: Mid  Staging: Stage 1 -  Intact skin with non-blanchable redness of a localized area usually over a bony prominence.  Wound Description (Comments): stage 1 mid back  Present on Admission: Yes     Pressure Injury 09/15/22 Buttocks Right;Left;Lower Deep Tissue Pressure Injury - Purple or maroon localized area of discolored intact skin or blood-filled blister due to damage of underlying soft tissue from pressure and/or shear. DTI sacrum (Active)  09/15/22 2130  Location: Buttocks  Location Orientation: Right;Left;Lower  Staging: Deep Tissue Pressure Injury - Purple or maroon localized area of discolored intact skin or blood-filled blister due to damage of underlying soft tissue from pressure and/or shear.  Wound Description (Comments): DTI sacrum  Present on Admission: Yes    Physical Exam: Vital Signs Blood pressure 105/72, pulse 94, temperature 97.9 F (36.6 C), resp. rate 16, weight 69.8 kg, SpO2 100%. Gen: no distress, normal appearing HEENT: oral mucosa  pink and moist, NCAT Cardio: Reg rate Chest: normal effort, normal rate of breathing Abd: soft, non-distended Ext: no edema Psych: pleasant, flat affect  Neurological:     Mental Status: He is alert and oriented to person, place, and time.     Comments: Alert and oriented to name, place, month, year, date, missed day by one. Reasonable insight and awareness. Mild dysphonia. He was able to follow simple commands without difficulty. Slow to process and easily distracted. RUE 4+/5. LUE 4+/5 prox to distal. RLE 4-/5 prox to 4/5 distally. LLE 3+/5 prox to 4/5 distally. No focal sensory findings. Normal muscle tone.  Ambulating CG with RW Psychiatric:     Comments: Pleasant, a little eccentric but cooperative   Assessment/Plan: 1. Functional deficits which require 3+ hours per day of interdisciplinary therapy in a comprehensive inpatient rehab setting. Physiatrist is providing close team supervision and 24 hour management of active medical problems listed below. Physiatrist and rehab team continue to assess barriers to discharge/monitor patient progress toward functional and medical goals  Care Tool:  Bathing    Body parts bathed by patient: Right arm, Left arm, Chest, Abdomen, Front perineal area, Right upper leg, Left upper leg, Face, Buttocks, Left lower leg, Right lower leg   Body parts bathed by  helper: Buttocks, Left lower leg, Right lower leg     Bathing assist Assist Level: Supervision/Verbal cueing     Upper Body Dressing/Undressing Upper body dressing   What is the patient wearing?: Pull over shirt    Upper body assist Assist Level: Independent    Lower Body Dressing/Undressing Lower body dressing      What is the patient wearing?: Underwear/pull up, Pants     Lower body assist Assist for lower body dressing: Independent with assitive device     Toileting Toileting    Toileting assist Assist for toileting: Independent with assistive device      Transfers Chair/bed transfer  Transfers assist     Chair/bed transfer assist level: Independent with assistive device Chair/bed transfer assistive device: Geologist, engineering   Ambulation assist      Assist level: Independent with assistive device Assistive device: Rollator Max distance: 700'   Walk 10 feet activity   Assist     Assist level: Independent with assistive device Assistive device: Rollator   Walk 50 feet activity   Assist Walk 50 feet with 2 turns activity did not occur: Safety/medical concerns (fatigue)  Assist level: Independent with assistive device Assistive device: Rollator    Walk 150 feet activity   Assist Walk 150 feet activity did not occur: Safety/medical concerns  Assist level: Independent with assistive device Assistive device: Rollator    Walk 10 feet on uneven surface  activity   Assist Walk 10 feet on uneven surfaces activity did not occur: Safety/medical concerns   Assist level: Independent with assistive device Assistive device: Rollator   Wheelchair     Assist Is the patient using a wheelchair?: No Type of Wheelchair: Manual    Wheelchair assist level: Maximal Assistance - Patient 25 - 49% Max wheelchair distance: 63ft    Wheelchair 50 feet with 2 turns activity    Assist        Assist Level: Moderate Assistance - Patient 50 - 74%   Wheelchair 150 feet activity     Assist      Assist Level: Total Assistance - Patient < 25%   Blood pressure 105/72, pulse 94, temperature 97.9 F (36.6 C), resp. rate 16, weight 69.8 kg, SpO2 100%.   Medical Problem List and Plan: 1. 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  Antithrombotics: -DVT/anticoagulation:  Pharmaceutical: Lovenox             -antiplatelet therapy:  ASA/ 3. Pain Management: Tylenol prn.  4. Mood/Behavior/Sleep: LCSW to follow for evaluation and support.              -antipsychotic agents: N/A             -wife is interested in neuropsych counseling for ?depression 5. Neuropsych/cognition: This patient is not fully capable of making decisions on his own behalf. 6. Skin/Wound Care: Air mattress for pressure relief and management of MASD/DTPI             --tube feeds and Prostat for malnutrition.              --Vitamin C added.  7. Fluids/Electrolytes/Nutrition: Monitor I/O. Continue tube feeds at nights             --  monitor for signs of overload.  8. Aspiration PNA: Sputum positive for Staph Lugdunensis. Augmentin thru 09/14 to complete 7 day course. 9. ETOH abuse w/withdrawal seizures: wife interested in ETOH counseling.              -discussed that majority of etoh counseling would be after his rehab stay 10. Acute on chronic anemia: Hgb has trended down form mid 8's to 6.9 on 9/12-->transfused with 1 unit PRBC             --will order FOBT and monitor for other signs of bleeding.  11.  Sever thiamine deficiency @ 1.1: Supplemented parentally X 5 days. Continue oral supplement.  12. H/o Chronic HFrEF w/recovery. S/p BIV ICD: Continues to have soft BP limiting GDMT.             --strict I/O. Daily wts to monitor for signs of overload. Continue ASA/Statin.             --continue to hold Lisinopril and metoprolol. Monitor HR/for symptoms with increase with activity.  13. PAD s/p stent/aortobifemoral BG: continue ASA and statin. Off Plavix due to low platelets/anemia.  14. Macrocytic anemia/ Low normal B12 @ 258: Started on supplement today. .   15. Dysphagia/chronic anorexia: Per family eats once a day.  Diet upgraded to regular             --continue on tube feeds at nights.  Ate 100% breakfast , will see what lunch intake is today  Will hold TF at noc if meal intake >50% for lunch and dinner  16. CKD?: SCr- 2.16 @ admission.  Hyperkalemia today treated with lokelma --will change tube feeds to nephro due to worsening of renal status-    Latest Ref Rng & Units 09/29/2022    1:21 PM 09/28/2022    6:38 AM 09/27/2022    8:25 AM  BMP  Glucose 70 - 99 mg/dL 811  914  782   BUN 8 - 23 mg/dL 40  38  40   Creatinine 0.61 - 1.24 mg/dL 9.56  2.13  0.86   Sodium 135 - 145 mmol/L 141  136  135   Potassium 3.5 - 5.1 mmol/L 4.5  4.4  5.1   Chloride 98 - 111 mmol/L 99  103  106   CO2 22 - 32 mmol/L 24  24  21    Calcium 8.9 - 10.3 mg/dL 9.4  8.8  8.9    -d/c water flushes. Strict I/O.   From 07/01/21 BUN 12 and Creat 1.59 looks to bee at baseline   17. Underweight: BMI reviewed and is 18.16  18. Phosphorus deficiency: repeat today, will not replete with kphos while potassium is elevated, phosphorus reviewed and has normalized  19. Hyperkalemia: lokelma ordered previously,Resolved, potassium reviewed and has normalized, discussed with patient  20. Vitamin A deficiency: will hold off on supplementation as pills are uncomfortably large for patient to swallow, educated regarding the importance of consuming vegetables and meats  21. Tachycardia: 2 grams IV mag ordered 9/15, magnesium level reviewed and has normalized, discussed with patient  22. Early satiety: 2/2 TF, d/c TF, discussed with nursing and patient is eating meals well.   23. Low protein stores: educated regarding prioritizing protein in his diet, educated wife as well   >30 minutes spent in discharge of patient including review of medications and follow-up appointments, physical examination, and in answering all patient's questions   LOS: 8 days A FACE TO FACE EVALUATION WAS PERFORMED  Drema Pry Jedi Catalfamo 10/02/2022, 9:38 AM

## 2022-10-02 NOTE — Progress Notes (Signed)
Inpatient Rehabilitation Discharge Medication Review by a Pharmacist  A complete drug regimen review was completed for this patient to identify any potential clinically significant medication issues.  High Risk Drug Classes Is patient taking? Indication by Medication  Antipsychotic No   Anticoagulant No   Antibiotic No   Opioid No   Antiplatelet Yes bASA, Plavix - PAD  Hypoglycemics/insulin No   Vasoactive Medication No   Chemotherapy No   Other Yes Atorvastatin - HLD Mirtazapine - mood B6, Folic acid, thiamine - nutritional supplements     Type of Medication Issue Identified Description of Issue Recommendation(s)  Drug Interaction(s) (clinically significant)     Duplicate Therapy     Allergy     No Medication Administration End Date     Incorrect Dose     Additional Drug Therapy Needed     Significant med changes from prior encounter (inform family/care partners about these prior to discharge).    Other       Clinically significant medication issues were identified that warrant physician communication and completion of prescribed/recommended actions by midnight of the next day:  No  Name of provider notified for urgent issues identified:   Provider Method of Notification:    Pharmacist comments:   Time spent performing this drug regimen review (minutes):  15   Rexford Maus, PharmD, BCPS 10/02/2022 8:22 AM

## 2022-10-27 ENCOUNTER — Other Ambulatory Visit: Payer: Self-pay | Admitting: Physical Medicine and Rehabilitation

## 2022-11-02 ENCOUNTER — Encounter: Payer: Medicare Other | Admitting: Physical Medicine and Rehabilitation

## 2023-05-13 IMAGING — CR DG CHEST 2V
1 series · 2 of 2 positions shown · non-contrast
Comparison: None Available.

CLINICAL DATA: Chest pain.

EXAM:
CHEST - 2 VIEW

[Series 1: dg chest 2 view · 0.14mm/px · 2 of 2 slices shown]
[im 1/2]
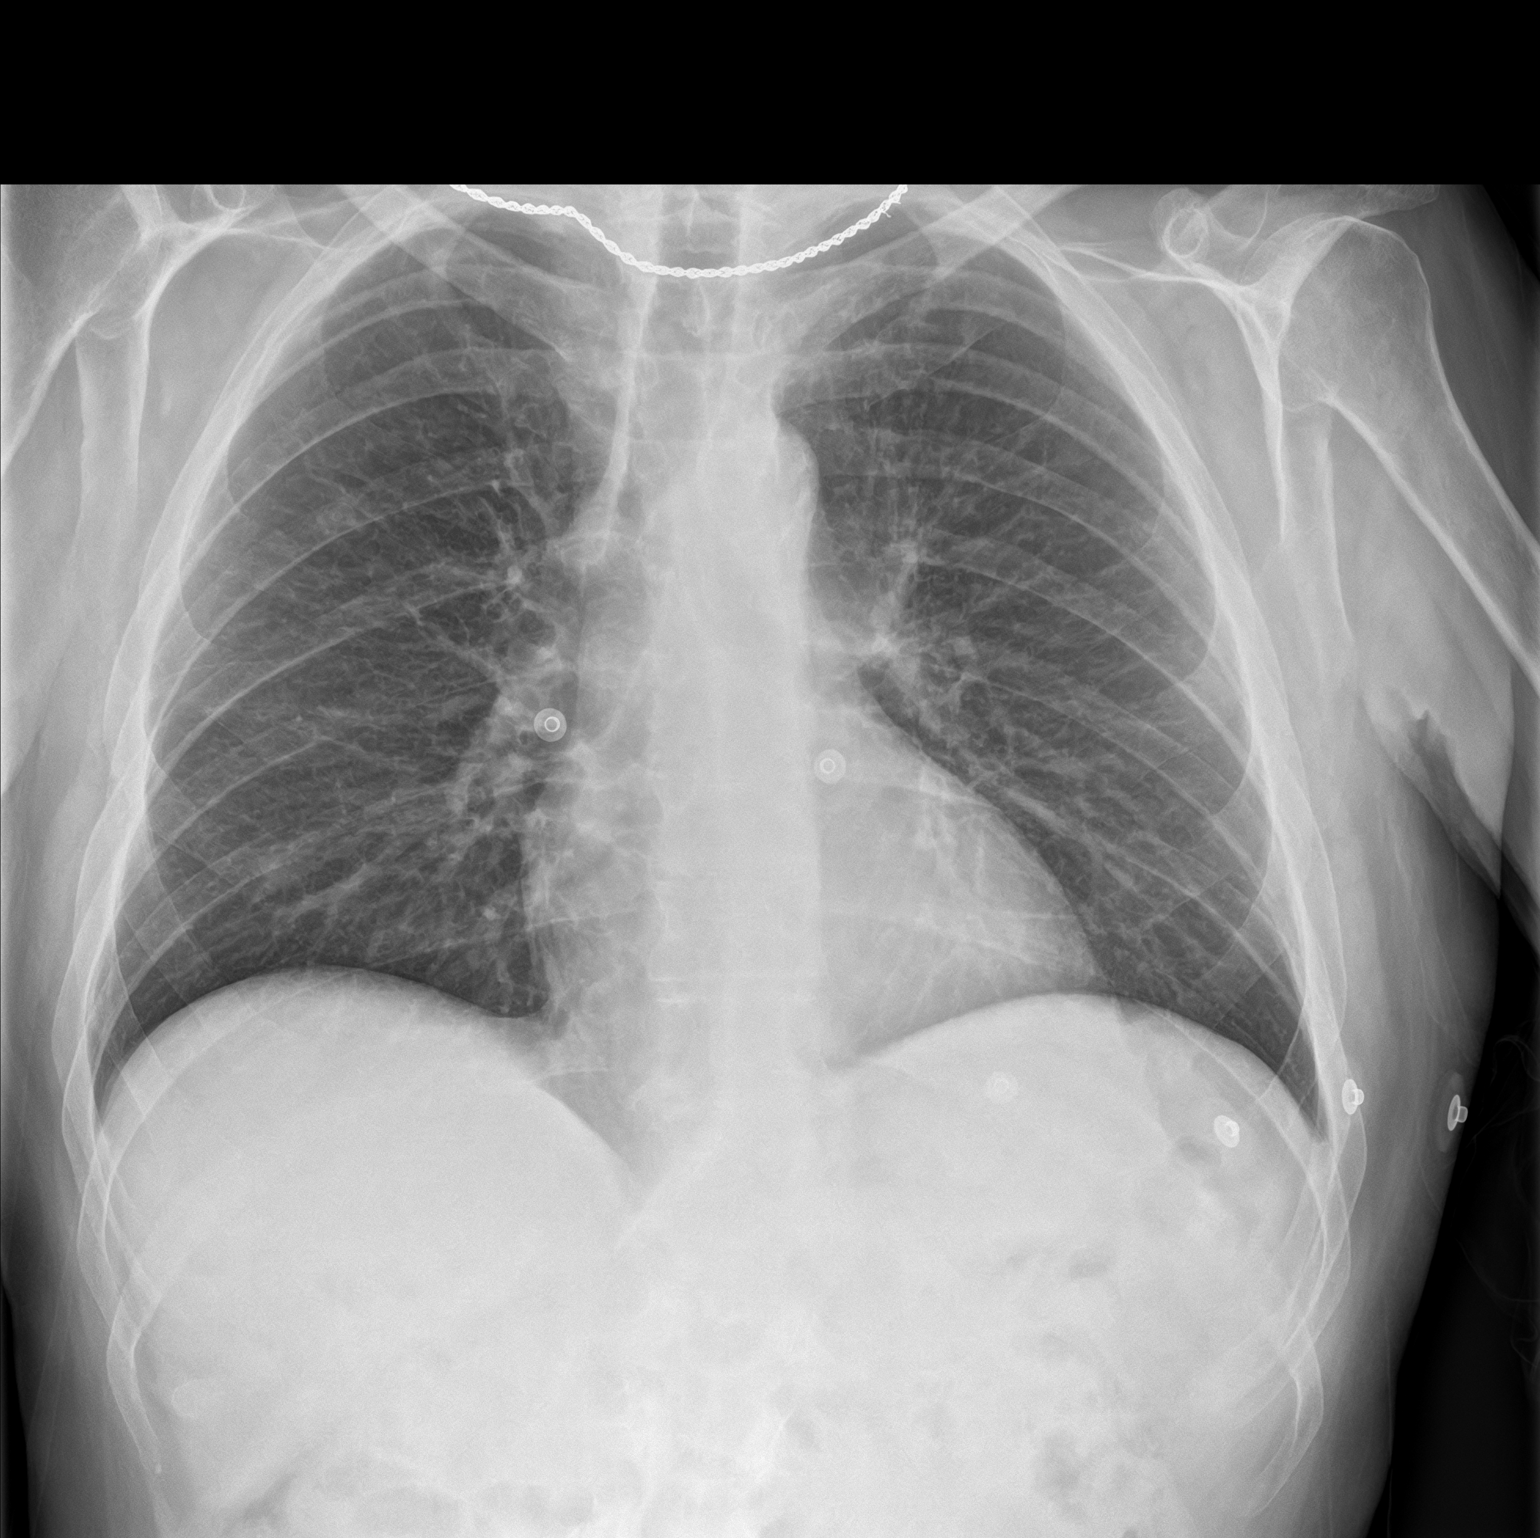
[im 2/2]
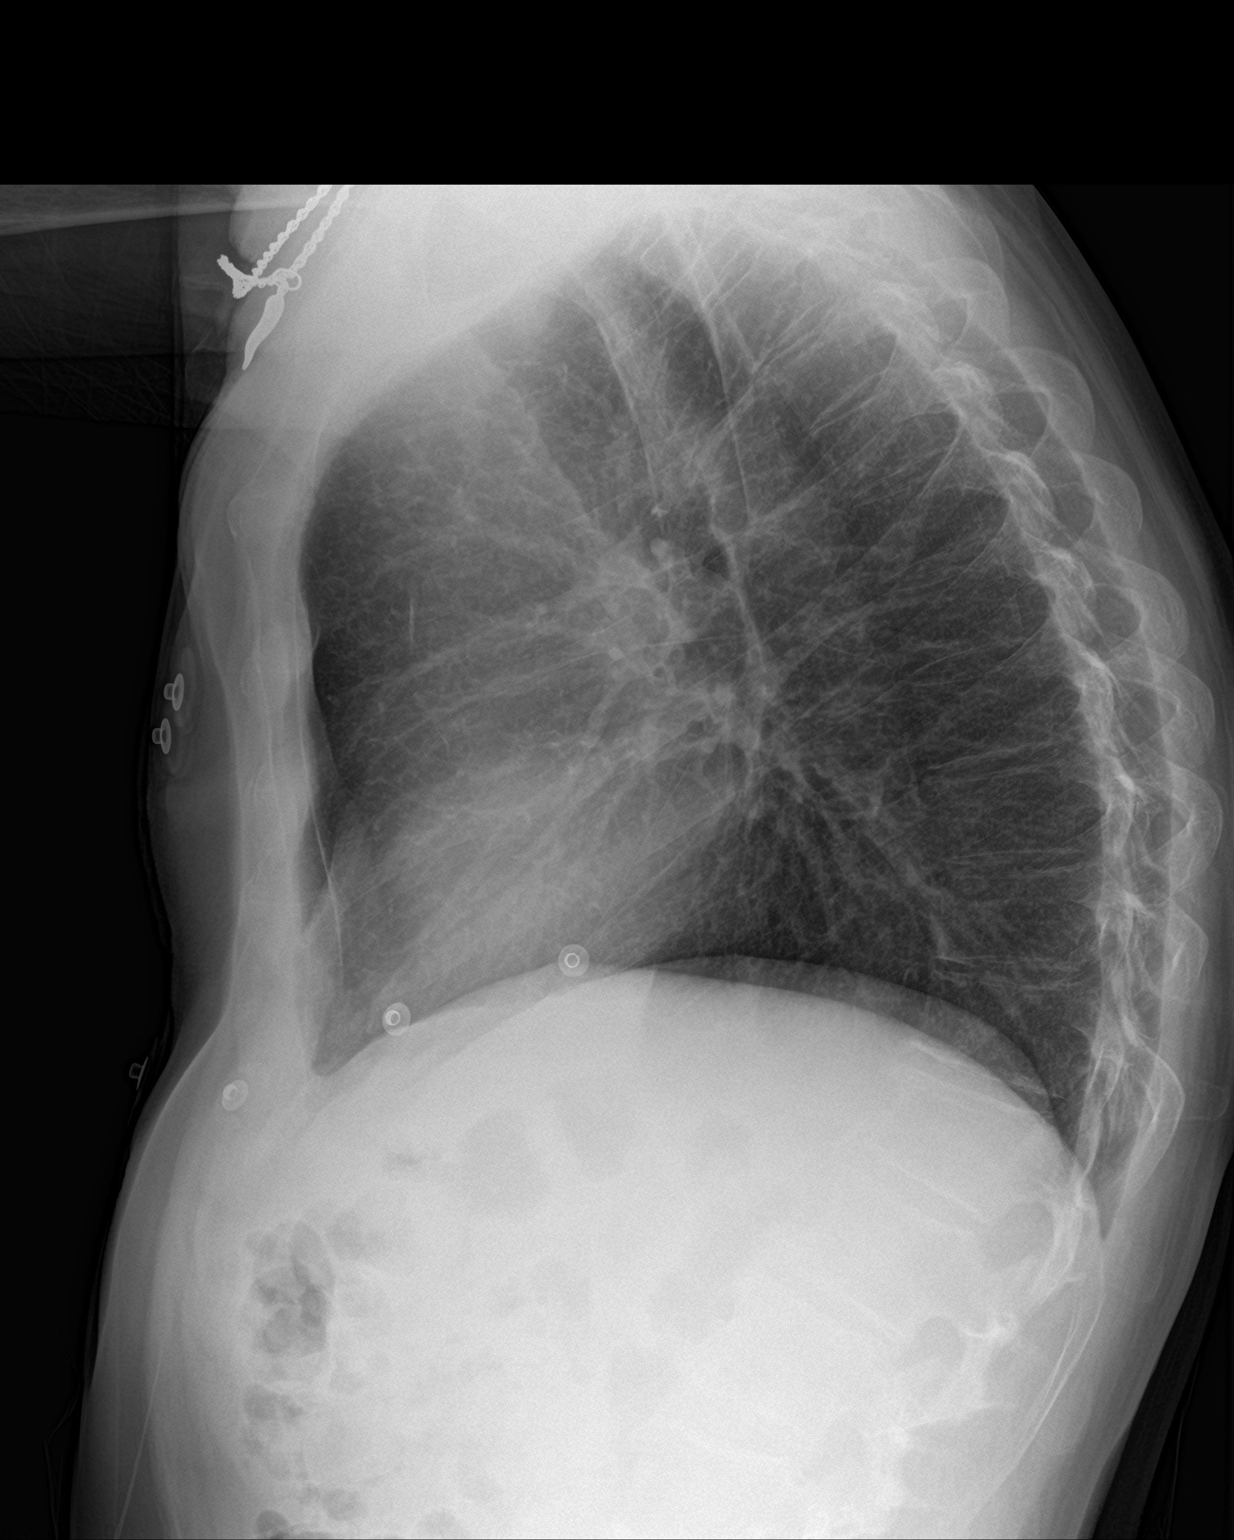

[2 of 2 positions shown; findings below may reference images not displayed]

FINDINGS: Heart size is normal. No focal consolidations or pleural effusions.
No pulmonary edema. There is mild anterior wedging of T11 and T12,
of indeterminate age.
IMPRESSION: No evidence for acute  abnormality.
